# Patient Record
Sex: Female | Born: 1960
Health system: Southern US, Community
[De-identification: ages and names within clinical notes are randomized; demographics above are authoritative.]

## PROBLEM LIST (undated history)

## (undated) DIAGNOSIS — R7303 Prediabetes: Secondary | ICD-10-CM

## (undated) DIAGNOSIS — K602 Anal fissure, unspecified: Secondary | ICD-10-CM

## (undated) DIAGNOSIS — M25512 Pain in left shoulder: Secondary | ICD-10-CM

## (undated) DIAGNOSIS — E049 Nontoxic goiter, unspecified: Secondary | ICD-10-CM

## (undated) DIAGNOSIS — R011 Cardiac murmur, unspecified: Secondary | ICD-10-CM

## (undated) DIAGNOSIS — K8000 Calculus of gallbladder with acute cholecystitis without obstruction: Secondary | ICD-10-CM

## (undated) DIAGNOSIS — D649 Anemia, unspecified: Secondary | ICD-10-CM

## (undated) DIAGNOSIS — J189 Pneumonia, unspecified organism: Secondary | ICD-10-CM

## (undated) DIAGNOSIS — K219 Gastro-esophageal reflux disease without esophagitis: Secondary | ICD-10-CM

## (undated) DIAGNOSIS — K8020 Calculus of gallbladder without cholecystitis without obstruction: Secondary | ICD-10-CM

## (undated) DIAGNOSIS — G709 Myoneural disorder, unspecified: Secondary | ICD-10-CM

## (undated) DIAGNOSIS — M1990 Unspecified osteoarthritis, unspecified site: Secondary | ICD-10-CM

## (undated) DIAGNOSIS — J45909 Unspecified asthma, uncomplicated: Secondary | ICD-10-CM

## (undated) DIAGNOSIS — G4730 Sleep apnea, unspecified: Secondary | ICD-10-CM

## (undated) DIAGNOSIS — E162 Hypoglycemia, unspecified: Secondary | ICD-10-CM

## (undated) DIAGNOSIS — F988 Other specified behavioral and emotional disorders with onset usually occurring in childhood and adolescence: Secondary | ICD-10-CM

## (undated) DIAGNOSIS — I10 Essential (primary) hypertension: Secondary | ICD-10-CM

## (undated) DIAGNOSIS — R0600 Dyspnea, unspecified: Secondary | ICD-10-CM

## (undated) DIAGNOSIS — G40309 Generalized idiopathic epilepsy and epileptic syndromes, not intractable, without status epilepticus: Secondary | ICD-10-CM

## (undated) DIAGNOSIS — T8859XA Other complications of anesthesia, initial encounter: Secondary | ICD-10-CM

## (undated) DIAGNOSIS — K635 Polyp of colon: Secondary | ICD-10-CM

## (undated) DIAGNOSIS — R569 Unspecified convulsions: Secondary | ICD-10-CM

## (undated) DIAGNOSIS — G8929 Other chronic pain: Secondary | ICD-10-CM

## (undated) HISTORY — DX: Anemia, unspecified: D64.9

## (undated) HISTORY — PX: TOTAL SHOULDER REPLACEMENT: SUR1217

## (undated) HISTORY — DX: Hypoglycemia, unspecified: E16.2

## (undated) HISTORY — DX: Unspecified asthma, uncomplicated: J45.909

## (undated) HISTORY — DX: Unspecified osteoarthritis, unspecified site: M19.90

## (undated) HISTORY — DX: Nontoxic goiter, unspecified: E04.9

## (undated) HISTORY — DX: Dyspnea, unspecified: R06.00

## (undated) HISTORY — DX: Sleep apnea, unspecified: G47.30

## (undated) HISTORY — DX: Prediabetes: R73.03

## (undated) HISTORY — DX: Essential (primary) hypertension: I10

## (undated) HISTORY — DX: Unspecified convulsions: R56.9

## (undated) HISTORY — DX: Calculus of gallbladder without cholecystitis without obstruction: K80.20

## (undated) HISTORY — DX: Polyp of colon: K63.5

## (undated) HISTORY — PX: GASTRIC BYPASS: SHX52

## (undated) HISTORY — PX: ABDOMINAL HYSTERECTOMY: SHX81

## (undated) HISTORY — DX: Cardiac murmur, unspecified: R01.1

## (undated) HISTORY — PX: OTHER SURGICAL HISTORY: SHX169

## (undated) HISTORY — DX: Pneumonia, unspecified organism: J18.9

## (undated) HISTORY — DX: Anal fissure, unspecified: K60.2

## (undated) HISTORY — PX: BREAST ENHANCEMENT SURGERY: SHX7

---

## 1998-09-04 ENCOUNTER — Other Ambulatory Visit: Admission: RE | Admit: 1998-09-04 | Discharge: 1998-09-04 | Payer: Self-pay | Admitting: Gynecology

## 1998-11-27 ENCOUNTER — Other Ambulatory Visit: Admission: RE | Admit: 1998-11-27 | Discharge: 1998-11-27 | Payer: Self-pay | Admitting: Gynecology

## 1998-12-14 HISTORY — PX: GASTRIC BYPASS: SHX52

## 1999-05-26 ENCOUNTER — Other Ambulatory Visit: Admission: RE | Admit: 1999-05-26 | Discharge: 1999-05-26 | Payer: Self-pay | Admitting: Gynecology

## 1999-09-03 ENCOUNTER — Ambulatory Visit (HOSPITAL_COMMUNITY): Admission: RE | Admit: 1999-09-03 | Discharge: 1999-09-03 | Payer: Self-pay | Admitting: Internal Medicine

## 1999-09-03 ENCOUNTER — Encounter: Payer: Self-pay | Admitting: Internal Medicine

## 1999-10-13 ENCOUNTER — Encounter: Admission: RE | Admit: 1999-10-13 | Discharge: 1999-10-13 | Payer: Self-pay | Admitting: Gynecology

## 1999-10-13 ENCOUNTER — Encounter: Payer: Self-pay | Admitting: Gynecology

## 2000-01-01 ENCOUNTER — Other Ambulatory Visit: Admission: RE | Admit: 2000-01-01 | Discharge: 2000-01-01 | Payer: Self-pay | Admitting: Gynecology

## 2000-10-13 ENCOUNTER — Encounter: Payer: Self-pay | Admitting: Gynecology

## 2000-10-13 ENCOUNTER — Encounter: Admission: RE | Admit: 2000-10-13 | Discharge: 2000-10-13 | Payer: Self-pay | Admitting: Gynecology

## 2001-01-17 ENCOUNTER — Other Ambulatory Visit: Admission: RE | Admit: 2001-01-17 | Discharge: 2001-01-17 | Payer: Self-pay | Admitting: Gynecology

## 2001-02-16 ENCOUNTER — Other Ambulatory Visit: Admission: RE | Admit: 2001-02-16 | Discharge: 2001-02-16 | Payer: Self-pay | Admitting: Gynecology

## 2001-02-16 ENCOUNTER — Encounter (INDEPENDENT_AMBULATORY_CARE_PROVIDER_SITE_OTHER): Payer: Self-pay

## 2001-05-02 ENCOUNTER — Other Ambulatory Visit: Admission: RE | Admit: 2001-05-02 | Discharge: 2001-05-02 | Payer: Self-pay | Admitting: Gynecology

## 2001-06-20 ENCOUNTER — Ambulatory Visit (HOSPITAL_COMMUNITY): Admission: RE | Admit: 2001-06-20 | Discharge: 2001-06-20 | Payer: Self-pay | Admitting: Gynecology

## 2001-06-20 ENCOUNTER — Encounter (INDEPENDENT_AMBULATORY_CARE_PROVIDER_SITE_OTHER): Payer: Self-pay | Admitting: *Deleted

## 2001-08-17 ENCOUNTER — Other Ambulatory Visit: Admission: RE | Admit: 2001-08-17 | Discharge: 2001-08-17 | Payer: Self-pay | Admitting: Gynecology

## 2001-08-30 ENCOUNTER — Ambulatory Visit (HOSPITAL_COMMUNITY): Admission: RE | Admit: 2001-08-30 | Discharge: 2001-08-30 | Payer: Self-pay | Admitting: Internal Medicine

## 2001-09-08 ENCOUNTER — Encounter: Admission: RE | Admit: 2001-09-08 | Discharge: 2001-09-08 | Payer: Self-pay | Admitting: Internal Medicine

## 2001-09-08 ENCOUNTER — Encounter: Payer: Self-pay | Admitting: Internal Medicine

## 2001-10-17 ENCOUNTER — Encounter: Payer: Self-pay | Admitting: Surgery

## 2001-10-17 ENCOUNTER — Encounter (INDEPENDENT_AMBULATORY_CARE_PROVIDER_SITE_OTHER): Payer: Self-pay | Admitting: Specialist

## 2001-10-17 ENCOUNTER — Ambulatory Visit (HOSPITAL_COMMUNITY): Admission: RE | Admit: 2001-10-17 | Discharge: 2001-10-17 | Payer: Self-pay | Admitting: Surgery

## 2001-10-28 ENCOUNTER — Encounter: Admission: RE | Admit: 2001-10-28 | Discharge: 2001-10-28 | Payer: Self-pay | Admitting: Gynecology

## 2001-10-28 ENCOUNTER — Encounter: Payer: Self-pay | Admitting: Gynecology

## 2001-12-27 ENCOUNTER — Other Ambulatory Visit: Admission: RE | Admit: 2001-12-27 | Discharge: 2001-12-27 | Payer: Self-pay | Admitting: Gynecology

## 2002-02-22 ENCOUNTER — Observation Stay (HOSPITAL_COMMUNITY): Admission: RE | Admit: 2002-02-22 | Discharge: 2002-02-23 | Payer: Self-pay | Admitting: Gynecology

## 2002-02-22 ENCOUNTER — Encounter (INDEPENDENT_AMBULATORY_CARE_PROVIDER_SITE_OTHER): Payer: Self-pay | Admitting: Specialist

## 2005-07-14 ENCOUNTER — Emergency Department (HOSPITAL_COMMUNITY): Admission: EM | Admit: 2005-07-14 | Discharge: 2005-07-15 | Payer: Self-pay | Admitting: Emergency Medicine

## 2005-11-27 ENCOUNTER — Emergency Department (HOSPITAL_COMMUNITY): Admission: EM | Admit: 2005-11-27 | Discharge: 2005-11-27 | Payer: Self-pay | Admitting: Emergency Medicine

## 2006-01-04 ENCOUNTER — Other Ambulatory Visit: Admission: RE | Admit: 2006-01-04 | Discharge: 2006-01-04 | Payer: Self-pay | Admitting: Obstetrics and Gynecology

## 2006-01-04 ENCOUNTER — Encounter: Admission: RE | Admit: 2006-01-04 | Discharge: 2006-01-04 | Payer: Self-pay | Admitting: Obstetrics and Gynecology

## 2006-06-16 ENCOUNTER — Emergency Department (HOSPITAL_COMMUNITY): Admission: EM | Admit: 2006-06-16 | Discharge: 2006-06-16 | Payer: Self-pay | Admitting: Emergency Medicine

## 2006-12-14 HISTORY — PX: OTHER SURGICAL HISTORY: SHX169

## 2006-12-22 ENCOUNTER — Encounter: Admission: RE | Admit: 2006-12-22 | Discharge: 2006-12-22 | Payer: Self-pay | Admitting: Obstetrics and Gynecology

## 2007-05-17 ENCOUNTER — Ambulatory Visit (HOSPITAL_COMMUNITY): Admission: RE | Admit: 2007-05-17 | Discharge: 2007-05-17 | Payer: Self-pay | Admitting: Internal Medicine

## 2008-12-20 ENCOUNTER — Encounter: Admission: RE | Admit: 2008-12-20 | Discharge: 2008-12-20 | Payer: Self-pay | Admitting: Obstetrics and Gynecology

## 2009-03-11 ENCOUNTER — Emergency Department (HOSPITAL_COMMUNITY): Admission: EM | Admit: 2009-03-11 | Discharge: 2009-03-11 | Payer: Self-pay | Admitting: Emergency Medicine

## 2009-03-20 ENCOUNTER — Ambulatory Visit (HOSPITAL_COMMUNITY): Admission: RE | Admit: 2009-03-20 | Discharge: 2009-03-20 | Payer: Self-pay | Admitting: Emergency Medicine

## 2010-01-08 ENCOUNTER — Encounter: Admission: RE | Admit: 2010-01-08 | Discharge: 2010-01-08 | Payer: Self-pay | Admitting: Diagnostic Neuroimaging

## 2010-01-09 ENCOUNTER — Encounter: Admission: RE | Admit: 2010-01-09 | Discharge: 2010-01-09 | Payer: Self-pay | Admitting: Obstetrics and Gynecology

## 2010-06-25 ENCOUNTER — Ambulatory Visit (HOSPITAL_BASED_OUTPATIENT_CLINIC_OR_DEPARTMENT_OTHER): Admission: RE | Admit: 2010-06-25 | Discharge: 2010-06-25 | Payer: Self-pay | Admitting: Orthopedic Surgery

## 2010-09-01 ENCOUNTER — Inpatient Hospital Stay (HOSPITAL_COMMUNITY): Admission: RE | Admit: 2010-09-01 | Discharge: 2010-09-03 | Payer: Self-pay | Admitting: Orthopedic Surgery

## 2010-09-22 ENCOUNTER — Emergency Department (HOSPITAL_COMMUNITY): Admission: EM | Admit: 2010-09-22 | Discharge: 2010-09-22 | Payer: Self-pay | Admitting: Emergency Medicine

## 2010-11-10 ENCOUNTER — Encounter: Payer: Self-pay | Admitting: Gastroenterology

## 2010-11-13 ENCOUNTER — Encounter (INDEPENDENT_AMBULATORY_CARE_PROVIDER_SITE_OTHER): Payer: Self-pay | Admitting: *Deleted

## 2010-11-13 ENCOUNTER — Encounter: Admission: RE | Admit: 2010-11-13 | Discharge: 2010-11-13 | Payer: Self-pay | Admitting: Orthopedic Surgery

## 2010-11-28 ENCOUNTER — Encounter (INDEPENDENT_AMBULATORY_CARE_PROVIDER_SITE_OTHER): Payer: Self-pay | Admitting: *Deleted

## 2010-12-02 ENCOUNTER — Ambulatory Visit: Payer: Self-pay | Admitting: Gastroenterology

## 2010-12-24 ENCOUNTER — Telehealth (INDEPENDENT_AMBULATORY_CARE_PROVIDER_SITE_OTHER): Payer: Self-pay | Admitting: *Deleted

## 2010-12-26 ENCOUNTER — Encounter: Payer: Self-pay | Admitting: Gastroenterology

## 2010-12-26 ENCOUNTER — Ambulatory Visit
Admission: RE | Admit: 2010-12-26 | Discharge: 2010-12-26 | Payer: Self-pay | Source: Home / Self Care | Attending: Gastroenterology | Admitting: Gastroenterology

## 2010-12-29 LAB — GLUCOSE, CAPILLARY
Glucose-Capillary: 94 mg/dL (ref 70–99)
Glucose-Capillary: 124 mg/dL — ABNORMAL HIGH (ref 70–99)
Glucose-Capillary: 90 mg/dL (ref 70–99)

## 2011-01-01 ENCOUNTER — Encounter: Payer: Self-pay | Admitting: Gastroenterology

## 2011-01-04 ENCOUNTER — Encounter: Payer: Self-pay | Admitting: Orthopedic Surgery

## 2011-01-13 NOTE — Letter (Signed)
Summary: Pre Visit Letter Revised  [facility]  [place], [place] [address]   Phone: [phone]  Fax: [phone]        11/13/2010 MRN: [id number] [facility] [place],   [address]             Procedure Date:  12-26-10   Welcome to the Gastroenterology Division at [facility].    You are scheduled to see a nurse for your pre-procedure visit on 12-11-10 at 11:00a.m. on the 3rd floor at [facility], [place] [employer].  We ask that you try to arrive at our office 15 minutes prior to your appointment time to allow for check-in.  Please take a minute to review the attached form.  If you answer "Yes" to one or more of the questions on the first page, we ask that you call the person listed at your earliest opportunity.  If you answer "No" to all of the questions, please complete the rest of the form and bring it to your appointment.    Your nurse visit will consist of discussing your medical and surgical history, your immediate family medical history, and your medications.   If you are unable to list all of your medications on the form, please bring the medication bottles to your appointment and we will list them.  We will need to be aware of both prescribed and over the counter drugs.  We will need to know exact dosage information as well.    Please be prepared to read and sign documents such as consent forms, a financial agreement, and acknowledgement forms.  If necessary, and with your consent, a friend or relative is welcome to sit-in on the nurse visit with you.  Please bring your insurance card so that we may make a copy of it.  If your insurance requires a referral to see a specialist, please bring your referral form from your primary care physician.  No co-pay is required for this nurse visit.     If you cannot keep your appointment, please call [phone] to cancel or reschedule prior to your appointment date.  This  allows [place] the opportunity to schedule an appointment for another patient in need of care.    Thank you for choosing [facility] for your medical needs.  We appreciate the opportunity to care for you.  Please visit [place] at our website  to learn more about our practice.  Sincerely, The Gastroenterology Division

## 2011-01-15 NOTE — Progress Notes (Signed)
Summary: Questions before her procedure  Phone Note Call from Patient Call back at [phone]   Caller: Patient Call For: Dr. Deatra Ina Reason for Call: Talk to Nurse Summary of Call: Has a questions about getting her glucose wafers..COL sch'd on 12-26-10 Initial call taken by: Webb Laws,  December 24, 2010 10:22 AM  Follow-up for Phone Call        Phone Call Completed Follow-up by: Emerson Monte RN,  December 24, 2010 10:34 AM

## 2011-01-15 NOTE — Letter (Signed)
Summary: [facility] Instructions  [facility]  [place], [place] [address]   Phone: [phone]  Fax: [phone]       Emily Graham    1961-01-19    MRN: [id number]        Procedure Day /Date: Friday 12-26-10     Arrival Time: 9:00 am     Procedure Time: 10:00 am     Location of Procedure:                    _x _  Tea (4th Floor)                        [place]   Starting 5 days prior to your procedure  12-21-10 do not eat nuts, seeds, popcorn, corn, beans, peas,  salads, or any raw vegetables.  Do not take any fiber supplements (e.g. Metamucil, Citrucel, and Benefiber).  THE DAY BEFORE YOUR PROCEDURE         DATE:  12-25-10  DAY:  Thursday   1.  Drink clear liquids the entire day-NO SOLID FOOD  2.  Do not drink anything colored red or purple.  Avoid juices with pulp.  No orange juice.  3.  Drink at least 64 oz. (8 glasses) of fluid/clear liquids during the day to prevent dehydration and help the prep work efficiently.  CLEAR LIQUIDS INCLUDE: Water Jello Ice Popsicles Tea (sugar ok, no milk/cream) Powdered fruit flavored drinks Coffee (sugar ok, no milk/cream) Gatorade Juice: apple, white grape, white cranberry  Lemonade Clear bullion, consomm, broth Carbonated beverages (any kind) Strained chicken noodle soup Hard Candy                             4.  In the morning, mix first dose of MoviPrep solution:    Empty 1 Pouch A and 1 Pouch B into the disposable container    Add lukewarm drinking water to the top line of the container. Mix to dissolve    Refrigerate (mixed solution should be used within 24 hrs)  5.  Begin drinking the prep at 5:00 p.m. The MoviPrep container is divided by 4 marks.   Every 15 minutes drink the solution down to the next mark (approximately 8 oz) until the full liter is complete.   6.  Follow completed prep with 16 oz of clear liquid of your choice  (Nothing red or purple).  Continue to drink clear liquids until bedtime.  7.  Before going to bed, mix second dose of MoviPrep solution:    Empty 1 Pouch A and 1 Pouch B into the disposable container    Add lukewarm drinking water to the top line of the container. Mix to dissolve    Refrigerate  THE DAY OF YOUR PROCEDURE      DATE: 12-26-10  DAY:  Friday  Beginning at  5:00 a.m. (5 hours before procedure):         1. Every 15 minutes, drink the solution down to the next mark (approx 8 oz) until the full liter is complete.  2. Follow completed prep with 16 oz. of clear liquid of your choice.    3. You may drink clear liquids until  8:00 a.m. (2 HOURS BEFORE PROCEDURE).   MEDICATION INSTRUCTIONS  Unless otherwise instructed, you should take regular prescription medications with a small sip of water  as early as possible the morning of your procedure.        OTHER INSTRUCTIONS  You will need a responsible adult at least 51 years of age to accompany you and drive you home.   This person must remain in the waiting room during your procedure.  Wear loose fitting clothing that is easily removed.  Leave jewelry and other valuables at home.  However, you may wish to bring a book to read or  an iPod/MP3 player to listen to music as you wait for your procedure to start.  Remove all body piercing jewelry and leave at home.  Total time from sign-in until discharge is approximately 2-3 hours.  You should go home directly after your procedure and rest.  You can resume normal activities the  day after your procedure.  The day of your procedure you should not:   Drive   Make legal decisions   Operate machinery   Drink alcohol   Return to work  You will receive specific instructions about eating, activities and medications before you leave.    The above instructions have been reviewed and explained to me by   Emerson Monte RN  December 02, 2010 11:15 AM     I fully  understand and can verbalize these instructions _____________________________ Date _________

## 2011-01-15 NOTE — Letter (Addendum)
Summary: Patient Notice- Polyp Results  [facility]  [address] [place], [place] [address]   Phone: [phone]  Fax: [phone]        January 01, 2011 MRN: [id number]    [facility] [place], [place]  [address]    Dear Ms. Haverstock,  I am pleased to inform you that the colon polyp(s) removed during your recent colonoscopy was (were) found to be benign (no cancer detected) upon pathologic examination.  I recommend you have a repeat colonoscopy examination in 5_ years to look for recurrent polyps, as having colon polyps increases your risk for having recurrent polyps or even colon cancer in the future.  Should you develop new or worsening symptoms of abdominal pain, bowel habit changes or bleeding from the rectum or bowels, please schedule an evaluation with either your primary care physician or with me.  Additional information/recommendations:  __ No further action with gastroenterology is needed at this time. Please      follow-up with your primary care physician for your other healthcare      needs.  __ Please call [phone] to schedule a return visit to review your      situation.  __ Please keep your follow-up visit as already scheduled.  _x_ Continue treatment plan as outlined the day of your exam.  Please call us if you are having persistent problems or have questions about your condition that have not been fully answered at this time.  Sincerely,  Inda Castle MD  This letter has been electronically signed by your physician.  Appended Document: Patient Notice- Polyp Results Letter mailed

## 2011-01-15 NOTE — Miscellaneous (Signed)
Summary: [facility] Previsit/prep  Clinical Lists Changes  Medications: Added new medication of MOVIPREP 100 GM  SOLR (PEG-KCL-NACL-NASULF-NA ASC-C) As per prep instructions. - Signed Rx of MOVIPREP 100 GM  SOLR (PEG-KCL-NACL-NASULF-NA ASC-C) As per prep instructions.;  #1 x 0;  Signed;  Entered by: Emerson Monte RN;  Authorized by: Inda Castle MD;  Method used: Electronically to [facility]. # [id number]*, [address] [place], [place], [place]  [address], Ph: [phone] or [phone], Fax: [phone] Observations: Added new observation of NKA: T (12/02/2010 10:30)    Prescriptions: MOVIPREP 100 GM  SOLR (PEG-KCL-NACL-NASULF-NA ASC-C) As per prep instructions.  #1 x 0   Entered by:   Emerson Monte RN   Authorized by:   Inda Castle MD   Signed by:   Emerson Monte RN on 12/02/2010   Method used:   Electronically to        [facility]. # [id number]* (retail)       [address]       [place], [place]  [address]       Ph: [phone] or [phone]       Fax: [phone]   RxID:   [id number]

## 2011-01-15 NOTE — Procedures (Signed)
Summary: Colonoscopy  Patient: Emily Graham Note: All result statuses are Final unless otherwise noted.  Tests: (1) Colonoscopy (COL)   COL Colonoscopy           [facility]     [place], [place]  [address]           COLONOSCOPY PROCEDURE REPORT           PATIENT:  Emily Graham, Emily Graham  MR#:  [id number]     BIRTHDATE:  1961/05/24, 49 yrs. old  GENDER:  female           ENDOSCOPIST:  Sandy Salaam. Deatra Ina, MD     Referred by:  Unk Pinto, M.D.           PROCEDURE DATE:  12/26/2010     PROCEDURE:  Colon with cold biopsy polypectomy     ASA CLASS:  Class I     INDICATIONS:  1) Routine Risk Screening           MEDICATIONS:   Fentanyl 75 mcg IV, Versed 7 mg IV           DESCRIPTION OF PROCEDURE:   After the risks benefits and     alternatives of the procedure were thoroughly explained, informed     consent was obtained.  Digital rectal exam was performed and     revealed no abnormalities.   The LB 180AL [id number] endoscope was     introduced through the anus and advanced to the cecum, which was     identified by both the appendix and ileocecal valve, without     limitations.  The quality of the prep was good, using MoviPrep.     The instrument was then slowly withdrawn as the colon was fully     examined.     <<PROCEDUREIMAGES>>           FINDINGS:  Two polyps were found. 2 1-59m polyps in ascending and     descending colon The polyp was removed using cold biopsy forceps     (see image4 and image8).  This was otherwise a normal examination     of the colon (see image2, image3, image5, image6, image9, image10,     image12, and image13).   Retroflexed views in the rectum revealed     no abnormalities.    The time to cecum =  2.50  minutes. The scope     was then withdrawn (time =  10.0  min) from the patient and the     procedure completed.           COMPLICATIONS:  None           ENDOSCOPIC IMPRESSION:     1) Diminutive  polyps     2) Otherwise  normal examination     RECOMMENDATIONS:     1) If the polyp(s) removed today are proven to be adenomatous     (pre-cancerous) polyps, you will need a repeat colonoscopy in 5     years. Otherwise you should continue to follow colorectal cancer     screening guidelines for "routine risk" patients with colonoscopy     in 10 years.           REPEAT EXAM:   You will receive a letter from Dr. KDeatra Inain 1-2     weeks, after reviewing the final pathology, with followup     recommendations.  ______________________________     Sandy Salaam Deatra Ina, MD           CC:           n.     eSIGNED:   Sandy Salaam. Alexxia Stankiewicz at 12/26/2010 10:53 AM           Page 2 of 3   Emily Graham, Emily Graham, [id number]  Note: An exclamation mark (!) indicates a result that was not dispersed into the flowsheet. Document Creation Date: 12/26/2010 10:54 AM _______________________________________________________________________  (1) Order result status: Final Collection or observation date-time: 12/26/2010 10:47 Requested date-time:  Receipt date-time:  Reported date-time:  Referring Physician:   Ordering Physician: Erskine Emery [phone]) Specimen Source:  Source: Tawanna Cooler Order Number: [id number] Lab site:   Appended Document: Colonoscopy     Procedures Next Due Date:    Colonoscopy: 12/2015

## 2011-02-25 LAB — CBC
HCT: 34.4 % — ABNORMAL LOW (ref 36.0–46.0)
MCHC: 33.3 g/dL (ref 30.0–36.0)
Platelets: 291 10*3/uL (ref 150–400)
RDW: 14.1 % (ref 11.5–15.5)
WBC: 8.1 10*3/uL (ref 4.0–10.5)

## 2011-02-25 LAB — BASIC METABOLIC PANEL
BUN: 14 mg/dL (ref 6–23)
CO2: 23 mEq/L (ref 19–32)
Calcium: 9 mg/dL (ref 8.4–10.5)
Chloride: 106 mEq/L (ref 96–112)
Creatinine, Ser: 0.73 mg/dL (ref 0.4–1.2)
GFR calc Af Amer: >60 mL/min (ref >60)
GFR calc non Af Amer: >60 mL/min (ref >60)
Glucose, Bld: 113 mg/dL — ABNORMAL HIGH (ref 70–99)
Potassium: 3.7 mEq/L (ref 3.5–5.1)
Sodium: 140 mEq/L (ref 135–145)

## 2011-02-25 LAB — DIFFERENTIAL
Basophils Absolute: 0 10*3/uL (ref 0.0–0.1)
Basophils Relative: 1 % (ref 0–1)
Eosinophils Relative: 2 % (ref 0–5)
Lymphocytes Relative: 13 % (ref 12–46)
Monocytes Absolute: 0.8 10*3/uL (ref 0.1–1.0)
Neutro Abs: 6 10*3/uL (ref 1.7–7.7)

## 2011-02-25 LAB — URINALYSIS, ROUTINE W REFLEX MICROSCOPIC
Glucose, UA: >1000 mg/dL — AB
Nitrite: NEGATIVE
Protein, ur: NEGATIVE mg/dL
Specific Gravity, Urine: 1.032 — ABNORMAL HIGH (ref 1.005–1.030)
Urobilinogen, UA: 0.2 mg/dL (ref 0.0–1.0)
pH: 5 (ref 5.0–8.0)

## 2011-02-25 LAB — URINE MICROSCOPIC-ADD ON

## 2011-02-25 LAB — GLUCOSE, CAPILLARY: Glucose-Capillary: 98 mg/dL (ref 70–99)

## 2011-02-26 LAB — CBC
HCT: 45.6 % (ref 36.0–46.0)
Hemoglobin: 10.6 g/dL — ABNORMAL LOW (ref 12.0–15.0)
MCH: 29 pg (ref 26.0–34.0)
MCHC: 33.1 g/dL (ref 30.0–36.0)
MCV: 88.5 fL (ref 78.0–100.0)
Platelets: 197 10*3/uL (ref 150–400)
RBC: 3.65 MIL/uL — ABNORMAL LOW (ref 3.87–5.11)
RDW: 13.5 % (ref 11.5–15.5)
WBC: 8.2 10*3/uL (ref 4.0–10.5)

## 2011-02-26 LAB — COMPREHENSIVE METABOLIC PANEL
ALT: 38 U/L — ABNORMAL HIGH (ref 0–35)
AST: 33 U/L (ref 0–37)
Albumin: 4.5 g/dL (ref 3.5–5.2)
Alkaline Phosphatase: 87 U/L (ref 39–117)
Calcium: 9.8 mg/dL (ref 8.4–10.5)
GFR calc Af Amer: >60 mL/min (ref >60)
Potassium: 4.8 mEq/L (ref 3.5–5.1)
Sodium: 140 mEq/L (ref 135–145)
Total Protein: 7.3 g/dL (ref 6.0–8.3)

## 2011-02-26 LAB — GLUCOSE, CAPILLARY
Glucose-Capillary: 121 mg/dL — ABNORMAL HIGH (ref 70–99)
Glucose-Capillary: 122 mg/dL — ABNORMAL HIGH (ref 70–99)
Glucose-Capillary: 134 mg/dL — ABNORMAL HIGH (ref 70–99)
Glucose-Capillary: 213 mg/dL — ABNORMAL HIGH (ref 70–99)
Glucose-Capillary: 85 mg/dL (ref 70–99)
Glucose-Capillary: 90 mg/dL (ref 70–99)
Glucose-Capillary: 95 mg/dL (ref 70–99)

## 2011-02-26 LAB — PROTIME-INR: INR: 0.97 (ref 0.00–1.49)

## 2011-02-26 LAB — URINE MICROSCOPIC-ADD ON

## 2011-02-26 LAB — TYPE AND SCREEN: ABO/RH(D): A POS

## 2011-02-26 LAB — URINALYSIS, ROUTINE W REFLEX MICROSCOPIC
Glucose, UA: 100 mg/dL — AB
Specific Gravity, Urine: 1.024 (ref 1.005–1.030)
Urobilinogen, UA: 0.2 mg/dL (ref 0.0–1.0)
pH: 6 (ref 5.0–8.0)

## 2011-02-26 LAB — DIFFERENTIAL
Basophils Relative: 0 % (ref 0–1)
Eosinophils Absolute: 0.3 10*3/uL (ref 0.0–0.7)
Eosinophils Relative: 3 % (ref 0–5)
Lymphs Abs: 1.5 10*3/uL (ref 0.7–4.0)
Monocytes Absolute: 0.8 10*3/uL (ref 0.1–1.0)
Monocytes Relative: 10 % (ref 3–12)

## 2011-03-26 LAB — DIFFERENTIAL
Basophils Absolute: 0 10*3/uL (ref 0.0–0.1)
Basophils Relative: 0 % (ref 0–1)
Eosinophils Absolute: 0.3 10*3/uL (ref 0.0–0.7)
Eosinophils Relative: 4 % (ref 0–5)

## 2011-03-26 LAB — CBC
MCV: 86.9 fL (ref 78.0–100.0)
RBC: 4.72 MIL/uL (ref 3.87–5.11)
WBC: 7.2 10*3/uL (ref 4.0–10.5)

## 2011-03-26 LAB — COMPREHENSIVE METABOLIC PANEL
ALT: 24 U/L (ref 0–35)
AST: 26 U/L (ref 0–37)
CO2: 24 mEq/L (ref 19–32)
Chloride: 109 mEq/L (ref 96–112)
GFR calc Af Amer: >60 mL/min (ref >60)
GFR calc non Af Amer: >60 mL/min (ref >60)
Sodium: 141 mEq/L (ref 135–145)
Total Bilirubin: 0.6 mg/dL (ref 0.3–1.2)

## 2011-03-26 LAB — GLUCOSE, CAPILLARY

## 2011-03-26 LAB — MAGNESIUM: Magnesium: 2.5 mg/dL (ref 1.5–2.5)

## 2011-04-24 ENCOUNTER — Other Ambulatory Visit: Payer: Self-pay | Admitting: Internal Medicine

## 2011-04-24 DIAGNOSIS — R7989 Other specified abnormal findings of blood chemistry: Secondary | ICD-10-CM

## 2011-04-28 NOTE — Procedures (Signed)
EEG IDENTIFICATION NUMBER:  [id number].   REFERRING PHYSICIAN:  Unk Pinto, M.D. and Rolland Porter, M.D.   CLINICAL HISTORY:  A 50 year old lady being evaluated for episodes of  staring and shaking.  She has history of 2 prior seizure.   MEDICATIONS LISTED:  None.   This is a 17-channel EEG recorded with the patient awake and drowsy  using standard 10/20 electrode placement.   Background awake rhythm consists of 10-11 Hz alpha, which is of moderate  amplitude, synchronous, reactive to eye opening and closure.  No  definite sleep stages are noted.  The recording is suboptimal with  accessory muscle movement artifact throughout the tracing.  Hyperventilation and photic stimulation both unremarkable.  Length of  the recording is 21.6 minutes.  Technical component is average.  EKG  tracing reveals regular sinus rhythm.   IMPRESSION:  This EEG performed during awake and drowsy state, is within  normal limits.  No definite epileptiform features were noted.           ______________________________  Kathie Rhodes. Leonie Man, MD     [id number]  D:  03/20/2009 19:45:16  T:  03/21/2009 10:06:31  Job #:  [id number]   cc:   Unk Pinto, M.D.  Fax: [phone]

## 2011-04-29 ENCOUNTER — Ambulatory Visit
Admission: RE | Admit: 2011-04-29 | Discharge: 2011-04-29 | Disposition: A | Discharge status: Home / Self Care | Payer: PRIVATE HEALTH INSURANCE | Source: Ambulatory Visit | Attending: Internal Medicine | Admitting: Internal Medicine

## 2011-04-29 DIAGNOSIS — R7989 Other specified abnormal findings of blood chemistry: Secondary | ICD-10-CM

## 2011-05-01 NOTE — H&P (Signed)
[facility]  Patient:    Emily Graham, Emily Graham Visit Number: [id number] MRN: [id number]          Service Type: Attending:  Selinda Orion, M.D. Dictated by:   Selinda Orion, M.D. Adm. Date:  02/22/02   CC:         [name], M.D.   History and Physical  CHIEF COMPLAINT:  Recurrent abnormal genital cytology after biopsy x 2.  HISTORY OF PRESENT ILLNESS:  The patient is a 50 year old gravida 2, para 2, with a history of recurring abnormal genital cytology with persistence of disease now for more than a year.  She has had LEEP excision of the transformation zone, cone biopsy x 2 with negative margins but with recurrence of dysplasia on each occasion.  She also has extreme and intense PMS unresponsive to conservative management.  She is now admitted for definitive therapy by hysterectomy by LAVH salpingo-oophorectomy.  She has a history of gastric bypass.  She is down almost 130 pounds since her preoperative weight.  She also has a recent diagnosis of seizure disorder and is reasonably controlled on Dilantin.  She is now admitted for definitive therapy.  PAST MEDICAL HISTORY: 1. Usual childhood diseases without sequelae. 2. Seizure disorder as above. 3. Borderline type 2 diabetes preoperatively, now controlled without therapy.  PAST SURGICAL HISTORY: 1. Mini gastric bypass, Dr. Debroah Loop. 2. History of tubal sterilization, cautery type, unknown position. 3. Shoulder surgery 1983, Dr. Eulas Post. 4. Vaginal delivery x 2 without complications. 5. LEEP cone x 2.  PRESENT MEDICATIONS: 1. Ortho Evra. 2. Dilantin 400 mg daily. 3. Multivitamin daily.  ALLERGIES:  None known.  REVIEW OF SYSTEMS:  HEENT:  Denies symptoms.  CARDIORESPIRATORY:  Denies asthma, cough, bronchitis, shortness of breath.  GASTROINTESTINAL, GENITOURINARY:  Denies frequency, urgency, dysuria, change in bowel habits, food intolerance.  SOCIAL HISTORY:  The patients first  husband died 05/01/2000.  She is now remarried.  She has two children and two homes, one in [place] and one here. She has had considerable stress over her seizure disorder and multiple cosmetic surgeries recently including augmentation mammoplasty, tummy-tuck procedure, mini gastric bypass, etc.  She has recently had one of the breast augmentations removed.  The other is considerably larger than is appropriate for her size.  PHYSICAL EXAMINATION:  GENERAL:  Well-developed, thin white female.  HEENT:  Pupils equal and reactive to light and accommodation.  Fundi not examined.  Oropharynx clear.  NECK:  Supple.  Without mass or thyroid enlargement.  BREASTS:  Left disfigured breast secondary to removal of the prosthesis and recent infection.  The right breast is considerably larger than is appropriate for her chest wall size and is essentially covered by only skin because of her upper body wasting.  There are no masses, no adenopathy.  ABDOMEN:  Abdominoplasty is pulled very tightly, and she has virtually no abdominal wall fat.  The incisions are healed primarily.  HEART:  Regular rhythm, without murmur or cardiac enlargement.  PELVIC:  External genitalia normal female.  Vaginal clean, rugose.  Cervix is parous, clean.  Uterus normal size, shape, contour.  Adnexa clear.  RECTOVAGINAL:  Confirms.  EXTREMITIES:  Lower extremities negative.  NEUROLOGIC:  Physiologic.  IMPRESSION: 1. Persistent abnormal genital cytology with recurrence after loop    electrosurgical excision procedure and cone x2. 2. Seizure disorder, on Dilantin therapy. 3. Extreme postmenstrual syndrome without relief with conservative measures. 4. Multiple cosmetic procedures as above.  PLAN:  Laparoscopic-assisted hysterectomy, salpingo-oophorectomy.  I have discussed her severe PMS.  She wishes relief by any means.  I have also discussed all of the options for treatment of her abnormal cytology  including continued conservative management.  She wishes definitive therapy now. Dictated by:   Selinda Orion, M.D. Attending:  Selinda Orion, M.D. DD:  02/23/02 TD:  02/24/02 Job: [id number]

## 2011-05-01 NOTE — Op Note (Signed)
[facility]  Patient:    Emily Graham, Emily Graham Visit Number: [id number] MRN: [id number]          Service Type: SUR Location: [place] [facility] Attending Physician:  Emily Graham Dictated by:   Emily Graham, M.D. Proc. Date: 02/22/02 Admit Date:  02/22/2002   CC:         Emily Graham, M.D.   Operative Report  PREOPERATIVE DIAGNOSES: 1. Recurrent abnormal genital cytology x3 with history of cone biopsy x2. 2. Severe, extreme premenstrual syndrome.  POSTOPERATIVE DIAGNOSES: 1. Recurrent abnormal genital cytology x3 with history of cone biopsy x2. 2. Severe, extreme premenstrual syndrome.  PROCEDURE:  Laparoscopically assisted vaginal hysterectomy, bilateral salpingo-oophorectomy.  SURGEON:  Emily Graham, M.D.  ASSISTANT:  Emily Graham, M.D.  ANESTHESIA:  General orotracheal.  DESCRIPTION OF PROCEDURE:  Under excellent anesthesia as above with patient prepped and draped in [place] in modified lithotomy, a subumbilical incision was made lateral to the midline.  She had previously had an abdominoplasty with mobilization of her umbilicus, which was placed somewhat high in the abdomen.  At this point the adequate pneumoperitoneum was obtained and the laparoscope trocar introduced lateral to the midline and low.  The placement was clean.  There were no complications.  Examination of the pelvis revealed a tubal cautery procedure previously Graham with extreme thermal injury to the tube.  Most of the fallopian tube was desiccated and absent all the way into the cornual areas of the uterus.  There was no evidence of leakage from the uterus itself and no evidence of significant endometriosis.  Both ovaries appeared polycystic and inactive without evidence of recent ovulation.  The lateral pedicles were then grasped through instruments placed through accessory ports under direct vision.  The tripolar forceps were then used to cauterize the  infundibulopelvic ligaments, round ligaments, and the lateral structures down to uterus to the level of the uterine.  At this point the upper portion of the procedure was abandoned and the procedure continued below.  The patient was repositioned and the cervix grasped with a single-tooth tenaculum and pulled down into view.  The mucosa was then circumcised and pushed off the lower segment.  The cul-de-sac was then entered and the speculum placed on the cuff for retraction and hemostasis.  The uterosacral ligaments were then clamped, cut, sutured, and tied with 0 Vicryl. Cardinal ligaments were then likewise clamped, cut, sutured, and tied with 0 Vicryl.  The uterine vessels were then skeletonized, clamped, cut, sutured, and tied with 0 Vicryl.  The pedicles were then communicated from below to the previous dissection above and a clamp placed across the intervening tissue using a straight Masterson.  At this point the bleeding was well-controlled, the uterus delivered through the cul-de-sac along with the tubes and ovaries, residual.  The peritoneum was then closed with a running suture of 0 Monocryl. The peritoneum was pursestringed 360 degrees around.  A cardinal-uterosacral complex colposuspension was then performed so as to support the apex of the vagina laterally on both sides and to prevent subsequent level 1 support deficiency.  The cuff was then closed with a running suture of 2-0 Vicryl.  At this point the attention was returned to the abdominal portion of the procedure.  The abdomen was reinflated and the telescope placed back in the abdomen.  One small area of bleeding from the right infundibulopelvic vessels was cauterized by bipolar cautery.  The pelvis was irrigated.  The pelvis and abdomen  were irrigated to remove clot and debris.  The abdominoperitoneum then was filled with approximately 500 cc of lactated Ringers to float the bowel out of the pelvis.  At this point the  procedure was terminated without complication.  The incisions closed with a deep suture of 5-0 Dexon, skin closure with Steri-Strips.  No complications.  The patient returned to the recovery room in excellent condition. Dictated by:   Emily Graham, M.D. Attending Physician:  Emily Graham DD:  02/22/02 TD:  02/23/02 Job: [id number]

## 2011-09-10 IMAGING — CR DG SHOULDER 2+V PORT*R*
2 series · 2 of 2 positions shown · non-contrast
Comparison: None.

CLINICAL DATA: Postop right shoulder arthroplasty.

PORTABLE RIGHT SHOULDER - 2+ VIEW

[AP (1 of 2)]
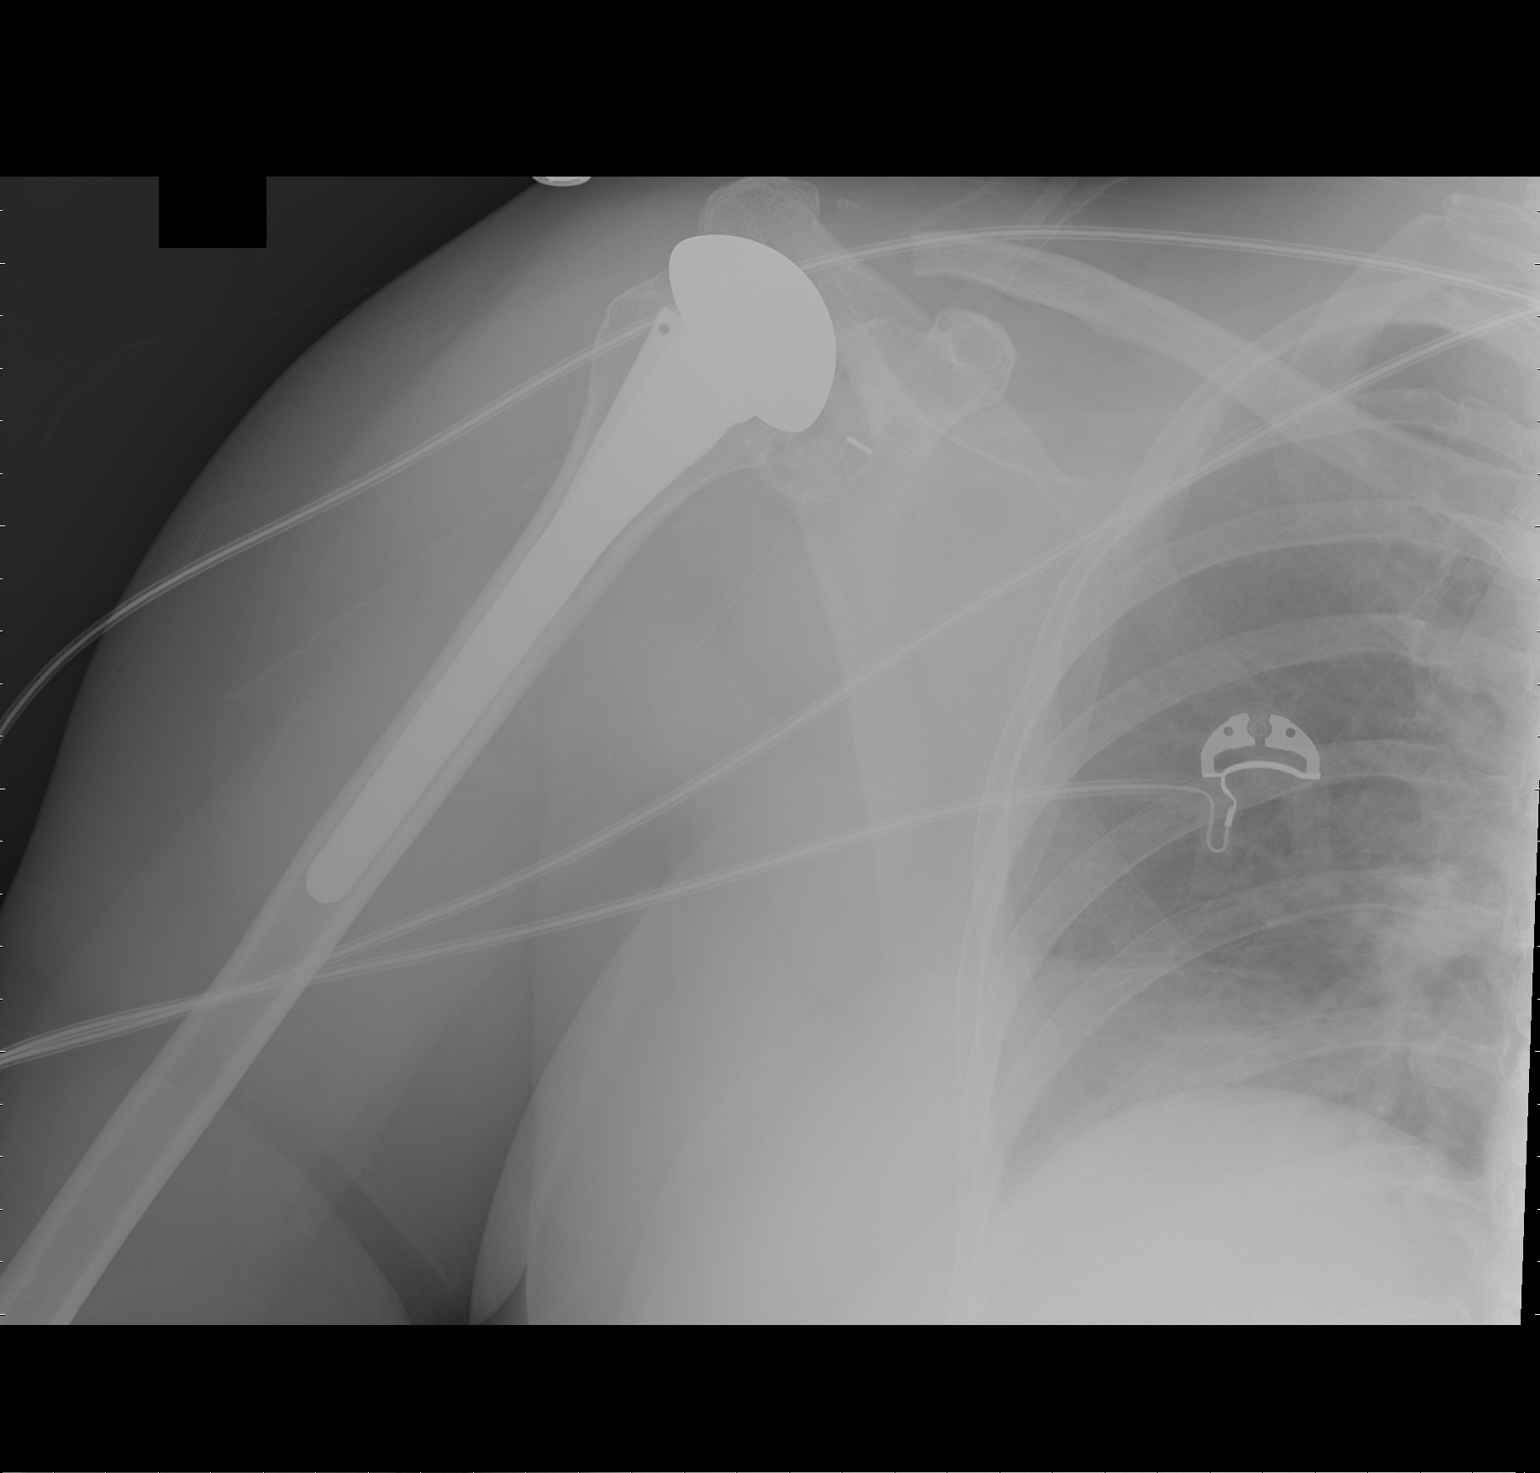

[AP (2 of 2)]
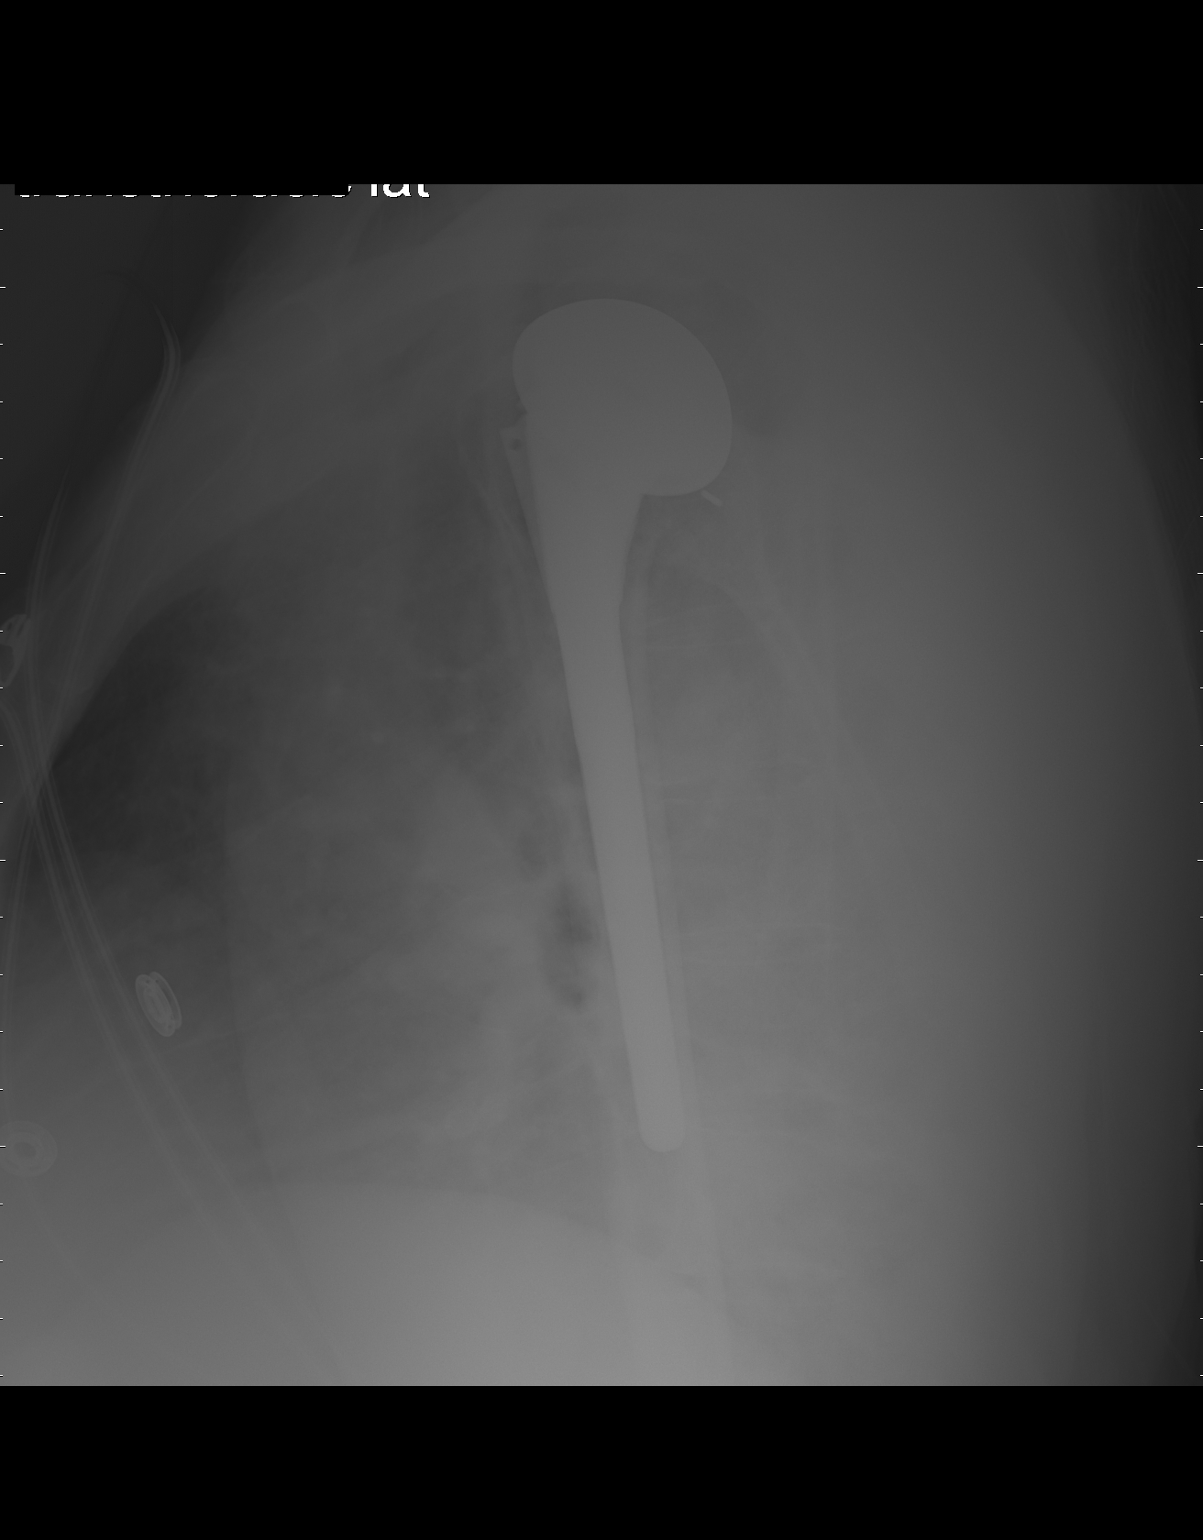

[2 of 2 positions shown; findings below may reference images not displayed]

FINDINGS: Patient is status post right total shoulder arthroplasty
and distal clavicle resection.  There is no evidence of acute
fracture or dislocation.  Some atelectasis is noted at the right
lung base.
IMPRESSION: Postop right total shoulder replacement without demonstrated
complication.

## 2011-10-04 ENCOUNTER — Emergency Department (HOSPITAL_COMMUNITY)
Admission: EM | Admit: 2011-10-04 | Discharge: 2011-10-04 | Disposition: A | Discharge status: Home / Self Care | Payer: PRIVATE HEALTH INSURANCE | Attending: Emergency Medicine | Admitting: Emergency Medicine

## 2011-10-04 DIAGNOSIS — R4182 Altered mental status, unspecified: Secondary | ICD-10-CM | POA: Insufficient documentation

## 2011-10-04 DIAGNOSIS — G40909 Epilepsy, unspecified, not intractable, without status epilepticus: Secondary | ICD-10-CM | POA: Insufficient documentation

## 2011-10-04 LAB — POCT I-STAT, CHEM 8
Creatinine, Ser: 0.7 mg/dL (ref 0.50–1.10)
Glucose, Bld: 94 mg/dL (ref 70–99)
Hemoglobin: 15 g/dL (ref 12.0–15.0)
TCO2: 20 mmol/L (ref 0–100)

## 2011-12-15 DIAGNOSIS — R569 Unspecified convulsions: Secondary | ICD-10-CM

## 2011-12-15 HISTORY — DX: Unspecified convulsions: R56.9

## 2012-01-06 ENCOUNTER — Other Ambulatory Visit: Payer: Self-pay | Admitting: Obstetrics and Gynecology

## 2012-01-06 DIAGNOSIS — Z1231 Encounter for screening mammogram for malignant neoplasm of breast: Secondary | ICD-10-CM

## 2012-01-29 ENCOUNTER — Ambulatory Visit
Admission: RE | Admit: 2012-01-29 | Discharge: 2012-01-29 | Disposition: A | Discharge status: Home / Self Care | Payer: PRIVATE HEALTH INSURANCE | Source: Ambulatory Visit | Attending: Obstetrics and Gynecology | Admitting: Obstetrics and Gynecology

## 2012-01-29 DIAGNOSIS — Z1231 Encounter for screening mammogram for malignant neoplasm of breast: Secondary | ICD-10-CM

## 2012-09-15 ENCOUNTER — Encounter: Payer: Self-pay | Admitting: Cardiology

## 2012-09-15 ENCOUNTER — Encounter: Payer: Self-pay | Admitting: *Deleted

## 2012-09-15 DIAGNOSIS — I10 Essential (primary) hypertension: Secondary | ICD-10-CM | POA: Insufficient documentation

## 2012-09-15 DIAGNOSIS — E785 Hyperlipidemia, unspecified: Secondary | ICD-10-CM | POA: Insufficient documentation

## 2012-09-16 ENCOUNTER — Encounter: Payer: Self-pay | Admitting: Cardiology

## 2012-09-16 ENCOUNTER — Ambulatory Visit (INDEPENDENT_AMBULATORY_CARE_PROVIDER_SITE_OTHER): Payer: PRIVATE HEALTH INSURANCE | Admitting: Cardiology

## 2012-09-16 ENCOUNTER — Ambulatory Visit (INDEPENDENT_AMBULATORY_CARE_PROVIDER_SITE_OTHER)
Admission: RE | Admit: 2012-09-16 | Discharge: 2012-09-16 | Disposition: A | Discharge status: Home / Self Care | Payer: PRIVATE HEALTH INSURANCE | Source: Ambulatory Visit | Attending: Cardiology | Admitting: Cardiology

## 2012-09-16 VITALS — BP 144/82 | HR 96 | Ht 62.0 in | Wt 148.1 lb

## 2012-09-16 DIAGNOSIS — R0989 Other specified symptoms and signs involving the circulatory and respiratory systems: Secondary | ICD-10-CM

## 2012-09-16 DIAGNOSIS — R0609 Other forms of dyspnea: Secondary | ICD-10-CM

## 2012-09-16 DIAGNOSIS — R011 Cardiac murmur, unspecified: Secondary | ICD-10-CM

## 2012-09-16 DIAGNOSIS — R0600 Dyspnea, unspecified: Secondary | ICD-10-CM

## 2012-09-16 DIAGNOSIS — R0602 Shortness of breath: Secondary | ICD-10-CM

## 2012-09-16 DIAGNOSIS — E785 Hyperlipidemia, unspecified: Secondary | ICD-10-CM

## 2012-09-16 NOTE — Assessment & Plan Note (Signed)
Etiology unclear. Not volume overloaded on examination. Check chest x-ray. Given soft murmur will arrange echocardiogram to assess LV function and to exclude valvular disease. We'll also arrange exercise treadmill.

## 2012-09-16 NOTE — Assessment & Plan Note (Signed)
Probable ejection murmur. Echocardiogram to further assess.

## 2012-09-16 NOTE — Assessment & Plan Note (Signed)
Management per primary care. 

## 2012-09-16 NOTE — Patient Instructions (Addendum)
Your physician recommends that you schedule a follow-up appointment in: AS NEEDED PENDING TEST RESULTS  Your physician has requested that you have an exercise tolerance test. For further information please visit [email or web address]. Please also follow instruction sheet, as given.   Your physician has requested that you have an echocardiogram. Echocardiography is a painless test that uses sound waves to create images of your heart. It provides your doctor with information about the size and shape of your heart and how well your heart's chambers and valves are working. This procedure takes approximately one hour. There are no restrictions for this procedure.   A chest x-ray takes a picture of the organs and structures inside the chest, including the heart, lungs, and blood vessels. This test can show several things, including, whether the heart is enlarges; whether fluid is building up in the lungs; and whether pacemaker / defibrillator leads are still in place. AT [facility] OFF[facility]

## 2012-09-16 NOTE — Progress Notes (Signed)
HPI: 51 year-old female with no prior cardiac history for evaluation of dyspnea and murmur. Patient has noticed dyspnea on exertion for approximately one year. This has progressed recently. There is no orthopnea, PND, pedal edema, recent syncope. She does not have exertional chest pain although has had brief pain in the past attributed to gas. She was recently also noted to have a murmur and cardiology was asked to evaluate.  Current Outpatient Prescriptions  Medication Sig Dispense Refill  . Arginine 500 MG CAPS Take 500 mg by mouth daily.      Marland Kitchen aspirin 81 MG tablet Take 81 mg by mouth daily.      Marland Kitchen b complex vitamins tablet Take 1 tablet by mouth daily.      . Biotin (BIOTIN 5000) 5 MG CAPS Take 5,000 mg by mouth daily.      . fish oil-omega-3 fatty acids 1000 MG capsule Take 1,200 mg by mouth daily.      Marland Kitchen gabapentin (NEURONTIN) 600 MG tablet Take 600 mg by mouth daily.       . Ginkgo Biloba 40 MG TABS Take 1 tablet by mouth daily.      Marland Kitchen glucosamine-chondroitin 500-400 MG tablet Take 1 tablet by mouth daily.      Marland Kitchen lamoTRIgine (LAMICTAL) 100 MG tablet Take 100 mg by mouth daily.      Marland Kitchen LECITHIN PO Take 1 tablet by mouth daily.      . methylphenidate (RITALIN) 20 MG tablet Take 20 mg by mouth 3 (three) times daily as needed.       . milk thistle 175 MG tablet Take 175 mg by mouth daily.      . Multiple Vitamin (MULTIVITAMIN) tablet Take 1 tablet by mouth daily.      . naproxen (NAPROSYN) 500 MG tablet Take 500 mg by mouth as needed.      . polycarbophil (FIBERCON) 625 MG tablet Take 625 mg by mouth daily.      Marland Kitchen VITAMIN D, CHOLECALCIFEROL, PO Take 1.25 mg by mouth daily.      Marland Kitchen sulfamethoxazole-trimethoprim (BACTRIM DS) 800-160 MG per tablet         No Known Allergies  Past Medical History  Diagnosis Date  . HTN (hypertension)   . Hypoglycemia   . Goiter     Past Surgical History  Procedure Date  . Gastric bypass   . Abdominal hysterectomy   . Breast enhancement surgery   .  Total shoulder replacement   . Stomach tuck     History   Social History  . Marital Status: Married    Spouse Name: N/A    Number of Children: 2  . Years of Education: N/A   Occupational History  .     Social History Main Topics  . Smoking status: Never Smoker   . Smokeless tobacco: Never Used  . Alcohol Use: 0.5 oz/week    1 drink(s) per week     Occasional  . Drug Use: No  . Sexually Active: Not on file   Other Topics Concern  . Not on file   Social History Narrative  . No narrative on file    Family History  Problem Relation Age of Onset  . Coronary artery disease Father     MI at age 17    ROS: no fevers or chills, productive cough, hemoptysis, dysphasia, odynophagia, melena, hematochezia, dysuria, hematuria, rash, seizure activity, orthopnea, PND, pedal edema, claudication. Remaining systems are negative.  Physical Exam:  Blood pressure 144/82, pulse 96, height 5' 2"  (1.575 m), weight 148 lb 1.9 oz (67.187 kg).  General:  Well developed/well nourished in NAD Skin warm/dry Patient not depressed No peripheral clubbing Back-normal HEENT-normal/normal eyelids Neck supple/normal carotid upstroke bilaterally; no bruits; no JVD; no thyromegaly chest - CTA/ normal expansion CV - RRR/normal S1 and S2; no rubs or gallops;  PMI nondisplaced, 2/6 systolic murmur left sternal border. S2 is not diminished. Abdomen -NT/ND, no HSM, no mass, + bowel sounds, no bruit 2+ femoral pulses, no bruits Ext-no edema, chords, 2+ DP Neuro-grossly nonfocal  ECG sinus rhythm at a rate of 94. Left atrial enlargement. No significant ST changes.

## 2012-09-22 ENCOUNTER — Ambulatory Visit (HOSPITAL_COMMUNITY): Payer: PRIVATE HEALTH INSURANCE | Attending: Cardiology | Admitting: Radiology

## 2012-09-22 DIAGNOSIS — R0609 Other forms of dyspnea: Secondary | ICD-10-CM | POA: Insufficient documentation

## 2012-09-22 DIAGNOSIS — R011 Cardiac murmur, unspecified: Secondary | ICD-10-CM | POA: Insufficient documentation

## 2012-09-22 DIAGNOSIS — Z8249 Family history of ischemic heart disease and other diseases of the circulatory system: Secondary | ICD-10-CM | POA: Insufficient documentation

## 2012-09-22 DIAGNOSIS — R0989 Other specified symptoms and signs involving the circulatory and respiratory systems: Secondary | ICD-10-CM | POA: Insufficient documentation

## 2012-09-22 DIAGNOSIS — R0600 Dyspnea, unspecified: Secondary | ICD-10-CM

## 2012-09-22 DIAGNOSIS — I10 Essential (primary) hypertension: Secondary | ICD-10-CM | POA: Insufficient documentation

## 2012-09-22 NOTE — Progress Notes (Signed)
Echocardiogram performed.  

## 2012-09-30 ENCOUNTER — Encounter: Payer: Self-pay | Admitting: Nurse Practitioner

## 2012-09-30 ENCOUNTER — Ambulatory Visit (INDEPENDENT_AMBULATORY_CARE_PROVIDER_SITE_OTHER): Payer: PRIVATE HEALTH INSURANCE | Admitting: Nurse Practitioner

## 2012-09-30 VITALS — BP 140/84 | HR 101 | Resp 20 | Ht 62.0 in | Wt 148.0 lb

## 2012-09-30 DIAGNOSIS — R0600 Dyspnea, unspecified: Secondary | ICD-10-CM

## 2012-09-30 DIAGNOSIS — R011 Cardiac murmur, unspecified: Secondary | ICD-10-CM

## 2012-09-30 NOTE — Procedures (Signed)
Exercise Treadmill Test  Pre-Exercise Testing Evaluation Rhythm: sinus tachycardia  Rate: 101   PR:  .14 QRS:  .08  QT:  .37 QTc: .48     Test  Exercise Tolerance Test Ordering MD: Kirk Ruths, MD  Interpreting MD: Truitt Merle , NP  Unique Test No: 1  Treadmill:  1  Indication for ETT: Murmur  Contraindication to ETT: No   Stress Modality: exercise - treadmill  Cardiac Imaging Performed: non   Protocol: standard Bruce - maximal  Max BP:  165/109  Max MPHR (bpm):  169 85% MPR (bpm):  144  MPHR obtained (bpm):  150 % MPHR obtained:  87%  Reached 85% MPHR (min:sec):  2:57 Total Exercise Time (min-sec):  5:00  Workload in METS:  6.9 Borg Scale: 15  Reason ETT Terminated:  patient's desire to stop    ST Segment Analysis At Rest: Nonspecific ST changes noted on resting EKG.  With Exercise: non-specific ST changes  Other Information Arrhythmia:  No Angina during ETT:  absent (0) Quality of ETT:  diagnostic  ETT Interpretation:  Overall, felt to be negative for ischemia.   Comments: The patient presents today for routine GXT testing. She is seen for Dr. Stanford Breed. She has had dyspnea. Echo was basically normal but did have diastolic dysfunction. EF was normal. She admits that she is "out of shape and overweight".   Today, she exercised on the standard Bruce protocol. She exercised for 5 minutes. She has poor exercise tolerance. Her target heart rate was achieved at 3 minutes. The test was stopped due to fatigue and achievement of her target heart rate. Adequate blood pressure response. EKG with resting ST changes. She has ST upsloping with exercise and overall felt to be negative for ischemia. No arrhythmia noted.  Recommendations: She is felt to be deconditioned. A walking program is strongly recommended along with weight loss. We have discussed this in depth today. I have asked her to start walking 10 minutes twice a day with a goal of getting to 45 to 60 minutes per day.   We  will tentatively see her back prn or if her symptoms persist.  Patient is agreeable to this plan and will call if any problems develop in the interim.

## 2012-09-30 NOTE — Patient Instructions (Signed)
Exercise daily with a goal of working towards 45 to 60 minutes per day  We will see you back prn  Call the [place] office at [phone] if you have any questions, problems or concerns.

## 2013-04-03 ENCOUNTER — Other Ambulatory Visit: Payer: Self-pay

## 2013-04-03 DIAGNOSIS — Z1231 Encounter for screening mammogram for malignant neoplasm of breast: Secondary | ICD-10-CM

## 2013-05-10 ENCOUNTER — Ambulatory Visit
Admission: RE | Admit: 2013-05-10 | Discharge: 2013-05-10 | Disposition: A | Discharge status: Home / Self Care | Payer: BC Managed Care – PPO | Source: Ambulatory Visit

## 2013-05-10 DIAGNOSIS — Z1231 Encounter for screening mammogram for malignant neoplasm of breast: Secondary | ICD-10-CM

## 2013-07-19 ENCOUNTER — Encounter (HOSPITAL_COMMUNITY): Payer: Self-pay | Admitting: Emergency Medicine

## 2013-07-19 ENCOUNTER — Emergency Department (HOSPITAL_COMMUNITY): Payer: BC Managed Care – PPO

## 2013-07-19 ENCOUNTER — Observation Stay (HOSPITAL_COMMUNITY)
Admission: EM | Admit: 2013-07-19 | Discharge: 2013-07-21 | Disposition: A | Discharge status: Home / Self Care | Payer: BC Managed Care – PPO | Attending: Surgery | Admitting: Surgery

## 2013-07-19 DIAGNOSIS — G8929 Other chronic pain: Secondary | ICD-10-CM | POA: Diagnosis present

## 2013-07-19 DIAGNOSIS — F988 Other specified behavioral and emotional disorders with onset usually occurring in childhood and adolescence: Secondary | ICD-10-CM | POA: Diagnosis present

## 2013-07-19 DIAGNOSIS — Z96619 Presence of unspecified artificial shoulder joint: Secondary | ICD-10-CM | POA: Insufficient documentation

## 2013-07-19 DIAGNOSIS — E162 Hypoglycemia, unspecified: Secondary | ICD-10-CM | POA: Insufficient documentation

## 2013-07-19 DIAGNOSIS — K8010 Calculus of gallbladder with chronic cholecystitis without obstruction: Principal | ICD-10-CM | POA: Insufficient documentation

## 2013-07-19 DIAGNOSIS — M25519 Pain in unspecified shoulder: Secondary | ICD-10-CM | POA: Insufficient documentation

## 2013-07-19 DIAGNOSIS — Z9884 Bariatric surgery status: Secondary | ICD-10-CM | POA: Insufficient documentation

## 2013-07-19 DIAGNOSIS — Z79899 Other long term (current) drug therapy: Secondary | ICD-10-CM | POA: Insufficient documentation

## 2013-07-19 DIAGNOSIS — G40309 Generalized idiopathic epilepsy and epileptic syndromes, not intractable, without status epilepticus: Secondary | ICD-10-CM | POA: Diagnosis present

## 2013-07-19 DIAGNOSIS — Z7982 Long term (current) use of aspirin: Secondary | ICD-10-CM | POA: Insufficient documentation

## 2013-07-19 DIAGNOSIS — K8000 Calculus of gallbladder with acute cholecystitis without obstruction: Secondary | ICD-10-CM | POA: Diagnosis present

## 2013-07-19 DIAGNOSIS — G40909 Epilepsy, unspecified, not intractable, without status epilepticus: Secondary | ICD-10-CM | POA: Insufficient documentation

## 2013-07-19 DIAGNOSIS — K219 Gastro-esophageal reflux disease without esophagitis: Secondary | ICD-10-CM | POA: Diagnosis present

## 2013-07-19 HISTORY — DX: Gastro-esophageal reflux disease without esophagitis: K21.9

## 2013-07-19 HISTORY — DX: Hypoglycemia, unspecified: E16.2

## 2013-07-19 HISTORY — DX: Generalized idiopathic epilepsy and epileptic syndromes, not intractable, without status epilepticus: G40.309

## 2013-07-19 HISTORY — DX: Other chronic pain: G89.29

## 2013-07-19 HISTORY — DX: Pain in left shoulder: M25.512

## 2013-07-19 HISTORY — DX: Other specified behavioral and emotional disorders with onset usually occurring in childhood and adolescence: F98.8

## 2013-07-19 HISTORY — DX: Calculus of gallbladder with acute cholecystitis without obstruction: K80.00

## 2013-07-19 LAB — CBC WITH DIFFERENTIAL/PLATELET
HCT: 39 % (ref 36.0–46.0)
Hemoglobin: 13.2 g/dL (ref 12.0–15.0)
Lymphocytes Relative: 10 % — ABNORMAL LOW (ref 12–46)
Lymphs Abs: 0.9 10*3/uL (ref 0.7–4.0)
MCHC: 33.8 g/dL (ref 30.0–36.0)
Monocytes Absolute: 1.9 10*3/uL — ABNORMAL HIGH (ref 0.1–1.0)
Monocytes Relative: 22 % — ABNORMAL HIGH (ref 3–12)
Neutro Abs: 6.2 10*3/uL (ref 1.7–7.7)
RBC: 4.37 MIL/uL (ref 3.87–5.11)
WBC: 9 10*3/uL (ref 4.0–10.5)

## 2013-07-19 LAB — COMPREHENSIVE METABOLIC PANEL
ALT: 31 U/L (ref 0–35)
Calcium: 8.8 mg/dL (ref 8.4–10.5)
GFR calc Af Amer: >90 mL/min (ref >90)
Glucose, Bld: 91 mg/dL (ref 70–99)
Sodium: 134 mEq/L — ABNORMAL LOW (ref 135–145)
Total Protein: 6.8 g/dL (ref 6.0–8.3)

## 2013-07-19 LAB — SURGICAL PCR SCREEN
MRSA, PCR: NEGATIVE
Staphylococcus aureus: NEGATIVE

## 2013-07-19 LAB — GLUCOSE, CAPILLARY: Glucose-Capillary: 84 mg/dL (ref 70–99)

## 2013-07-19 MED ORDER — ONDANSETRON HCL 4 MG/2ML IJ SOLN: 4.0000 mg | Freq: Four times a day (QID) | INTRAMUSCULAR | Status: DC | PRN

## 2013-07-19 MED ORDER — KETOROLAC TROMETHAMINE 30 MG/ML IJ SOLN
INTRAMUSCULAR | Status: AC
  Filled 2013-07-19: qty 1

## 2013-07-19 MED ORDER — SODIUM CHLORIDE 0.9 % IV SOLN
3.0000 g | Freq: Four times a day (QID) | INTRAVENOUS | Status: DC
  Administered 2013-07-19 – 2013-07-20 (×3): 3 g via INTRAVENOUS
  Filled 2013-07-19 (×4): qty 3

## 2013-07-19 MED ORDER — HYDROMORPHONE HCL PF 1 MG/ML IJ SOLN
INTRAMUSCULAR | Status: AC
  Administered 2013-07-19: 1 mg
  Filled 2013-07-19: qty 1

## 2013-07-19 MED ORDER — OXYCODONE-ACETAMINOPHEN 5-325 MG PO TABS
1.0000 | ORAL_TABLET | ORAL | Status: DC | PRN
  Administered 2013-07-21 (×2): 1 via ORAL
  Filled 2013-07-19 (×2): qty 2
  Filled 2013-07-19: qty 1
  Filled 2013-07-19: qty 2

## 2013-07-19 MED ORDER — ACETAMINOPHEN 325 MG PO TABS
650.0000 mg | ORAL_TABLET | Freq: Four times a day (QID) | ORAL | Status: DC | PRN
  Administered 2013-07-19: 650 mg via ORAL
  Filled 2013-07-19: qty 2

## 2013-07-19 MED ORDER — HYDROMORPHONE HCL PF 1 MG/ML IJ SOLN
0.5000 mg | INTRAMUSCULAR | Status: DC | PRN
  Administered 2013-07-19 – 2013-07-20 (×4): 1 mg via INTRAVENOUS
  Filled 2013-07-19 (×4): qty 1

## 2013-07-19 MED ORDER — AMPICILLIN-SULBACTAM SODIUM 3 (2-1) G IJ SOLR
3.0000 g | Freq: Once | INTRAMUSCULAR | Status: AC
  Administered 2013-07-19: 3 g via INTRAVENOUS
  Filled 2013-07-19: qty 3

## 2013-07-19 MED ORDER — DIPHENHYDRAMINE HCL 50 MG/ML IJ SOLN: 12.5000 mg | Freq: Four times a day (QID) | INTRAMUSCULAR | Status: DC | PRN

## 2013-07-19 MED ORDER — SODIUM CHLORIDE 0.9 % IV BOLUS (SEPSIS)
1000.0000 mL | Freq: Once | INTRAVENOUS | Status: AC
  Administered 2013-07-19: 1000 mL via INTRAVENOUS

## 2013-07-19 MED ORDER — HYDROMORPHONE HCL PF 1 MG/ML IJ SOLN
1.0000 mg | Freq: Once | INTRAMUSCULAR | Status: AC
  Administered 2013-07-19: 1 mg via INTRAVENOUS
  Filled 2013-07-19: qty 1

## 2013-07-19 MED ORDER — DIPHENHYDRAMINE HCL 12.5 MG/5ML PO ELIX: 12.5000 mg | ORAL_SOLUTION | Freq: Four times a day (QID) | ORAL | Status: DC | PRN

## 2013-07-19 MED ORDER — KETOROLAC TROMETHAMINE 30 MG/ML IJ SOLN
30.0000 mg | Freq: Once | INTRAMUSCULAR | Status: AC
  Administered 2013-07-19: 30 mg via INTRAVENOUS

## 2013-07-19 MED ORDER — POTASSIUM CHLORIDE IN NACL 20-0.9 MEQ/L-% IV SOLN
INTRAVENOUS | Status: DC
  Administered 2013-07-19 – 2013-07-20 (×2): via INTRAVENOUS
  Filled 2013-07-19 (×4): qty 1000

## 2013-07-19 NOTE — ED Notes (Signed)
Patient states abdominal pain now 2/10.  Ultrasound in progress at bedside.

## 2013-07-19 NOTE — ED Notes (Signed)
Report called to [facility].  Patient to be transported to room 1534 via tech.

## 2013-07-19 NOTE — ED Provider Notes (Signed)
[name]: [id number]     Arrival date & time 07/19/13  1038 History     First MD Initiated Contact with Patient 07/19/13 1058     Chief Complaint  Patient presents with  . Abdominal Pain   (Consider location/radiation/quality/duration/timing/severity/associated sxs/prior Treatment) Patient is a 52 y.o. female presenting with abdominal pain. The history is provided by the patient.  Abdominal Pain Pain location:  RUQ Pain quality: stabbing   Pain radiates to:  Does not radiate Pain severity:  Severe Onset quality:  Gradual Duration:  3 days Timing:  Constant Progression:  Worsening Relieved by:  Nothing Associated symptoms: fever (tmax 101 x 3 days)   Associated symptoms: no chest pain, no cough, no diarrhea, no dysuria, no nausea, no shortness of breath and no vomiting     Past Medical History  Diagnosis Date  . HTN (hypertension)   . Hypoglycemia   . Goiter   . Murmur   . Dyspnea    Past Surgical History  Procedure Laterality Date  . Gastric bypass    . Abdominal hysterectomy    . Breast enhancement surgery    . Total shoulder replacement    . Stomach tuck     Family History  Problem Relation Age of Onset  . Coronary artery disease Father     MI at age 37   History  Substance Use Topics  . Smoking status: Never Smoker   . Smokeless tobacco: Never Used  . Alcohol Use: 0.5 oz/week    1 drink(s) per week     Comment: Occasional   OB History   Grav Para Term Preterm Abortions TAB SAB Ect Mult Living                 Review of Systems  Constitutional: Positive for fever (tmax 101 x 3 days).  Respiratory: Negative for cough and shortness of breath.   Cardiovascular: Negative for chest pain.  Gastrointestinal: Positive for abdominal pain. Negative for nausea, vomiting and diarrhea.  Genitourinary: Negative for dysuria.  Musculoskeletal: Positive for back pain.  All other systems reviewed and are negative.    Allergies  Review of patient's allergies indicates  no known allergies.  Home Medications   Current Outpatient Rx  Name  Route  Sig  Dispense  Refill  . amphetamine-dextroamphetamine (ADDERALL XR) 20 MG 24 hr capsule   Oral   Take 20 mg by mouth every morning.         . Arginine 500 MG CAPS   Oral   Take 500 mg by mouth daily.         Marland Kitchen aspirin 81 MG tablet   Oral   Take 81 mg by mouth daily.         Marland Kitchen b complex vitamins tablet   Oral   Take 1 tablet by mouth daily.         . Biotin (BIOTIN 5000) 5 MG CAPS   Oral   Take 5,000 mg by mouth daily.         . fish oil-omega-3 fatty acids 1000 MG capsule   Oral   Take 1,200 mg by mouth daily.         Marland Kitchen gabapentin (NEURONTIN) 600 MG tablet   Oral   Take 600 mg by mouth daily.          . Ginkgo Biloba 40 MG TABS   Oral   Take 1 tablet by mouth daily.         Marland Kitchen  glucosamine-chondroitin 500-400 MG tablet   Oral   Take 1 tablet by mouth daily.         Marland Kitchen lamoTRIgine (LAMICTAL) 100 MG tablet   Oral   Take 100 mg by mouth daily.         . Lecithin 400 MG CAPS   Oral   Take 400 mg by mouth daily.         . LevOCARNitine (L-CARNITINE) 500 MG TABS   Oral   Take 500 mg by mouth daily.         . milk thistle 175 MG tablet   Oral   Take 175 mg by mouth daily.         . Multiple Vitamin (MULTIVITAMIN) tablet   Oral   Take 1 tablet by mouth daily.         . naproxen (NAPROSYN) 500 MG tablet   Oral   Take 500 mg by mouth as needed.         . polycarbophil (FIBERCON) 625 MG tablet   Oral   Take 625 mg by mouth daily.         Marland Kitchen VITAMIN D, CHOLECALCIFEROL, PO   Oral   Take 1.25 mg by mouth daily.          BP 157/79  Pulse 104  Temp(Src) 100.1 F (37.8 C) (Oral)  Resp 16  SpO2 96% Physical Exam  Nursing note and vitals reviewed. Constitutional: She is oriented to person, place, and time. She appears well-developed and well-nourished.  HENT:  Head: Normocephalic and atraumatic.  Right Ear: External ear normal.  Left Ear:  External ear normal.  Nose: Nose normal.  Eyes: Right eye exhibits no discharge. Left eye exhibits no discharge.  Cardiovascular: Regular rhythm and normal heart sounds.  Tachycardia present.   Pulmonary/Chest: Effort normal and breath sounds normal.  Abdominal: Soft. There is tenderness in the right upper quadrant and epigastric area.  Neurological: She is alert and oriented to person, place, and time.  Skin: Skin is warm and dry.    ED Course   Procedures (including critical care time)  Labs Reviewed  COMPREHENSIVE METABOLIC PANEL - Abnormal; Notable for the following:    Sodium 134 (*)    Creatinine, Ser 0.45 (*)    Albumin 3.2 (*)    AST 51 (*)    All other components within normal limits  CBC WITH DIFFERENTIAL - Abnormal; Notable for the following:    Lymphocytes Relative 10 (*)    Monocytes Relative 22 (*)    Monocytes Absolute 1.9 (*)    All other components within normal limits  SURGICAL PCR SCREEN  LIPASE, BLOOD  LACTIC ACID, PLASMA  GLUCOSE, CAPILLARY  URINALYSIS, ROUTINE W REFLEX MICROSCOPIC  CBC  COMPREHENSIVE METABOLIC PANEL  LIPASE, BLOOD   US Abdomen Complete  07/19/2013   *RADIOLOGY REPORT*  Clinical Data:  Right upper quadrant abdominal pain  COMPLETE ABDOMINAL ULTRASOUND  Comparison:  Ultrasound of the abdomen of 04/29/2011  Findings:  Gallbladder:  The gallbladder is visualized and multiple small gallstones layer in the gallbladder with acoustical shadowing, none measuring larger than 4 mm in diameter.  No pain is present over the gallbladder with compression.  Common bile duct:  The common bile duct is normal measuring 5.1 mm in diameter.  Liver:  The liver is minimally echogenic and mild fatty infiltration cannot be excluded.  No focal abnormality is seen.  IVC:  Appears normal.  Pancreas:  No focal abnormality seen.  Spleen:  The spleen is normal measuring 5.5 cm sagittally.  Right Kidney:  No hydronephrosis is seen.  The right kidney measures 11.6 cm  sagittally.  Left Kidney:  No hydronephrosis is noted.  The left kidney measures 10.9 years.  Abdominal aorta:  The abdominal aorta is normal in caliber.  IMPRESSION:  1.  Multiple small gallstones.  No pain over the gallbladder currently. 2.  Questionable mild fatty infiltration of the liver.  No ductal dilatation.   Original Report Authenticated By: Ivar Drape, M.D.   1. Cholecystitis, acute with cholelithiasis     MDM  With her having fever, RUQ pain and gallstones, there is still high concern for cholecystitis. D/w surgeon on call, will give unasyn, pain control and admit to surgery.  Ephraim Hamburger, MD 07/19/13 [phone]

## 2013-07-19 NOTE — H&P (Signed)
Emily Graham is an 52 y.o. female.   PCP: Alesia Richards, MD  Chief Complaint:  Abdominal pain and fever. HPI: Pt says she was Ok till 07/16/13 when she started feeling bad and had a fever to 101.6.  Sounds like it got better, but she still felt bad.  Yesterday she started having pain and fever again.  Up to 101.6 again.  She has not had nausea or vomiting with this.  Her last PO intake was about 8:30-9:00 this AM.  She has ongoing pain RUQ going to her back, fever, with tachycardia.  Her labs are all normal, but her ABD US shows layering stones, none greater than 4 mm.  CBD was 5.1 CM. We are ask to see.  Past Medical History  Diagnosis Date  . HTN (hypertension)  Pt says this is not an issue and not treated for it.   . Hypoglycemia   . Goiter   . Murmur  Cardiology work up last year was essentially normal.   . Dyspnea   . Seizure disorder, primary generalized 07/19/2013    May be related to hypoglycemia  . ADD (attention deficit disorder) 07/19/2013  . GERD (gastroesophageal reflux disease) 07/19/2013  . Chronic left shoulder pain 07/19/2013    Past Surgical History  Procedure Laterality Date  . Gastric bypass  NOV 2000  . Abdominal hysterectomy    . Breast enhancement surgery    . Total shoulder replacement    . Stomach tuck      Family History  Problem Relation Age of Onset  . Coronary artery disease Father     MI at age 28  Mother living and in good health Brothers:  Both in good health Multiple sisters, one with hx of gastric bypass and perforated colon.  Social History:  reports that she has never smoked. She has never used smokeless tobacco. She reports that she drinks about 0.5 ounces of alcohol per week. She reports that she does not use illicit drugs. Tobacco: None  Drugs: none ETOH:  Glass or 2 of wine daily, no more than 2 bottles per day.  Allergies: No Known Allergies Prior to Admission medications   Medication Sig Start Date End Date Taking? Authorizing  Provider  amphetamine-dextroamphetamine (ADDERALL XR) 20 MG 24 hr capsule Take 20 mg by mouth every morning.   Yes Historical Provider, MD  Arginine 500 MG CAPS Take 500 mg by mouth daily.   Yes Historical Provider, MD  aspirin 81 MG tablet Take 81 mg by mouth daily.   Yes Historical Provider, MD  b complex vitamins tablet Take 1 tablet by mouth daily.   Yes Historical Provider, MD  Biotin (BIOTIN 5000) 5 MG CAPS Take 5,000 mg by mouth daily.   Yes Historical Provider, MD  fish oil-omega-3 fatty acids 1000 MG capsule Take 1,200 mg by mouth daily.   Yes Historical Provider, MD  gabapentin (NEURONTIN) 600 MG tablet Take 600 mg by mouth daily.  09/15/12  Yes Historical Provider, MD  Ginkgo Biloba 40 MG TABS Take 1 tablet by mouth daily.   Yes Historical Provider, MD  glucosamine-chondroitin 500-400 MG tablet Take 1 tablet by mouth daily.   Yes Historical Provider, MD  lamoTRIgine (LAMICTAL) 100 MG tablet Take 100 mg by mouth daily.   Yes Historical Provider, MD  Lecithin 400 MG CAPS Take 400 mg by mouth daily.   Yes Historical Provider, MD  LevOCARNitine (L-CARNITINE) 500 MG TABS Take 500 mg by mouth daily.   Yes Historical Provider,  MD  milk thistle 175 MG tablet Take 175 mg by mouth daily.   Yes Historical Provider, MD  Multiple Vitamin (MULTIVITAMIN) tablet Take 1 tablet by mouth daily.   Yes Historical Provider, MD  naproxen (NAPROSYN) 500 MG tablet Take 500 mg by mouth as needed.   Yes Historical Provider, MD  polycarbophil (FIBERCON) 625 MG tablet Take 625 mg by mouth daily.   Yes Historical Provider, MD  VITAMIN D, CHOLECALCIFEROL, PO Take 1.25 mg by mouth daily.   Yes Historical Provider, MD     (Not in a hospital admission)  Results for orders placed during the hospital encounter of 07/19/13 (from the past 48 hour(s))  COMPREHENSIVE METABOLIC PANEL     Status: Abnormal   Collection Time    07/19/13 11:45 AM      Result Value Range   Sodium 134 (*) 135 - 145 mEq/L   Potassium 3.5   3.5 - 5.1 mEq/L   Chloride 96  96 - 112 mEq/L   CO2 29  19 - 32 mEq/L   Glucose, Bld 91  70 - 99 mg/dL   BUN 6  6 - 23 mg/dL   Creatinine, Ser 0.45 (*) 0.50 - 1.10 mg/dL   Calcium 8.8  8.4 - 10.5 mg/dL   Total Protein 6.8  6.0 - 8.3 g/dL   Albumin 3.2 (*) 3.5 - 5.2 g/dL   AST 51 (*) 0 - 37 U/L   ALT 31  0 - 35 U/L   Alkaline Phosphatase 85  39 - 117 U/L   Total Bilirubin 0.5  0.3 - 1.2 mg/dL   GFR calc non Af Amer >90  >90 mL/min   GFR calc Af Amer >90  >90 mL/min   Comment:            The eGFR has been calculated     using the CKD EPI equation.     This calculation has not been     validated in all clinical     situations.     eGFR's persistently     <90 mL/min signify     possible Chronic Kidney Disease.  CBC WITH DIFFERENTIAL     Status: Abnormal   Collection Time    07/19/13 11:45 AM      Result Value Range   WBC 9.0  4.0 - 10.5 K/uL   RBC 4.37  3.87 - 5.11 MIL/uL   Hemoglobin 13.2  12.0 - 15.0 g/dL   HCT 39.0  36.0 - 46.0 %   MCV 89.2  78.0 - 100.0 fL   MCH 30.2  26.0 - 34.0 pg   MCHC 33.8  30.0 - 36.0 g/dL   RDW 13.8  11.5 - 15.5 %   Platelets 162  150 - 400 K/uL   Neutrophils Relative % 69  43 - 77 %   Neutro Abs 6.2  1.7 - 7.7 K/uL   Lymphocytes Relative 10 (*) 12 - 46 %   Lymphs Abs 0.9  0.7 - 4.0 K/uL   Monocytes Relative 22 (*) 3 - 12 %   Monocytes Absolute 1.9 (*) 0.1 - 1.0 K/uL   Eosinophils Relative 0  0 - 5 %   Eosinophils Absolute 0.0  0.0 - 0.7 K/uL   Basophils Relative 0  0 - 1 %   Basophils Absolute 0.0  0.0 - 0.1 K/uL  LIPASE, BLOOD     Status: None   Collection Time    07/19/13 11:45 AM  Result Value Range   Lipase 21  11 - 59 U/L  LACTIC ACID, PLASMA     Status: None   Collection Time    07/19/13 11:45 AM      Result Value Range   Lactic Acid, Venous 1.0  0.5 - 2.2 mmol/L   US Abdomen Complete  07/19/2013   *RADIOLOGY REPORT*  Clinical Data:  Right upper quadrant abdominal pain  COMPLETE ABDOMINAL ULTRASOUND  Comparison:  Ultrasound  of the abdomen of 04/29/2011  Findings:  Gallbladder:  The gallbladder is visualized and multiple small gallstones layer in the gallbladder with acoustical shadowing, none measuring larger than 4 mm in diameter.  No pain is present over the gallbladder with compression.  Common bile duct:  The common bile duct is normal measuring 5.1 mm in diameter.  Liver:  The liver is minimally echogenic and mild fatty infiltration cannot be excluded.  No focal abnormality is seen.  IVC:  Appears normal.  Pancreas:  No focal abnormality seen.  Spleen:  The spleen is normal measuring 5.5 cm sagittally.  Right Kidney:  No hydronephrosis is seen.  The right kidney measures 11.6 cm sagittally.  Left Kidney:  No hydronephrosis is noted.  The left kidney measures 10.9 years.  Abdominal aorta:  The abdominal aorta is normal in caliber.  IMPRESSION:  1.  Multiple small gallstones.  No pain over the gallbladder currently. 2.  Questionable mild fatty infiltration of the liver.  No ductal dilatation.   Original Report Authenticated By: Ivar Drape, M.D.    Review of Systems  Constitutional: Positive for fever (up to 101.6 at home Sunday and today.) and chills. Negative for weight loss, malaise/fatigue and diaphoresis.  HENT: Negative.   Eyes: Negative.   Respiratory: Positive for shortness of breath.        SOB sounds like ongoing deconditioning as noted by Cardiology last year.  Cardiovascular: Negative.   Gastrointestinal: Positive for heartburn (Stable on home meds.) and abdominal pain (Pain RUQ going to back). Negative for nausea, vomiting, diarrhea, constipation, blood in stool and melena.       She does have some blood from her hemorrhoids with BM.  Skin: Negative.   Neurological: Positive for seizures (None since last admit for this in 2012). Negative for loss of consciousness and weakness.       Frequent headaches since [employer] bite this year.  Endo/Heme/Allergies: Negative.   Psychiatric/Behavioral:  Negative.     Blood pressure 157/79, pulse 104, temperature 100.1 F (37.8 C), temperature source Oral, resp. rate 16, SpO2 96.00%. Physical Exam  Constitutional: She is oriented to person, place, and time. She appears well-developed and well-nourished. She appears distressed.  She is tachycardic with HR 104, fever reading 100.8 but feels warmer than that on exam.  HENT:  Head: Normocephalic and atraumatic.  Nose: Nose normal.  Eyes: Conjunctivae and EOM are normal. Pupils are equal, round, and reactive to light. Right eye exhibits no discharge. Left eye exhibits no discharge. No scleral icterus.  Neck: Normal range of motion. Neck supple. No JVD present. No tracheal deviation present. No thyromegaly present.  Cardiovascular: Regular rhythm and intact distal pulses.   Murmur: soft mumur ? mitral? Respiratory: Effort normal and breath sounds normal. No respiratory distress. She has no wheezes. She has no rales. She exhibits no tenderness.  GI: Soft. Bowel sounds are normal. She exhibits no distension and no mass. There is tenderness (She is tender and having pain RUQ going to back.). There is  no rebound and no guarding.  Musculoskeletal: She exhibits no edema.  Lymphadenopathy:    She has no cervical adenopathy.  Neurological: She is alert and oriented to person, place, and time. A cranial nerve deficit is present.  Skin: Skin is warm and dry. No rash noted. She is not diaphoretic. No erythema. No pallor.  Psychiatric: She has a normal mood and affect. Her behavior is normal. Judgment and thought content normal.     Assessment/Plan 1.  Cholecystitis with cholelithiasis 2.  Hypoglycemia 3.  Hx of seizures, possibly related to hypoglycemia 4.  Hx of Goiter 5.  GERD 6.  Chronic left shoulder pain 7.  ADD on medication  8.  History of gastric bypass - 2000  Sustained weight loss of about 80 pounds. 9.  History of  abdominoplasty - 2004  Plan:  Start fluids, some Toradol for her fever,  and antibiotics, will discuss surgery with Dr. Lucia Gaskins.  He has seen and will set her up for surgery in the AM.  Clears for now, I will recheck labs in AM.  JENNINGS,WILLARD 07/19/2013, 2:29 PM  Agree with above. Husband at bedside.  Husband works Teaching laboratory technician. I discussed with the patient the indications and risks of gall bladder surgery.  The primary risks of gall bladder surgery include, but are not limited to, bleeding, infection, common bile duct injury, and open surgery.  There is also the risk that the patient may have continued symptoms after surgery.  However, the likelihood of improvement in symptoms and return to the patient's normal status is good. We discussed the typical post-operative recovery course. I tried to answer the patient's questions. I gave the patient literature about gall bladder surgery. I told her that the prior bypass can  Alphonsa Overall, MD, [facility] Pager: [phone] Office phone:  [phone]

## 2013-07-19 NOTE — ED Notes (Signed)
Pt sent from pcp. Reports RUQ pain, fever, chills, and rebound tenderness.

## 2013-07-20 ENCOUNTER — Observation Stay (HOSPITAL_COMMUNITY): Payer: BC Managed Care – PPO | Admitting: Anesthesiology

## 2013-07-20 ENCOUNTER — Encounter (HOSPITAL_COMMUNITY)
Admission: EM | Disposition: A | Discharge status: Home / Self Care | Payer: Self-pay | Source: Home / Self Care | Attending: Emergency Medicine

## 2013-07-20 ENCOUNTER — Observation Stay (HOSPITAL_COMMUNITY): Payer: BC Managed Care – PPO

## 2013-07-20 ENCOUNTER — Encounter (HOSPITAL_COMMUNITY): Payer: Self-pay | Admitting: Anesthesiology

## 2013-07-20 HISTORY — PX: CHOLECYSTECTOMY: SHX55

## 2013-07-20 LAB — URINALYSIS, ROUTINE W REFLEX MICROSCOPIC
Glucose, UA: >1000 mg/dL — AB
Nitrite: NEGATIVE
pH: 6.5 (ref 5.0–8.0)

## 2013-07-20 LAB — COMPREHENSIVE METABOLIC PANEL
ALT: 26 U/L (ref 0–35)
Albumin: 2.5 g/dL — ABNORMAL LOW (ref 3.5–5.2)
Alkaline Phosphatase: 73 U/L (ref 39–117)
BUN: 4 mg/dL — ABNORMAL LOW (ref 6–23)
Chloride: 99 mEq/L (ref 96–112)
GFR calc Af Amer: >90 mL/min (ref >90)
Glucose, Bld: 108 mg/dL — ABNORMAL HIGH (ref 70–99)
Potassium: 3.9 mEq/L (ref 3.5–5.1)
Sodium: 136 mEq/L (ref 135–145)
Total Bilirubin: 0.4 mg/dL (ref 0.3–1.2)
Total Protein: 5.6 g/dL — ABNORMAL LOW (ref 6.0–8.3)

## 2013-07-20 LAB — URINE MICROSCOPIC-ADD ON

## 2013-07-20 LAB — GLUCOSE, CAPILLARY
Glucose-Capillary: 84 mg/dL (ref 70–99)
Glucose-Capillary: 107 mg/dL — ABNORMAL HIGH (ref 70–99)
Glucose-Capillary: 222 mg/dL — ABNORMAL HIGH (ref 70–99)

## 2013-07-20 LAB — CBC
HCT: 34.8 % — ABNORMAL LOW (ref 36.0–46.0)
Hemoglobin: 11.6 g/dL — ABNORMAL LOW (ref 12.0–15.0)
MCHC: 33.3 g/dL (ref 30.0–36.0)
WBC: 8.2 10*3/uL (ref 4.0–10.5)

## 2013-07-20 LAB — LIPASE, BLOOD: Lipase: 14 U/L (ref 11–59)

## 2013-07-20 SURGERY — LAPAROSCOPIC CHOLECYSTECTOMY WITH INTRAOPERATIVE CHOLANGIOGRAM
Anesthesia: General | Site: Abdomen | Wound class: Clean Contaminated

## 2013-07-20 MED ORDER — PROPOFOL 10 MG/ML IV BOLUS
INTRAVENOUS | Status: DC | PRN
  Administered 2013-07-20: 150 mg via INTRAVENOUS

## 2013-07-20 MED ORDER — BUPIVACAINE HCL (PF) 0.25 % IJ SOLN
INTRAMUSCULAR | Status: DC | PRN
  Administered 2013-07-20: 25 mL

## 2013-07-20 MED ORDER — LACTATED RINGERS IV SOLN
INTRAVENOUS | Status: DC | PRN
  Administered 2013-07-20: 1000 mL

## 2013-07-20 MED ORDER — DEXAMETHASONE SODIUM PHOSPHATE 10 MG/ML IJ SOLN
INTRAMUSCULAR | Status: DC | PRN
  Administered 2013-07-20: 10 mg via INTRAVENOUS

## 2013-07-20 MED ORDER — CISATRACURIUM BESYLATE (PF) 10 MG/5ML IV SOLN
INTRAVENOUS | Status: DC | PRN
  Administered 2013-07-20: 6 mg via INTRAVENOUS

## 2013-07-20 MED ORDER — ONDANSETRON HCL 4 MG/2ML IJ SOLN
INTRAMUSCULAR | Status: DC | PRN
  Administered 2013-07-20: 4 mg via INTRAVENOUS

## 2013-07-20 MED ORDER — POTASSIUM CHLORIDE IN NACL 20-0.9 MEQ/L-% IV SOLN
INTRAVENOUS | Status: DC
  Administered 2013-07-20 – 2013-07-21 (×2): via INTRAVENOUS
  Filled 2013-07-20 (×3): qty 1000

## 2013-07-20 MED ORDER — LACTATED RINGERS IV SOLN: INTRAVENOUS | Status: DC

## 2013-07-20 MED ORDER — NEOSTIGMINE METHYLSULFATE 1 MG/ML IJ SOLN
INTRAMUSCULAR | Status: DC | PRN
  Administered 2013-07-20: 4 mg via INTRAVENOUS

## 2013-07-20 MED ORDER — MEPERIDINE HCL 50 MG/ML IJ SOLN: 6.2500 mg | INTRAMUSCULAR | Status: DC | PRN

## 2013-07-20 MED ORDER — GLYCOPYRROLATE 0.2 MG/ML IJ SOLN
INTRAMUSCULAR | Status: DC | PRN
  Administered 2013-07-20: 0.6 mg via INTRAVENOUS

## 2013-07-20 MED ORDER — SODIUM CHLORIDE 0.9 % IV SOLN
3.0000 g | Freq: Four times a day (QID) | INTRAVENOUS | Status: AC
  Administered 2013-07-20 – 2013-07-21 (×3): 3 g via INTRAVENOUS
  Filled 2013-07-20 (×3): qty 3

## 2013-07-20 MED ORDER — AMPHETAMINE-DEXTROAMPHET ER 20 MG PO CP24: 20.0000 mg | ORAL_CAPSULE | ORAL | Status: DC

## 2013-07-20 MED ORDER — FENTANYL CITRATE 0.05 MG/ML IJ SOLN: 25.0000 ug | INTRAMUSCULAR | Status: DC | PRN

## 2013-07-20 MED ORDER — SUCCINYLCHOLINE CHLORIDE 20 MG/ML IJ SOLN
INTRAMUSCULAR | Status: DC | PRN
  Administered 2013-07-20: 100 mg via INTRAVENOUS

## 2013-07-20 MED ORDER — IOHEXOL 300 MG/ML  SOLN
INTRAMUSCULAR | Status: DC | PRN
  Administered 2013-07-20: 8 mL via INTRAVENOUS

## 2013-07-20 MED ORDER — PROMETHAZINE HCL 25 MG/ML IJ SOLN: 6.2500 mg | INTRAMUSCULAR | Status: DC | PRN

## 2013-07-20 MED ORDER — LAMOTRIGINE 100 MG PO TABS
100.0000 mg | ORAL_TABLET | Freq: Every day | ORAL | Status: DC
  Filled 2013-07-20 (×3): qty 1

## 2013-07-20 MED ORDER — GABAPENTIN 300 MG PO CAPS
600.0000 mg | ORAL_CAPSULE | Freq: Every day | ORAL | Status: DC
  Filled 2013-07-20 (×3): qty 2

## 2013-07-20 MED ORDER — GABAPENTIN 600 MG PO TABS: 600.0000 mg | ORAL_TABLET | Freq: Every day | ORAL | Status: DC

## 2013-07-20 MED ORDER — FENTANYL CITRATE 0.05 MG/ML IJ SOLN
INTRAMUSCULAR | Status: DC | PRN
  Administered 2013-07-20: 100 ug via INTRAVENOUS
  Administered 2013-07-20 (×2): 50 ug via INTRAVENOUS

## 2013-07-20 MED ORDER — 0.9 % SODIUM CHLORIDE (POUR BTL) OPTIME
TOPICAL | Status: DC | PRN
  Administered 2013-07-20: 1000 mL

## 2013-07-20 MED ORDER — MIDAZOLAM HCL 5 MG/5ML IJ SOLN
INTRAMUSCULAR | Status: DC | PRN
  Administered 2013-07-20: 2 mg via INTRAVENOUS

## 2013-07-20 MED ORDER — LACTATED RINGERS IV SOLN
INTRAVENOUS | Status: DC | PRN
  Administered 2013-07-20 (×2): via INTRAVENOUS

## 2013-07-20 SURGICAL SUPPLY — 49 items
ADH SKN CLS APL DERMABOND .7 (GAUZE/BANDAGES/DRESSINGS) ×1
APL SKNCLS STERI-STRIP NONHPOA (GAUZE/BANDAGES/DRESSINGS)
APPLIER CLIP ROT 10 11.4 M/L (STAPLE) ×2
APR CLP MED LRG 11.4X10 (STAPLE) ×1
BAG SPEC RTRVL LRG 6X4 10 (ENDOMECHANICALS) ×1
BENZOIN TINCTURE PRP APPL 2/3 (GAUZE/BANDAGES/DRESSINGS) ×1 IMPLANT
CANISTER SUCTION 2500CC (MISCELLANEOUS) ×2 IMPLANT
CHLORAPREP W/TINT 26ML (MISCELLANEOUS) ×2 IMPLANT
CHOLANGIOGRAM CATH TAUT (CATHETERS) ×2 IMPLANT
CLIP APPLIE ROT 10 11.4 M/L (STAPLE) ×1 IMPLANT
CLOTH BEACON ORANGE TIMEOUT ST (SAFETY) ×2 IMPLANT
COVER MAYO STAND STRL (DRAPES) ×1 IMPLANT
DECANTER SPIKE VIAL GLASS SM (MISCELLANEOUS) ×1 IMPLANT
DERMABOND ADVANCED (GAUZE/BANDAGES/DRESSINGS) ×1
DERMABOND ADVANCED .7 DNX12 (GAUZE/BANDAGES/DRESSINGS) IMPLANT
DRAPE C-ARM 42X120 X-RAY (DRAPES) ×1 IMPLANT
DRAPE LAPAROSCOPIC ABDOMINAL (DRAPES) ×2 IMPLANT
ELECT REM PT RETURN 9FT ADLT (ELECTROSURGICAL) ×2
ELECTRODE REM PT RTRN 9FT ADLT (ELECTROSURGICAL) ×1 IMPLANT
GLOVE BIOGEL PI IND STRL 7.0 (GLOVE) ×1 IMPLANT
GLOVE BIOGEL PI INDICATOR 7.0 (GLOVE) ×2
GLOVE SS BIOGEL STRL SZ 7 (GLOVE) IMPLANT
GLOVE SUPERSENSE BIOGEL SZ 7 (GLOVE) ×1
GLOVE SURG SIGNA 7.5 PF LTX (GLOVE) ×2 IMPLANT
GLOVE SURG SS PI 7.0 STRL IVOR (GLOVE) ×2 IMPLANT
GOWN STRL NON-REIN LRG LVL3 (GOWN DISPOSABLE) ×2 IMPLANT
GOWN STRL REIN 3XL XLG LVL4 (GOWN DISPOSABLE) ×1 IMPLANT
GOWN STRL REIN XL XLG (GOWN DISPOSABLE) ×3 IMPLANT
HEMOSTAT SURGICEL 4X8 (HEMOSTASIS) IMPLANT
IV CATH 14GX2 1/4 (CATHETERS) ×2 IMPLANT
IV SET EXT 30 76VOL 4 MALE LL (IV SETS) ×2 IMPLANT
KIT BASIN OR (CUSTOM PROCEDURE TRAY) ×2 IMPLANT
NS IRRIG 1000ML POUR BTL (IV SOLUTION) ×1 IMPLANT
POUCH SPECIMEN RETRIEVAL 10MM (ENDOMECHANICALS) ×1 IMPLANT
SET IRRIG TUBING LAPAROSCOPIC (IRRIGATION / IRRIGATOR) ×2 IMPLANT
SOLUTION ANTI FOG 6CC (MISCELLANEOUS) ×2 IMPLANT
STOPCOCK K 69 2C6206 (IV SETS) ×2 IMPLANT
STRIP CLOSURE SKIN 1/4X4 (GAUZE/BANDAGES/DRESSINGS) ×1 IMPLANT
SUT VIC AB 5-0 PS2 18 (SUTURE) ×2 IMPLANT
TOWEL OR 17X26 10 PK STRL BLUE (TOWEL DISPOSABLE) ×4 IMPLANT
TOWEL OR NON WOVEN STRL DISP B (DISPOSABLE) ×1 IMPLANT
TRAY LAP CHOLE (CUSTOM PROCEDURE TRAY) ×2 IMPLANT
TROCAR BLADELESS OPT 5 100 (ENDOMECHANICALS) ×4 IMPLANT
TROCAR SLEEVE XCEL 5X75 (ENDOMECHANICALS) ×3 IMPLANT
TROCAR XCEL BLUNT TIP 100MML (ENDOMECHANICALS) ×1 IMPLANT
TROCAR XCEL NON-BLD 11X100MML (ENDOMECHANICALS) ×2 IMPLANT
TROCAR XCEL UNIV SLVE 11M 100M (ENDOMECHANICALS) IMPLANT
TUBING INSUFFLATION 10FT LAP (TUBING) ×2 IMPLANT
WATER STERILE IRR 1500ML POUR (IV SOLUTION) ×1 IMPLANT

## 2013-07-20 NOTE — Op Note (Addendum)
07/19/2013 - 07/20/2013  9:26 AM  PATIENT:  Emily Graham, 52 y.o., female, MRN: [id number]  PREOP DIAGNOSIS:  cholelithiasis  POSTOP DIAGNOSIS:   Chronic cholecystitis, cholelithiasis  PROCEDURE:   Procedure(s): LAPAROSCOPIC CHOLECYSTECTOMY WITH INTRAOPERATIVE CHOLANGIOGRAM  SURGEON:   Alphonsa Overall, M.D.  ASSISTANT:   None  ANESTHESIA:   general  Anesthesiologist: Montez Hageman, MD CRNA: Bailey Mech, CRNA  General  ASA: @asa @  EBL:  minimal  ml  BLOOD ADMINISTERED: none  DRAINS: none   LOCAL MEDICATIONS USED:   20 cc 1/4% marcaine  SPECIMEN:   Gall bladder  COUNTS CORRECT:  YES  INDICATIONS FOR PROCEDURE:  Emily Graham is a 52 y.o. (DOB: 1960-12-18) white  female whose primary care physician is Alesia Richards, MD and comes for cholecystectomy.  The patient presented through the [facility] yesterday with the signs and symptoms of acute cholecystitis.  So she was admitted overnight, hydrated, placed on antibiotics, and comes for surgery today.   The indications and risks of the gall bladder surgery were explained to the patient.  The risks include, but are not limited to, infection, bleeding, common bile duct injury and open surgery.  SURGERY:  The patient was taken to room #6 at [facility].  The abdomen was prepped with chloroprep.  The patient was on Unasyn as an antibiotic.   A time out was held and the surgical checklist run.   The abdomen was accessed with a 5 mm Ethicon Optiview in the right upper quadrant.  Three additional trocars were inserted: a 10 mm trocar in the sub-xiphoid location, a 5 mm trocar in the right mid subcostal area, and a 5 mm trocar below the umbilicus.   The abdomen was explored and the liver, stomach, and bowel that could be seen were unremarkable.   The gall bladder had scarring around it with adhesions covering 80% of the gall bladder.  Disssection was carried down to the gall bladder/cystic duct junction and the cystic duct isolated.   A clip was placed on the gall bladder side of the cystic duct.   An intra-operative cholangiogram was shot.   The intra-operative cholangiogram was shot using a cut off Taut catheter placed through a 14 gauge angiocath in the RUQ.  The Taut catheter was inserted in the cut cystic duct and secured with an endoclip.  A cholangiogram was shot with 10 cc of 1/2 strength Omnipaque.  Using fluoroscopy, the cholangiogram showed the flow of contrast into the common bile duct, up the hepatic radicals, and into the duodenum.  There was no mass or obstruction.  This was a normal intra-operative cholangiogram.   The Taut catheter was removed.  The cystic duct was tripley endoclipped and the cystic artery was identified and clipped.  The gall bladder was bluntly and sharpley dissected from the gall bladder bed.   After the gall bladder was removed from the liver, the gall bladder bed and Tri[place] of [place] were inspected.  There was no bleeding or bile leak.  The gall bladder was placed in a endocatch bag and delivered through the sub xiphoid incision.  The abdomen was irrigated with 500 cc saline.   The trocars were then removed.  I infiltrated 20cc of 1/4% Marcaine into the incisions.  All the skin incision sites were closed with 5-0 vicryl.  The skin was painted with Dermabond.  The patient's sponge and needle count were correct.  The patient was transported to the RR in good condition.  Alphonsa Overall, MD, FACS  [facility] Pager: [phone] Office phone:  [phone]

## 2013-07-20 NOTE — Anesthesia Postprocedure Evaluation (Signed)
  Anesthesia Post-op Note  Patient: Emily Graham  Procedure(s) Performed: Procedure(s) (LRB): LAPAROSCOPIC CHOLECYSTECTOMY WITH INTRAOPERATIVE CHOLANGIOGRAM (N/A)  Patient Location: PACU  Anesthesia Type: General  Level of Consciousness: awake and alert   Airway and Oxygen Therapy: Patient Spontanous Breathing  Post-op Pain: mild  Post-op Assessment: Post-op Vital signs reviewed, Patient's Cardiovascular Status Stable, Respiratory Function Stable, Patent Airway and No signs of Nausea or vomiting  Last Vitals:  Filed Vitals:   07/20/13 1045  BP: 106/81  Pulse: 73  Temp: 36.9 C  Resp: 18    Post-op Vital Signs: stable   Complications: No apparent anesthesia complications

## 2013-07-20 NOTE — Progress Notes (Signed)
Dr. Marcello Moores aware via phone pt's recent CBG 222. Pt asymptomatic. MD aware pt's diet advanced to regular at lunch and tolerating trays well. No new orders received.

## 2013-07-20 NOTE — Transfer of Care (Signed)
Immediate Anesthesia Transfer of Care Note  Patient: Emily Graham  Procedure(s) Performed: Procedure(s): LAPAROSCOPIC CHOLECYSTECTOMY WITH INTRAOPERATIVE CHOLANGIOGRAM (N/A)  Patient Location: PACU  Anesthesia Type:General  Level of Consciousness: awake, sedated and patient cooperative  Airway & Oxygen Therapy: Patient Spontanous Breathing and Patient connected to face mask oxygen  Post-op Assessment: Report given to PACU RN and Post -op Vital signs reviewed and stable  Post vital signs: Reviewed and stable  Complications: No apparent anesthesia complications

## 2013-07-20 NOTE — Anesthesia Preprocedure Evaluation (Addendum)
Anesthesia Evaluation  Patient identified by MRN, date of birth, ID band Patient awake    Reviewed: Allergy & Precautions, H&P , NPO status , Patient's Chart, lab work & pertinent test results  Airway Mallampati: II TM Distance: >3 FB Neck ROM: Full    Dental no notable dental hx.    Pulmonary neg pulmonary ROS,  breath sounds clear to auscultation  Pulmonary exam normal       Cardiovascular hypertension, Pt. on medications Rhythm:Regular Rate:Normal     Neuro/Psych Seizures -, Well Controlled,  negative psych ROS   GI/Hepatic negative GI ROS, Neg liver ROS,   Endo/Other  negative endocrine ROSneg diabetes  Renal/GU negative Renal ROS  negative genitourinary   Musculoskeletal negative musculoskeletal ROS (+)   Abdominal   Peds negative pediatric ROS (+)  Hematology negative hematology ROS (+)   Anesthesia Other Findings   Reproductive/Obstetrics negative OB ROS                          Anesthesia Physical Anesthesia Plan  ASA: II  Anesthesia Plan: General   Post-op Pain Management:    Induction: Intravenous  Airway Management Planned: Oral ETT  Additional Equipment:   Intra-op Plan:   Post-operative Plan: Extubation in OR  Informed Consent: I have reviewed the patients History and Physical, chart, labs and discussed the procedure including the risks, benefits and alternatives for the proposed anesthesia with the patient or authorized representative who has indicated his/her understanding and acceptance.   Dental advisory given  Plan Discussed with: CRNA  Anesthesia Plan Comments:         Anesthesia Quick Evaluation

## 2013-07-21 ENCOUNTER — Encounter (HOSPITAL_COMMUNITY): Payer: Self-pay | Admitting: Surgery

## 2013-07-21 LAB — GLUCOSE, CAPILLARY: Glucose-Capillary: 147 mg/dL — ABNORMAL HIGH (ref 70–99)

## 2013-07-21 MED ORDER — OXYCODONE-ACETAMINOPHEN 5-325 MG PO TABS: 1.0000 | ORAL_TABLET | ORAL | Status: DC | PRN

## 2013-07-21 NOTE — Discharge Summary (Signed)
Patient ID: Emily Graham MRN: [id number] DOB/AGE: [birth year] 52 y.o.  Admit date: 07/19/2013 Discharge date: 07/21/2013  Procedures: lap chole with IOC - 07/20/2013 - D. Zakariah Urwin  Consults: None  Reason for Admission: Pt says she was Ok till 07/16/13 when she started feeling bad and had a fever to 101.6. Sounds like it got better, but she still felt bad. Yesterday she started having pain and fever again. Up to 101.6 again. She has not had nausea or vomiting with this. Her last PO intake was about 8:30-9:00 this AM. She has ongoing pain RUQ going to her back, fever, with tachycardia. Her labs are all normal, but her ABD US shows layering stones, none greater than 4 mm. CBD was 5.1 CM.  We are ask to see.  Admission Diagnoses:  1. Cholecystitis 2. H/o seizures 3. H/o gastric bypass  Hospital Course: The patient was admitted and put on IV abx therapy.  She was taken to the OR the next day where she underwent a lap chole with IOC.  She tolerated the procedure well.  On POD#1, she was tolerating a solid diet and her pain was well controlled.  She was stable for dc home.  PE: Abd: soft, appropriately tender, +BS, ND, incisions c/d/i  Discharge Diagnoses:  Principal Problem:   Cholecystitis, acute with cholelithiasis Active Problems:   Seizure disorder, primary generalized   Hypoglycemia   ADD (attention deficit disorder)   GERD (gastroesophageal reflux disease)   Chronic left shoulder pain s/p lap chole  Discharge Medications:   Medication List         amphetamine-dextroamphetamine 20 MG 24 hr capsule  Commonly known as:  ADDERALL XR  Take 20 mg by mouth every morning.     Arginine 500 MG Caps  Take 500 mg by mouth daily.     aspirin 81 MG tablet  Take 81 mg by mouth daily.     b complex vitamins tablet  Take 1 tablet by mouth daily.     BIOTIN 5000 5 MG Caps  Generic drug:  Biotin  Take 5,000 mg by mouth daily.     fish oil-omega-3 fatty acids 1000 MG capsule  Take 1,200  mg by mouth daily.     gabapentin 600 MG tablet  Commonly known as:  NEURONTIN  Take 600 mg by mouth daily.     Ginkgo Biloba 40 MG Tabs  Take 1 tablet by mouth daily.     glucosamine-chondroitin 500-400 MG tablet  Take 1 tablet by mouth daily.     L-Carnitine 500 MG Tabs  Take 500 mg by mouth daily.     lamoTRIgine 100 MG tablet  Commonly known as:  LAMICTAL  Take 100 mg by mouth daily.     Lecithin 400 MG Caps  Take 400 mg by mouth daily.     milk thistle 175 MG tablet  Take 175 mg by mouth daily.     multivitamin tablet  Take 1 tablet by mouth daily.     naproxen 500 MG tablet  Commonly known as:  NAPROSYN  Take 500 mg by mouth as needed.     oxyCODONE-acetaminophen 5-325 MG per tablet  Commonly known as:  PERCOCET/ROXICET  Take 1-2 tablets by mouth every 4 (four) hours as needed.     polycarbophil 625 MG tablet  Commonly known as:  FIBERCON  Take 625 mg by mouth daily.     VITAMIN D (CHOLECALCIFEROL) PO  Take 1.25 mg by mouth daily.  Discharge Instructions:     Follow-up Information   Follow up with [facility] On 08/08/2013. (12:00pm, arrive no later than 11:30am for paperwork)    Contact information:   [address]   [place] [address] [phone]       Signed: Henreitta Cea 07/21/2013, 10:10 AM  Agree with above.  Alphonsa Overall, MD, [facility] Pager: [phone] Office phone:  [phone]

## 2013-07-21 NOTE — Progress Notes (Signed)
Discharge summary sent to payer through [facility]  

## 2013-07-28 ENCOUNTER — Other Ambulatory Visit: Payer: Self-pay | Admitting: *Deleted

## 2013-07-28 MED ORDER — LAMOTRIGINE 100 MG PO TABS: 100.0000 mg | ORAL_TABLET | Freq: Every day | ORAL | Status: DC

## 2013-08-08 ENCOUNTER — Encounter (INDEPENDENT_AMBULATORY_CARE_PROVIDER_SITE_OTHER): Payer: BC Managed Care – PPO

## 2013-08-22 ENCOUNTER — Ambulatory Visit (INDEPENDENT_AMBULATORY_CARE_PROVIDER_SITE_OTHER): Payer: BC Managed Care – PPO | Admitting: Internal Medicine

## 2013-08-22 ENCOUNTER — Encounter (INDEPENDENT_AMBULATORY_CARE_PROVIDER_SITE_OTHER): Payer: Self-pay | Admitting: Internal Medicine

## 2013-08-22 VITALS — BP 140/84 | HR 80 | Temp 98.4°F | Resp 14 | Ht 62.0 in | Wt 153.8 lb

## 2013-08-22 DIAGNOSIS — K8010 Calculus of gallbladder with chronic cholecystitis without obstruction: Secondary | ICD-10-CM

## 2013-08-22 NOTE — Patient Instructions (Signed)
May resume regular activity without restrictions. Follow up as needed. Call with questions or concerns.

## 2013-08-22 NOTE — Progress Notes (Signed)
  Subjective: Pt returns to the clinic today after undergoing laparoscopic cholecystectomy on 07/20/13 by Dr. Lucia Gaskins.  The patient is tolerating their diet well and is having no severe pain.  Bowel function is good.  No problems with the wounds.  Objective: Vital signs in last 24 hours: Reviewed  PE: Abd: soft, non-tender, +bs, incisions well healed  Lab Results:  No results found for this basename: WBC, HGB, HCT, PLT,  in the last 72 hours BMET No results found for this basename: NA, K, CL, CO2, GLUCOSE, BUN, CREATININE, CALCIUM,  in the last 72 hours PT/INR No results found for this basename: LABPROT, INR,  in the last 72 hours CMP     Component Value Date/Time   NA 136 07/20/2013 0408   K 3.9 07/20/2013 0408   CL 99 07/20/2013 0408   CO2 30 07/20/2013 0408   GLUCOSE 108* 07/20/2013 0408   BUN 4* 07/20/2013 0408   CREATININE 0.54 07/20/2013 0408   CALCIUM 8.2* 07/20/2013 0408   PROT 5.6* 07/20/2013 0408   ALBUMIN 2.5* 07/20/2013 0408   AST 29 07/20/2013 0408   ALT 26 07/20/2013 0408   ALKPHOS 73 07/20/2013 0408   BILITOT 0.4 07/20/2013 0408   GFRNONAA >90 07/20/2013 0408   GFRAA >90 07/20/2013 0408   Lipase     Component Value Date/Time   LIPASE 14 07/20/2013 0408       Studies/Results: No results found.  Anti-infectives: Anti-infectives   None       Assessment/Plan  1.  S/P Laparoscopic Cholecystectomy: doing well, may resume regular activity without restrictions, Pt will follow up with [place] PRN and knows to call with questions or concerns.     Titianna Loomis 08/22/2013

## 2013-10-19 ENCOUNTER — Other Ambulatory Visit: Payer: Self-pay

## 2013-11-21 ENCOUNTER — Encounter: Payer: Self-pay | Admitting: Internal Medicine

## 2013-11-23 ENCOUNTER — Other Ambulatory Visit: Payer: Self-pay | Admitting: Internal Medicine

## 2013-11-23 MED ORDER — PANTOPRAZOLE SODIUM 40 MG PO TBEC: 40.0000 mg | DELAYED_RELEASE_TABLET | Freq: Every day | ORAL | Status: DC

## 2013-11-27 ENCOUNTER — Ambulatory Visit (INDEPENDENT_AMBULATORY_CARE_PROVIDER_SITE_OTHER): Payer: BC Managed Care – PPO | Admitting: Internal Medicine

## 2013-11-27 ENCOUNTER — Other Ambulatory Visit: Payer: Self-pay | Admitting: Internal Medicine

## 2013-11-27 ENCOUNTER — Encounter: Payer: Self-pay | Admitting: Internal Medicine

## 2013-11-27 VITALS — BP 146/88 | HR 88 | Temp 98.6°F | Resp 16 | Ht 62.0 in | Wt 153.8 lb

## 2013-11-27 DIAGNOSIS — R7401 Elevation of levels of liver transaminase levels: Secondary | ICD-10-CM

## 2013-11-27 DIAGNOSIS — Z0000 Encounter for general adult medical examination without abnormal findings: Secondary | ICD-10-CM

## 2013-11-27 DIAGNOSIS — R7402 Elevation of levels of lactic acid dehydrogenase (LDH): Secondary | ICD-10-CM

## 2013-11-27 DIAGNOSIS — Z1212 Encounter for screening for malignant neoplasm of rectum: Secondary | ICD-10-CM

## 2013-11-27 DIAGNOSIS — E559 Vitamin D deficiency, unspecified: Secondary | ICD-10-CM

## 2013-11-27 DIAGNOSIS — R7309 Other abnormal glucose: Secondary | ICD-10-CM

## 2013-11-27 DIAGNOSIS — Z113 Encounter for screening for infections with a predominantly sexual mode of transmission: Secondary | ICD-10-CM

## 2013-11-27 DIAGNOSIS — I10 Essential (primary) hypertension: Secondary | ICD-10-CM

## 2013-11-27 DIAGNOSIS — Z79899 Other long term (current) drug therapy: Secondary | ICD-10-CM

## 2013-11-27 LAB — BASIC METABOLIC PANEL WITH GFR
CO2: 28 mEq/L (ref 19–32)
Chloride: 105 mEq/L (ref 96–112)
Sodium: 143 mEq/L (ref 135–145)

## 2013-11-27 LAB — CBC WITH DIFFERENTIAL/PLATELET
Lymphocytes Relative: 29 % (ref 12–46)
Lymphs Abs: 2.1 10*3/uL (ref 0.7–4.0)
Neutro Abs: 4.2 10*3/uL (ref 1.7–7.7)
Neutrophils Relative %: 59 % (ref 43–77)
Platelets: 278 10*3/uL (ref 150–400)
RBC: 4.66 MIL/uL (ref 3.87–5.11)
WBC: 7.1 10*3/uL (ref 4.0–10.5)

## 2013-11-27 LAB — LIPID PANEL
HDL: 67 mg/dL (ref >39)
Total CHOL/HDL Ratio: 2.5 Ratio
Triglycerides: 71 mg/dL (ref <150)

## 2013-11-27 LAB — HEPATIC FUNCTION PANEL
AST: 27 U/L (ref 0–37)
Alkaline Phosphatase: 83 U/L (ref 39–117)
Bilirubin, Direct: 0.1 mg/dL (ref 0.0–0.3)
Total Bilirubin: 0.4 mg/dL (ref 0.3–1.2)

## 2013-11-27 LAB — TSH: TSH: 2.226 u[IU]/mL (ref 0.350–4.500)

## 2013-11-27 LAB — MAGNESIUM: Magnesium: 2.3 mg/dL (ref 1.5–2.5)

## 2013-11-27 MED ORDER — GABAPENTIN 600 MG PO TABS: 600.0000 mg | ORAL_TABLET | Freq: Three times a day (TID) | ORAL | Status: DC

## 2013-11-27 MED ORDER — PANTOPRAZOLE SODIUM 40 MG PO TBEC: DELAYED_RELEASE_TABLET | ORAL | Status: DC

## 2013-11-27 NOTE — Progress Notes (Signed)
Patient ID: Emily Graham, female   DOB: Aug 03, 1961, 52 y.o.   MRN: [id number]  Annual Screening Comprehensive Examination  This very nice 52yo MWF presents for complete physical.  Patient has been followed for labile HTN since 2000 - monitored expectantly, Hx/o Prediabetes, Hyperlipidemia,GERD  and Vitamin D Deficiency.  Patient has prediabetes/insulin resistance predating to 2000 when she had a + GTT at a weight of 247 # (current weight is 153.8 #). She underwent Gastric Bipass in November 2000. She does have Hx/o seizures attributed to severe reactive hypoglycemia in the past. In September her A21c was %.0% with a normal insulin of 6.Patient denies any recent reactive hypoglycemic symptoms, visual blurring, diabetic polys, or paresthesias    Patient denies any cardiac symptoms as chest pain, palpitations, shortness of breath, dizziness or ankle swelling.In October 2013 she had a negative ETT by Dr Stanford Breed.   Patient's hyperlipidemia is controlled with diet and medications. Patient denies myalgias or other medication SE's. Last cholesterol last visit was 197, triglycerides 63, HDL 72 and LDL 112 in September.     Finally, patient has history of Vitamin D Deficiency with last vitamin D 65 in June.     Current Outpatient Prescriptions on File Prior to Visit  Medication Sig Dispense Refill  . amphetamine-dextroamphetamine (ADDERALL XR) 20 MG 24 hr capsule Take 20 mg by mouth every morning.      . Arginine 500 MG CAPS Take 500 mg by mouth daily.      Marland Kitchen aspirin 81 MG tablet Take 81 mg by mouth daily.      Marland Kitchen b complex vitamins tablet Take 1 tablet by mouth daily.      . Biotin (BIOTIN 5000) 5 MG CAPS Take 5,000 mg by mouth daily.      . fish oil-omega-3 fatty acids 1000 MG capsule Take 1,200 mg by mouth daily.      . Ginkgo Biloba 40 MG TABS Take 1 tablet by mouth daily.      Marland Kitchen glucosamine-chondroitin 500-400 MG tablet Take 1 tablet by mouth daily.      . Lecithin 400 MG CAPS Take 400 mg by  mouth daily.      . LevOCARNitine (L-CARNITINE) 500 MG TABS Take 500 mg by mouth daily.      . milk thistle 175 MG tablet Take 175 mg by mouth daily.      . Multiple Vitamin (MULTIVITAMIN) tablet Take 1 tablet by mouth daily.      . naproxen (NAPROSYN) 500 MG tablet Take 500 mg by mouth as needed.      . polycarbophil (FIBERCON) 625 MG tablet Take 625 mg by mouth daily.      Marland Kitchen VITAMIN D, CHOLECALCIFEROL, PO Take 1.25 mg by mouth daily.       No current facility-administered medications on file prior to visit.    No Known Allergies  Past Medical History  Diagnosis Date  . HTN (hypertension)   . Hypoglycemia   . Goiter   . Murmur   . Dyspnea   . Cholecystitis, acute with cholelithiasis 07/19/2013  . Seizure disorder, primary generalized 07/19/2013    May be related to hypoglycemia  . Hypoglycemia 07/19/2013  . ADD (attention deficit disorder) 07/19/2013  . GERD (gastroesophageal reflux disease) 07/19/2013  . Chronic left shoulder pain 07/19/2013  . Arthritis   . Seizures   . Pre-diabetes     Past Surgical History  Procedure Laterality Date  . Gastric bypass    . Abdominal hysterectomy    .  Breast enhancement surgery    . Total shoulder replacement    . Stomach tuck    . Cholecystectomy N/A 07/20/2013    Procedure: LAPAROSCOPIC CHOLECYSTECTOMY WITH INTRAOPERATIVE CHOLANGIOGRAM;  Surgeon: Shann Medal, MD;  Location: [facility];  Service: General;  Laterality: N/A;  . Gastric bypass      Family History  Problem Relation Age of Onset  . Coronary artery disease Father     MI at age 59  . Heart disease Father   . Hypertension Father   . Diabetes Father   . Cancer Mother     skin  . Hypertension Mother   . Hyperlipidemia Mother   . Cancer Maternal Aunt     breast  . Cancer Paternal Aunt     breast  . Cancer Paternal Uncle     breast  . Cancer Maternal Aunt   . Cancer Cousin     History  Substance Use Topics  . Smoking status: Never Smoker   . Smokeless tobacco: Never Used   . Alcohol Use: 0.5 oz/week    1 drink(s) per week     Comment: Occasional    ROS Constitutional: Denies fever, chills, weight loss/gain, headaches, insomnia, fatigue, night sweats, and change in appetite. Eyes: Denies redness, blurred vision, diplopia, discharge, itchy, watery eyes.  ENT: Denies discharge, congestion, post nasal drip, epistaxis, sore throat, earache, hearing loss, dental pain, Tinnitus, Vertigo, Sinus pain, snoring.  Cardio: Denies chest pain, palpitations, irregular heartbeat, syncope, dyspnea, diaphoresis, orthopnea, PND, claudication, edema Respiratory: denies cough, dyspnea, DOE, pleurisy, hoarseness, laryngitis, wheezing.  Gastrointestinal: Denies dysphagia, heartburn, reflux, water brash, pain, cramps, nausea, vomiting, bloating, diarrhea, constipation, hematemesis, melena, hematochezia, jaundice, hemorrhoids Genitourinary: Denies dysuria, frequency, urgency, nocturia, hesitancy, discharge, hematuria, flank pain Breast:Breast lumps, nipple discharge, bleeding.  Musculoskeletal: Denies arthralgia, myalgia, stiffness, Jt. Swelling, pain, limp, and strain/sprain. Skin: Denies puritis, rash, hives, warts, acne, eczema, changing in skin lesion Neuro: No weakness, tremor, incoordination, spasms, paresthesia, pain Psychiatric: Denies confusion, memory loss, sensory loss Endocrine: Denies change in weight, skin, hair change, nocturia, and paresthesia, diabetic polys, visual blurring, hyper / hypo glycemic episodes.  Heme/Lymph: No excessive bleeding, bruising, enlarged lymph nodes.  Filed Vitals:   11/27/13 1356  BP: 146/88  Pulse: 88  Temp: 98.6 F (37 C)  Resp: 16    Estimated body mass index is 28.12 kg/(m^2) as calculated from the following:   Height as of this encounter: 5' 2"  (1.575 m).   Weight as of this encounter: 153 lb 12.8 oz (69.763 kg).  Physical Exam General Appearance: Well nourished, in no apparent distress. Eyes: PERRLA, EOMs, conjunctiva no  swelling or erythema, normal fundi and vessels. Sinuses: No frontal/maxillary tenderness ENT/Mouth: EACs patent / TMs  nl. Nares clear without erythema, swelling, mucoid exudates. Oral hygiene is good. No erythema, swelling, or exudate. Tongue normal, non-obstructing. Tonsils not swollen or erythematous. Hearing normal.  Neck: Supple, thyroid normal. No bruits, nodes or JVD. Respiratory: Respiratory effort normal.  BS equal and clear bilateral without rales, rhonci, wheezing or stridor. Cardio: Heart sounds are normal with regular rate and rhythm and no murmurs, rubs or gallops. Peripheral pulses are normal and equal bilaterally without edema. No aortic or femoral bruits. Chest: symmetric with normal excursions and percussion. Breasts: Symmetric with palpable implants without lumps, nipple discharge, retractions, or fibrocystic changes.  Abdomen: Flat, soft, with bowl sounds. Nontender, no guarding, rebound, hernias, masses, or organomegaly.  Lymphatics: Non tender without lymphadenopathy.  Musculoskeletal: Full ROM all peripheral extremities,  joint stability, 5/5 strength, and normal gait. Skin: Warm and dry without rashes, lesions, cyanosis, clubbing or  ecchymosis.  Neuro: Cranial nerves intact, reflexes equal bilaterally. Normal muscle tone, no cerebellar symptoms. Sensation intact.  Pysch: Awake and oriented X 3, normal affect, Insight and Judgment appropriate.   Assessment and Plan  1. Annual Screening Examination 2. Hx/o Elevated BP ,labile 3. Hyperlipidemia 4. Pre Diabetes, Hx/o 5. Vitamin D Deficiency  Continue prudent diet as discussed, weight control, BP monitoring, regular exercise, and medications. Discussed med's effects and SE's. Screening labs and tests as requested with regular follow-up as recommended.

## 2013-11-27 NOTE — Patient Instructions (Signed)

## 2013-11-28 LAB — HEPATITIS A ANTIBODY, TOTAL: Hep A Total Ab: NONREACTIVE

## 2013-11-28 LAB — URINALYSIS, MICROSCOPIC ONLY

## 2013-11-28 LAB — HEPATITIS C ANTIBODY: HCV Ab: NEGATIVE

## 2013-11-28 LAB — HIV ANTIBODY (ROUTINE TESTING W REFLEX): HIV: NONREACTIVE

## 2013-11-28 LAB — MICROALBUMIN / CREATININE URINE RATIO
Creatinine, Urine: 159.3 mg/dL
Microalb, Ur: 2.67 mg/dL — ABNORMAL HIGH (ref 0.00–1.89)

## 2013-11-28 LAB — VITAMIN D 25 HYDROXY (VIT D DEFICIENCY, FRACTURES): Vit D, 25-Hydroxy: 77 ng/mL (ref 30–89)

## 2013-12-01 LAB — URINE CULTURE: Colony Count: >=100000

## 2013-12-02 ENCOUNTER — Other Ambulatory Visit: Payer: Self-pay | Admitting: Internal Medicine

## 2013-12-02 DIAGNOSIS — N3000 Acute cystitis without hematuria: Secondary | ICD-10-CM

## 2013-12-02 MED ORDER — CIPROFLOXACIN HCL 250 MG PO TABS: 250.0000 mg | ORAL_TABLET | Freq: Two times a day (BID) | ORAL | Status: AC

## 2013-12-13 ENCOUNTER — Other Ambulatory Visit: Payer: Self-pay | Admitting: Emergency Medicine

## 2013-12-13 MED ORDER — AMPHETAMINE-DEXTROAMPHET ER 20 MG PO CP24: 20.0000 mg | ORAL_CAPSULE | ORAL | Status: DC

## 2014-01-03 ENCOUNTER — Other Ambulatory Visit: Payer: Self-pay | Admitting: Physician Assistant

## 2014-01-03 MED ORDER — VITAMIN D (ERGOCALCIFEROL) 1.25 MG (50000 UNIT) PO CAPS: 50000.0000 [IU] | ORAL_CAPSULE | ORAL | Status: DC

## 2014-02-07 ENCOUNTER — Other Ambulatory Visit: Payer: Self-pay | Admitting: Emergency Medicine

## 2014-02-07 MED ORDER — AMPHETAMINE-DEXTROAMPHET ER 20 MG PO CP24: 20.0000 mg | ORAL_CAPSULE | ORAL | Status: DC

## 2014-02-28 ENCOUNTER — Ambulatory Visit: Payer: Self-pay | Admitting: Physician Assistant

## 2014-03-20 ENCOUNTER — Ambulatory Visit: Payer: Self-pay | Admitting: Physician Assistant

## 2014-03-21 ENCOUNTER — Encounter: Payer: Self-pay | Admitting: Physician Assistant

## 2014-03-21 ENCOUNTER — Ambulatory Visit (INDEPENDENT_AMBULATORY_CARE_PROVIDER_SITE_OTHER): Payer: BC Managed Care – PPO | Admitting: Physician Assistant

## 2014-03-21 VITALS — BP 120/78 | HR 80 | Temp 98.4°F | Resp 16 | Ht 63.0 in | Wt 157.0 lb

## 2014-03-21 DIAGNOSIS — I10 Essential (primary) hypertension: Secondary | ICD-10-CM

## 2014-03-21 DIAGNOSIS — R7309 Other abnormal glucose: Secondary | ICD-10-CM

## 2014-03-21 DIAGNOSIS — Z79899 Other long term (current) drug therapy: Secondary | ICD-10-CM

## 2014-03-21 DIAGNOSIS — E785 Hyperlipidemia, unspecified: Secondary | ICD-10-CM

## 2014-03-21 DIAGNOSIS — E782 Mixed hyperlipidemia: Secondary | ICD-10-CM

## 2014-03-21 DIAGNOSIS — E559 Vitamin D deficiency, unspecified: Secondary | ICD-10-CM

## 2014-03-21 DIAGNOSIS — K219 Gastro-esophageal reflux disease without esophagitis: Secondary | ICD-10-CM

## 2014-03-21 LAB — CBC WITH DIFFERENTIAL/PLATELET
Basophils Absolute: 0 10*3/uL (ref 0.0–0.1)
Basophils Relative: 0 % (ref 0–1)
EOS ABS: 0.3 10*3/uL (ref 0.0–0.7)
EOS PCT: 4 % (ref 0–5)
HCT: 42.8 % (ref 36.0–46.0)
HEMOGLOBIN: 14.3 g/dL (ref 12.0–15.0)
LYMPHS ABS: 1.9 10*3/uL (ref 0.7–4.0)
Lymphocytes Relative: 28 % (ref 12–46)
MCH: 30 pg (ref 26.0–34.0)
MCHC: 33.4 g/dL (ref 30.0–36.0)
MCV: 89.9 fL (ref 78.0–100.0)
MONOS PCT: 10 % (ref 3–12)
Monocytes Absolute: 0.7 10*3/uL (ref 0.1–1.0)
Neutro Abs: 3.9 10*3/uL (ref 1.7–7.7)
Neutrophils Relative %: 58 % (ref 43–77)
Platelets: 257 10*3/uL (ref 150–400)
RBC: 4.76 MIL/uL (ref 3.87–5.11)
RDW: 13.6 % (ref 11.5–15.5)
WBC: 6.7 10*3/uL (ref 4.0–10.5)

## 2014-03-21 NOTE — Patient Instructions (Signed)
Hemorrhoids Hemorrhoids are swollen veins around the rectum or anus. There are two types of hemorrhoids:   Internal hemorrhoids. These occur in the veins just inside the rectum. They may poke through to the outside and become irritated and painful.  External hemorrhoids. These occur in the veins outside the anus and can be felt as a painful swelling or hard lump near the anus. CAUSES  Pregnancy.   Obesity.   Constipation or diarrhea.   Straining to have a bowel movement.   Sitting for long periods on the toilet.  Heavy lifting or other activity that caused you to strain.  Anal intercourse. SYMPTOMS   Pain.   Anal itching or irritation.   Rectal bleeding.   Fecal leakage.   Anal swelling.   One or more lumps around the anus.  DIAGNOSIS  Your caregiver may be able to diagnose hemorrhoids by visual examination. Other examinations or tests that may be performed include:   Examination of the rectal area with a gloved hand (digital rectal exam).   Examination of anal canal using a small tube (scope).   A blood test if you have lost a significant amount of blood.  A test to look inside the colon (sigmoidoscopy or colonoscopy). TREATMENT Most hemorrhoids can be treated at home. However, if symptoms do not seem to be getting better or if you have a lot of rectal bleeding, your caregiver may perform a procedure to help make the hemorrhoids get smaller or remove them completely. Possible treatments include:   Placing a rubber band at the base of the hemorrhoid to cut off the circulation (rubber band ligation).   Injecting a chemical to shrink the hemorrhoid (sclerotherapy).   Using a tool to burn the hemorrhoid (infrared light therapy).   Surgically removing the hemorrhoid (hemorrhoidectomy).   Stapling the hemorrhoid to block blood flow to the tissue (hemorrhoid stapling).  HOME CARE INSTRUCTIONS   Eat foods with fiber, such as whole grains, beans,  nuts, fruits, and vegetables. Ask your doctor about taking products with added fiber in them (fibersupplements).  Increase fluid intake. Drink enough water and fluids to keep your urine clear or pale yellow.   Exercise regularly.   Go to the bathroom when you have the urge to have a bowel movement. Do not wait.   Avoid straining to have bowel movements.   Keep the anal area dry and clean. Use wet toilet paper or moist towelettes after a bowel movement.   Medicated creams and suppositories may be used or applied as directed.   Only take over-the-counter or prescription medicines as directed by your caregiver.   Take warm sitz baths for 15 20 minutes, 3 4 times a day to ease pain and discomfort.   Place ice packs on the hemorrhoids if they are tender and swollen. Using ice packs between sitz baths may be helpful.   Put ice in a plastic bag.   Place a towel between your skin and the bag.   Leave the ice on for 15 20 minutes, 3 4 times a day.   Do not use a donut-shaped Santelli or sit on the toilet for long periods. This increases blood pooling and pain.  SEEK MEDICAL CARE IF:  You have increasing pain and swelling that is not controlled by treatment or medicine.  You have uncontrolled bleeding.  You have difficulty or you are unable to have a bowel movement.  You have pain or inflammation outside the area of the hemorrhoids. MAKE SURE YOU:    Understand these instructions.  Will watch your condition.  Will get help right away if you are not doing well or get worse. Document Released: 11/27/2000 Document Revised: 11/16/2012 Document Reviewed: 10/04/2012 [employer] Patient Information 2014 [employer].  

## 2014-03-21 NOTE — Progress Notes (Signed)
HPI 53 y.o. female  presents for 3 month follow up with hypertension, hyperlipidemia, prediabetes and vitamin D. Her blood pressure has been controlled at home, today their BP is BP: 120/78 mmHg She does not workout but she does garden and watch her two year grand baby that she chases after, but with the nice weather her and her husband want to start walking. She denies chest pain, shortness of breath, dizziness.  She is not on cholesterol medication and denies myalgias. Her cholesterol is at goal. The cholesterol last visit was:   Lab Results  Component Value Date   CHOL 168 11/27/2013   HDL 67 11/27/2013   LDLCALC 87 11/27/2013   TRIG 71 11/27/2013   CHOLHDL 2.5 11/27/2013   Last A1C in the office was:  Lab Results  Component Value Date   HGBA1C 5.3 11/27/2013   Patient is on Vitamin D supplement.   She is down to one Gabapentin daily.   Current Medications:  Current Outpatient Prescriptions on File Prior to Visit  Medication Sig Dispense Refill  . amphetamine-dextroamphetamine (ADDERALL XR) 20 MG 24 hr capsule Take 1 capsule (20 mg total) by mouth every morning.  30 capsule  0  . Arginine 500 MG CAPS Take 500 mg by mouth daily.      Marland Kitchen aspirin 81 MG tablet Take 81 mg by mouth daily.      Marland Kitchen b complex vitamins tablet Take 1 tablet by mouth daily.      . Biotin (BIOTIN 5000) 5 MG CAPS Take 5,000 mg by mouth daily.      . fish oil-omega-3 fatty acids 1000 MG capsule Take 1,200 mg by mouth daily.      Marland Kitchen gabapentin (NEURONTIN) 600 MG tablet Take 1 tablet (600 mg total) by mouth 3 (three) times daily. for shoulder pain  90 tablet  99  . Ginkgo Biloba 40 MG TABS Take 1 tablet by mouth daily.      Marland Kitchen glucosamine-chondroitin 500-400 MG tablet Take 1 tablet by mouth daily.      . Lecithin 400 MG CAPS Take 400 mg by mouth daily.      . LevOCARNitine (L-CARNITINE) 500 MG TABS Take 500 mg by mouth daily.      . milk thistle 175 MG tablet Take 175 mg by mouth daily.      . Multiple Vitamin  (MULTIVITAMIN) tablet Take 1 tablet by mouth daily.      . naproxen (NAPROSYN) 500 MG tablet Take 500 mg by mouth as needed.      . polycarbophil (FIBERCON) 625 MG tablet Take 625 mg by mouth daily.      . Vitamin D, Ergocalciferol, (DRISDOL) 50000 UNITS CAPS capsule Take 1 capsule (50,000 Units total) by mouth every 7 (seven) days.  30 capsule  5   No current facility-administered medications on file prior to visit.   Medical History:  Past Medical History  Diagnosis Date  . HTN (hypertension)   . Hypoglycemia   . Goiter   . Murmur   . Dyspnea   . Cholecystitis, acute with cholelithiasis 07/19/2013  . Seizure disorder, primary generalized 07/19/2013    May be related to hypoglycemia  . Hypoglycemia 07/19/2013  . ADD (attention deficit disorder) 07/19/2013  . GERD (gastroesophageal reflux disease) 07/19/2013  . Chronic left shoulder pain 07/19/2013  . Arthritis   . Seizures   . Pre-diabetes    Allergies: No Known Allergies   Review of Systems: [X]  = complains of  [ ]  =  denies  General: Fatigue [ ]  Fever [ ]  Chills [ ]  Weakness [ ]   Insomnia [ ]  Eyes: Redness [ ]  Blurred vision [ ]  Diplopia [ ]   ENT: Congestion [ ]  Sinus Pain [ ]  Post Nasal Drip [ ]  Sore Throat [ ]  Earache [ ]   Cardiac: Chest pain/pressure [ ]  SOB [ ]  Orthopnea [ ]   Palpitations [ ]   Paroxysmal nocturnal dyspnea[ ]  Claudication [ ]  Edema [ ]   Pulmonary: Cough [ ]  Wheezing[ ]   SOB [ ]   Snoring [ ]   GI: Nausea [ ]  Vomiting[ ]  Dysphagia[ ]  Heartburn[ ]  Abdominal pain [ ]  Constipation [ ] ; Diarrhea [ ] ; BRBPR Valu.Nieves ] Melena[ ]  GU: Hematuria[ ]  Dysuria [ ]  Nocturia[ ]  Urgency [ ]   Hesitancy [ ]  Discharge [ ]  Neuro: Headaches[ ]  Vertigo[ ]  Paresthesias[ ]  Spasm [ ]  Speech changes [ ]  Incoordination [ ]   Ortho: Arthritis [ ]  Joint pain [ ]  Muscle pain [ ]  Joint swelling [ ]  Back Pain [ ]  Skin:  Rash [ ]   Pruritis [ ]  Change in skin lesion [ ]   Psych: Depression[ ]  Anxiety[ ]  Confusion [ ]  Memory loss [ ]   Heme/Lypmh: Bleeding [ ]   Bruising [ ]  Enlarged lymph nodes [ ]   Endocrine: Visual blurring [ ]  Paresthesia [ ]  Polyuria [ ]  Polydypsea [ ]    Heat/cold intolerance [ ]  Hypoglycemia [ ]   Family history- Review and unchanged Social history- Review and unchanged Physical Exam: BP 120/78  Pulse 80  Temp(Src) 98.4 F (36.9 C)  Resp 16  Ht 5' 3"  (1.6 m)  Wt 157 lb (71.215 kg)  BMI 27.82 kg/m2 Wt Readings from Last 3 Encounters:  03/21/14 157 lb (71.215 kg)  11/27/13 153 lb 12.8 oz (69.763 kg)  08/22/13 153 lb 12.8 oz (69.763 kg)   General Appearance: Well nourished, in no apparent distress. Eyes: PERRLA, EOMs, conjunctiva no swelling or erythema Sinuses: No Frontal/maxillary tenderness ENT/Mouth: Ext aud canals clear, TMs without erythema, bulging. No erythema, swelling, or exudate on post pharynx.  Tonsils not swollen or erythematous. Hearing normal.  Neck: Supple, thyroid normal.  Respiratory: Respiratory effort normal, BS equal bilaterally without rales, rhonchi, wheezing or stridor.  Cardio: RRR with no MRGs. Brisk peripheral pulses without edema.  Abdomen: Soft, + BS.  Non tender, no guarding, rebound, hernias, masses. Lymphatics: Non tender without lymphadenopathy.  Musculoskeletal: Full ROM, 5/5 strength, normal gait.  Skin: Warm, dry without rashes, lesions, ecchymosis.  Neuro: Cranial nerves intact. Normal muscle tone, no cerebellar symptoms. Sensation intact.  Psych: Awake and oriented X 3, normal affect, Insight and Judgment appropriate.   Assessment and Plan:  Hypertension: Continue medication, monitor blood pressure at home. Continue DASH diet. Cholesterol: Continue diet and exercise. Check cholesterol.  Vitamin D Def- check level and continue medications.  Hemorrhoids- 1 episode of bright red blood with BM- normal colon 2012- given information- if worse follow up here or GI  Continue diet and meds as discussed. Further disposition pending results of labs.  Vicie Mutters 2:54 PM

## 2014-03-22 LAB — LIPID PANEL
CHOLESTEROL: 152 mg/dL (ref 0–200)
HDL: 61 mg/dL (ref >39)
LDL Cholesterol: 78 mg/dL (ref 0–99)
Total CHOL/HDL Ratio: 2.5 Ratio
Triglycerides: 67 mg/dL (ref <150)
VLDL: 13 mg/dL (ref 0–40)

## 2014-03-22 LAB — HEPATIC FUNCTION PANEL
ALBUMIN: 4.1 g/dL (ref 3.5–5.2)
ALT: 28 U/L (ref 0–35)
AST: 26 U/L (ref 0–37)
Alkaline Phosphatase: 81 U/L (ref 39–117)
Bilirubin, Direct: 0.1 mg/dL (ref 0.0–0.3)
Indirect Bilirubin: 0.3 mg/dL (ref 0.2–1.2)
TOTAL PROTEIN: 6.5 g/dL (ref 6.0–8.3)
Total Bilirubin: 0.4 mg/dL (ref 0.2–1.2)

## 2014-03-22 LAB — BASIC METABOLIC PANEL WITH GFR
BUN: 13 mg/dL (ref 6–23)
CALCIUM: 9.3 mg/dL (ref 8.4–10.5)
CO2: 22 meq/L (ref 19–32)
CREATININE: 0.54 mg/dL (ref 0.50–1.10)
Chloride: 103 mEq/L (ref 96–112)
GFR, Est African American: >89 mL/min
GFR, Est Non African American: >89 mL/min
GLUCOSE: 65 mg/dL — AB (ref 70–99)
Potassium: 4.5 mEq/L (ref 3.5–5.3)
Sodium: 138 mEq/L (ref 135–145)

## 2014-03-22 LAB — MAGNESIUM: MAGNESIUM: 2.2 mg/dL (ref 1.5–2.5)

## 2014-03-22 LAB — TSH: TSH: 1.534 u[IU]/mL (ref 0.350–4.500)

## 2014-03-22 LAB — VITAMIN D 25 HYDROXY (VIT D DEFICIENCY, FRACTURES): VIT D 25 HYDROXY: 69 ng/mL (ref 30–89)

## 2014-04-09 ENCOUNTER — Other Ambulatory Visit: Payer: Self-pay | Admitting: Emergency Medicine

## 2014-04-09 MED ORDER — AMPHETAMINE-DEXTROAMPHET ER 20 MG PO CP24: 20.0000 mg | ORAL_CAPSULE | ORAL | Status: DC

## 2014-05-15 ENCOUNTER — Other Ambulatory Visit: Payer: Self-pay | Admitting: Emergency Medicine

## 2014-05-15 MED ORDER — AMPHETAMINE-DEXTROAMPHET ER 20 MG PO CP24: 20.0000 mg | ORAL_CAPSULE | ORAL | Status: DC

## 2014-05-29 ENCOUNTER — Ambulatory Visit: Payer: Self-pay | Admitting: Internal Medicine

## 2014-06-05 ENCOUNTER — Telehealth: Payer: Self-pay

## 2014-06-05 ENCOUNTER — Other Ambulatory Visit: Payer: Self-pay | Admitting: Emergency Medicine

## 2014-06-05 NOTE — Telephone Encounter (Signed)
Pt requesting refill. Adderall 22m XR. 1 cap daily. Please call when ready.

## 2014-06-18 ENCOUNTER — Other Ambulatory Visit: Payer: Self-pay | Admitting: Internal Medicine

## 2014-06-18 DIAGNOSIS — F988 Other specified behavioral and emotional disorders with onset usually occurring in childhood and adolescence: Secondary | ICD-10-CM

## 2014-06-18 MED ORDER — AMPHETAMINE-DEXTROAMPHET ER 20 MG PO CP24: 20.0000 mg | ORAL_CAPSULE | ORAL | Status: DC

## 2014-07-17 ENCOUNTER — Other Ambulatory Visit: Payer: Self-pay | Admitting: Internal Medicine

## 2014-07-17 DIAGNOSIS — F988 Other specified behavioral and emotional disorders with onset usually occurring in childhood and adolescence: Secondary | ICD-10-CM

## 2014-07-17 MED ORDER — AMPHETAMINE-DEXTROAMPHET ER 20 MG PO CP24: 20.0000 mg | ORAL_CAPSULE | ORAL | Status: DC

## 2014-07-28 IMAGING — US US ABDOMEN COMPLETE
1 series · 14 of 25 positions shown · non-contrast
Comparison: Ultrasound of the abdomen of 04/29/2011

CLINICAL DATA: Right upper quadrant abdominal pain

COMPLETE ABDOMINAL ULTRASOUND

[Series 1: us abdomen complete · 0.28mm/px · 14 of 73 slices shown]
[im 1/73]
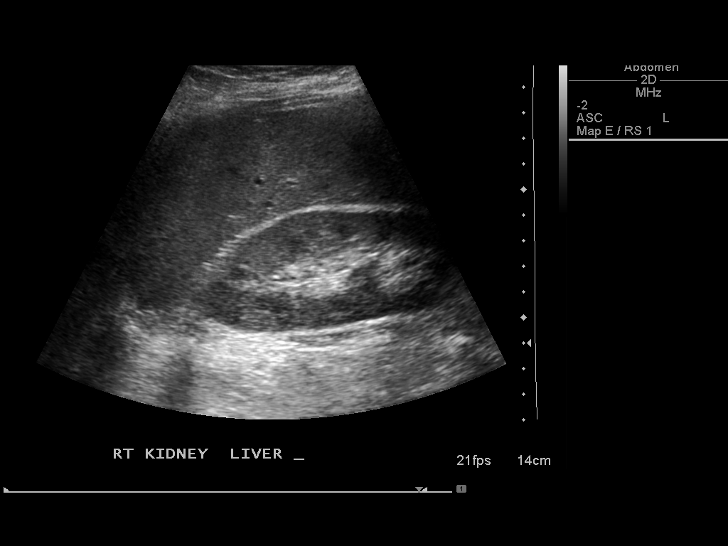
[im 7/73]
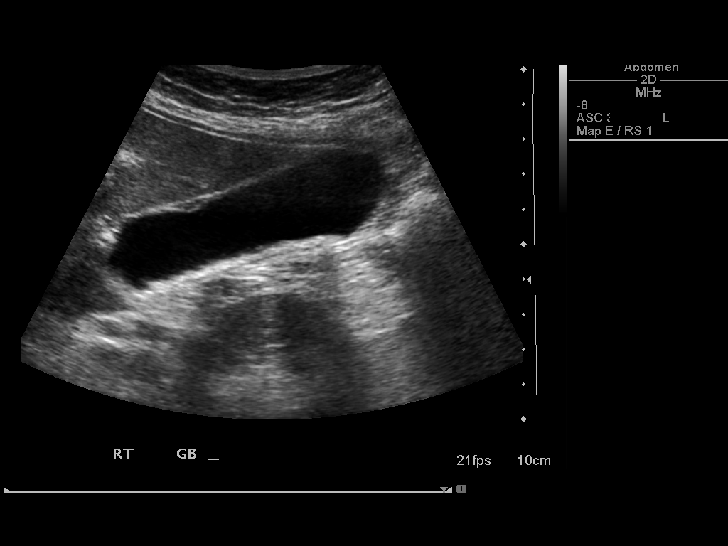
[im 13/73]
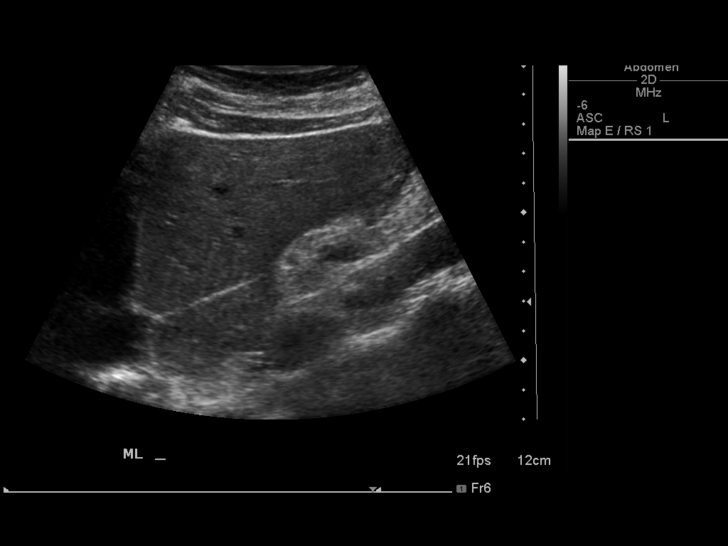
[im 19/73]
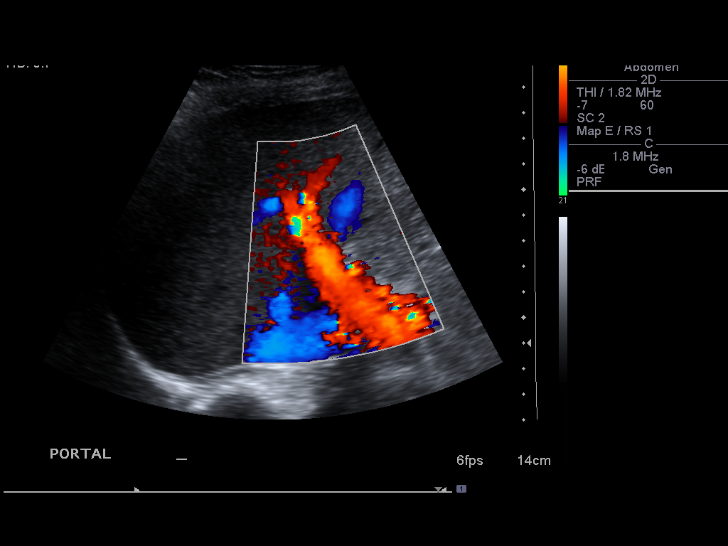
[im 25/73]
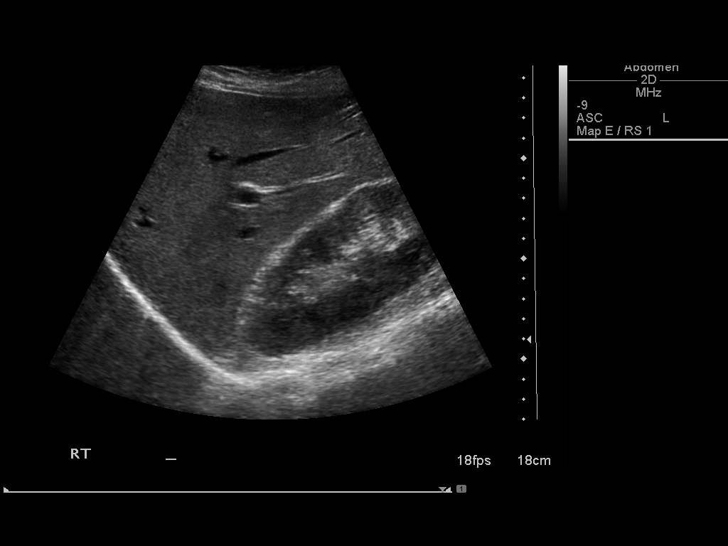
[im 28/73]
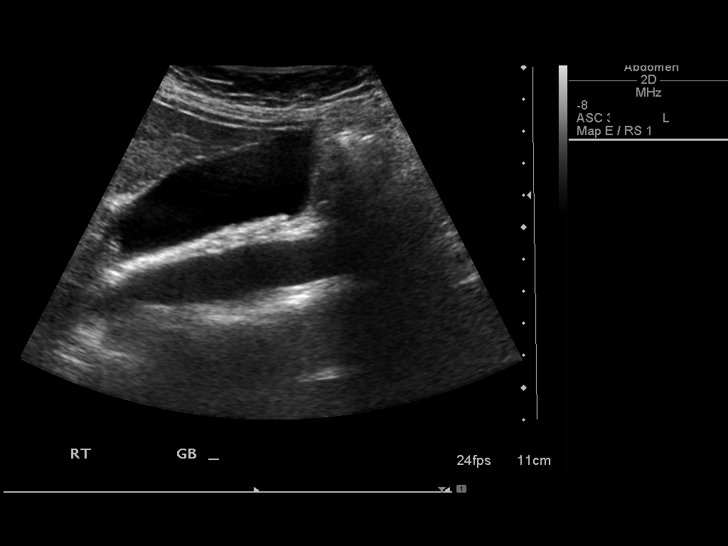
[im 34/73]
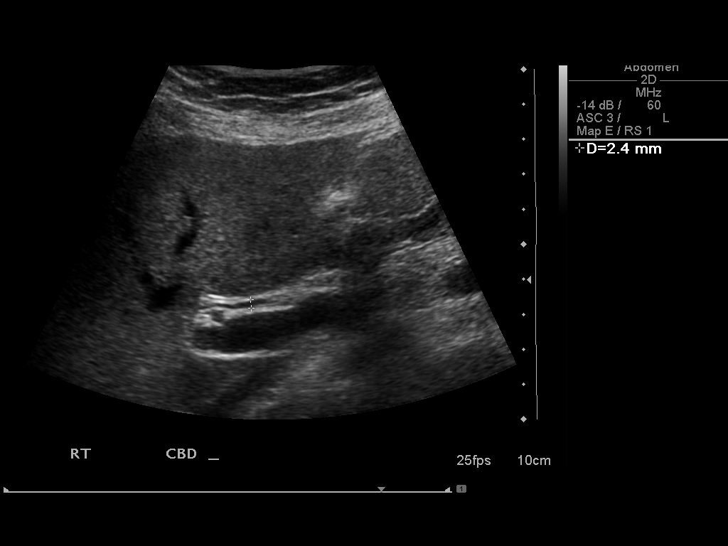
[im 40/73]
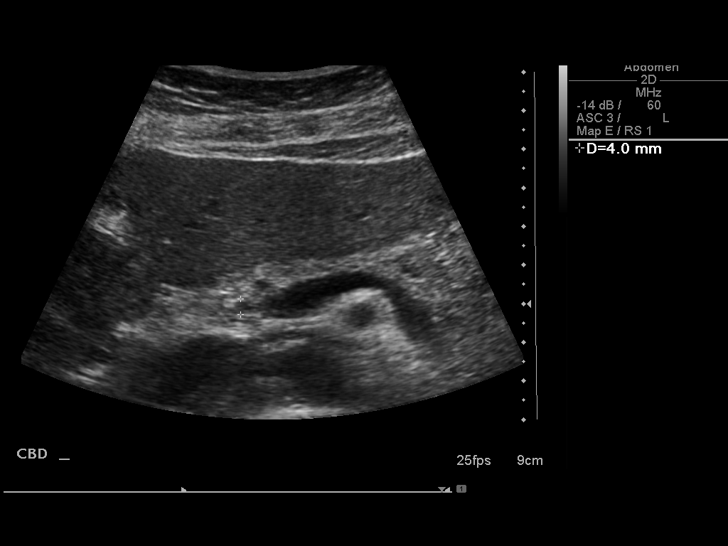
[im 46/73]
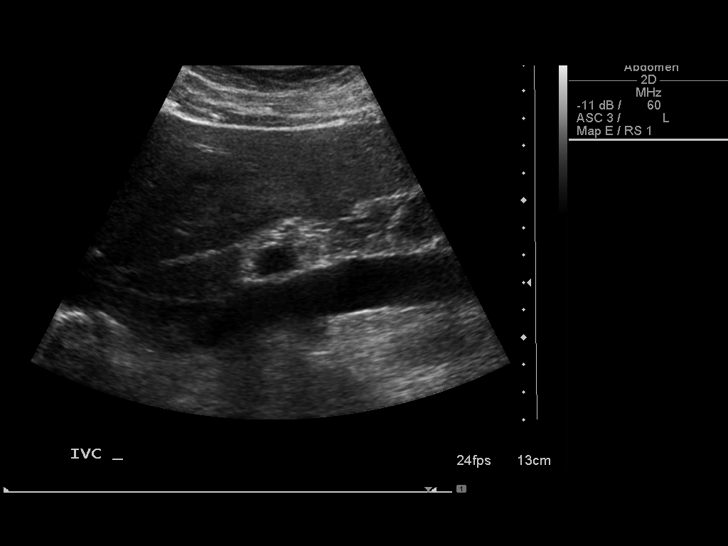
[im 49/73]
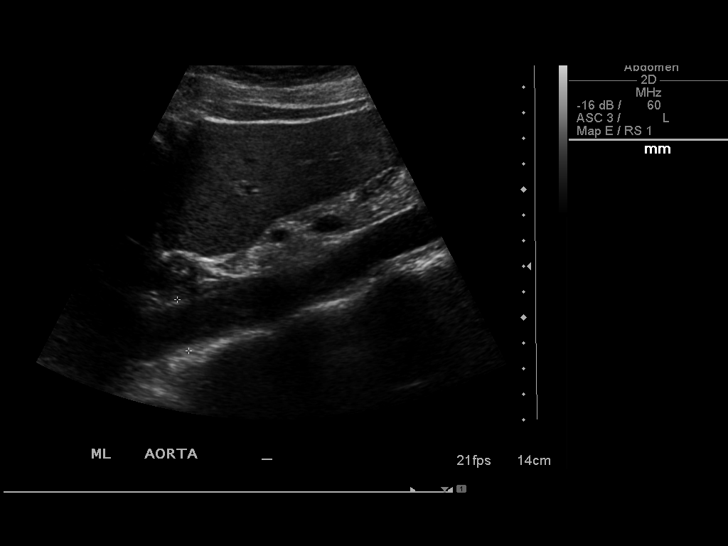
[im 55/73]
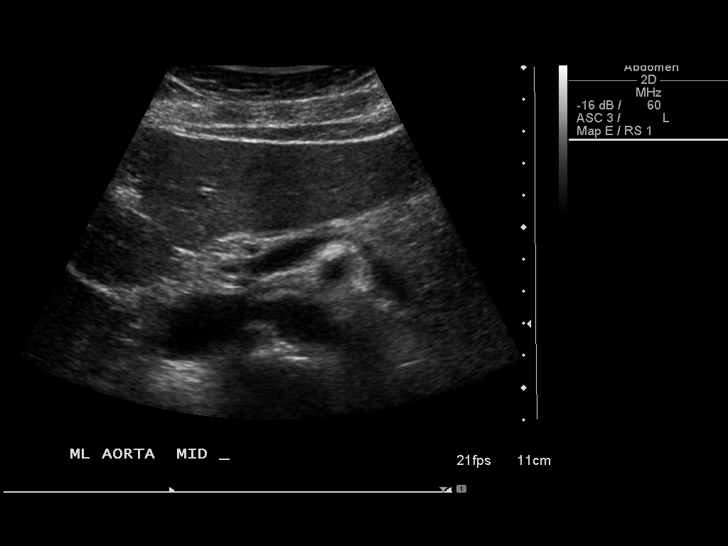
[im 61/73]
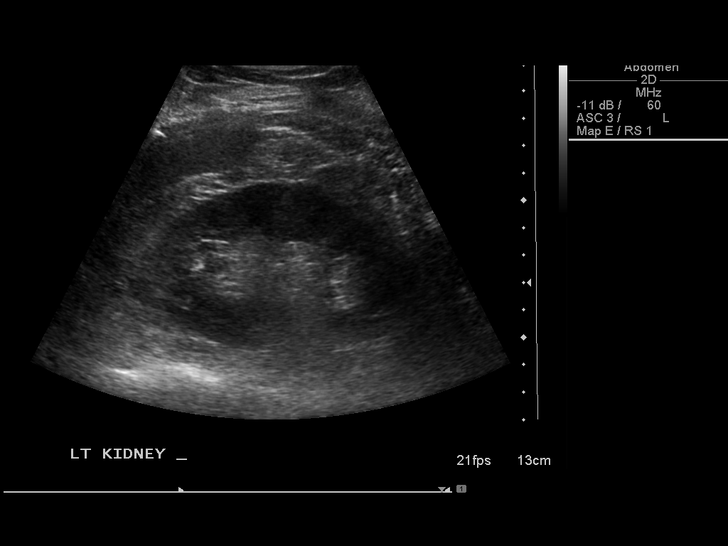
[im 67/73]
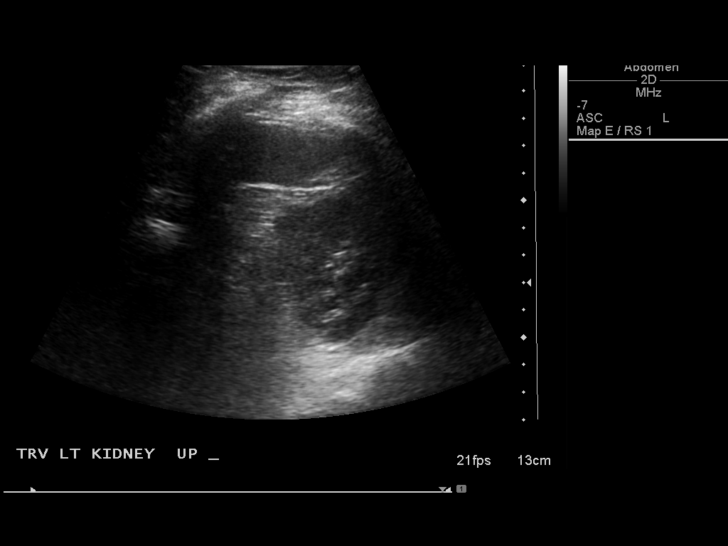
[im 73/73]
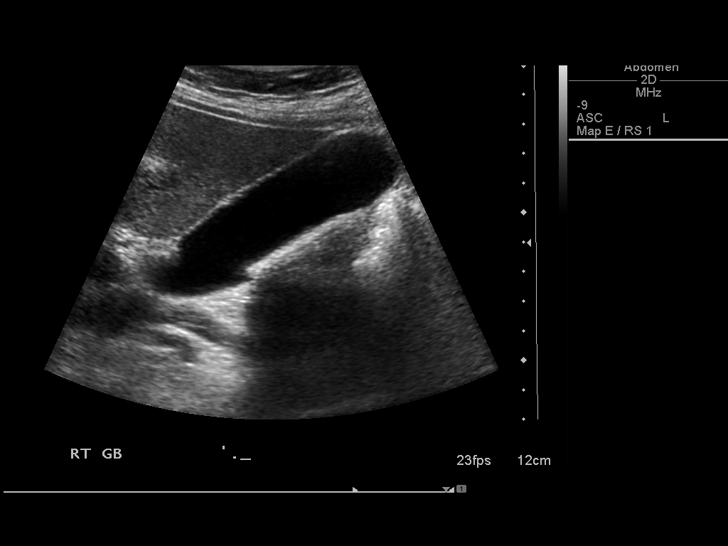

[14 of 25 positions shown; findings below may reference images not displayed]

FINDINGS: Gallbladder:  The gallbladder is visualized and multiple small
gallstones layer in the gallbladder with acoustical shadowing, none
measuring larger than 4 mm in diameter.  No pain is present over
the gallbladder with compression.

Common bile duct:  The common bile duct is normal measuring 5.1 mm
in diameter.

Liver:  The liver is minimally echogenic and mild fatty
infiltration cannot be excluded.  No focal abnormality is seen.

IVC:  Appears normal.

Pancreas:  No focal abnormality seen.

Spleen:  The spleen is normal measuring 5.5 cm sagittally.

Right Kidney:  No hydronephrosis is seen.  The right kidney
measures 11.6 cm sagittally.

Left Kidney:  No hydronephrosis is noted.  The left kidney measures
10.9 years.

Abdominal aorta:  The abdominal aorta is normal in caliber.
IMPRESSION: 1.  Multiple small gallstones.  No pain over the gallbladder
currently.
2.  Questionable mild fatty infiltration of the liver.  No ductal
dilatation.

## 2014-07-29 IMAGING — RF DG CHOLANGIOGRAM OPERATIVE
1 series · 4 of 4 positions shown · non-contrast
Comparison: None.

CLINICAL DATA: History of cholelithiasis

INTRAOPERATIVE CHOLANGIOGRAM
TECHNIQUE: Multiple fluoroscopic spot radiographs were obtained
during intraoperative cholangiogram and are submitted for
interpretation post-operatively. 8 seconds of fluoroscopy was
utilized.

[Series 1: run · 4 of 50 frames shown]
[frame 8/50]
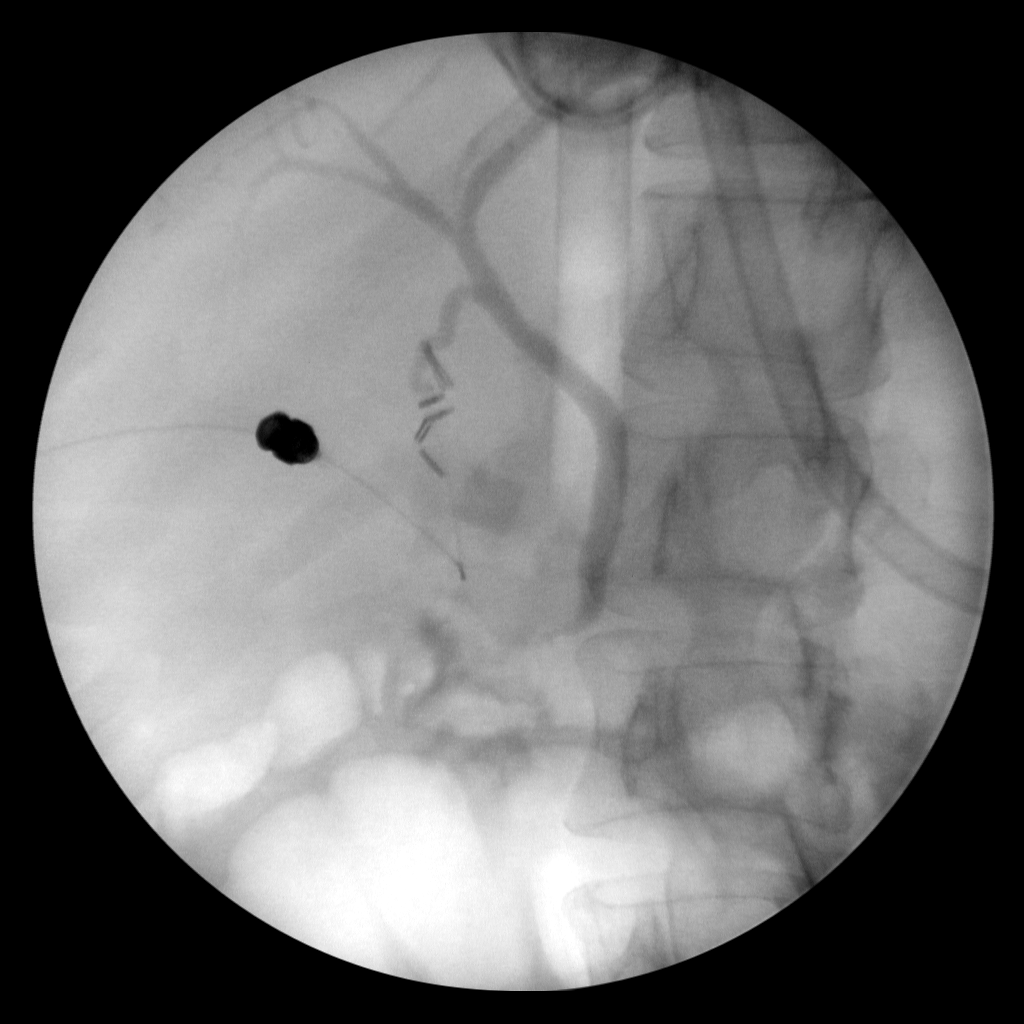
[frame 26/50]
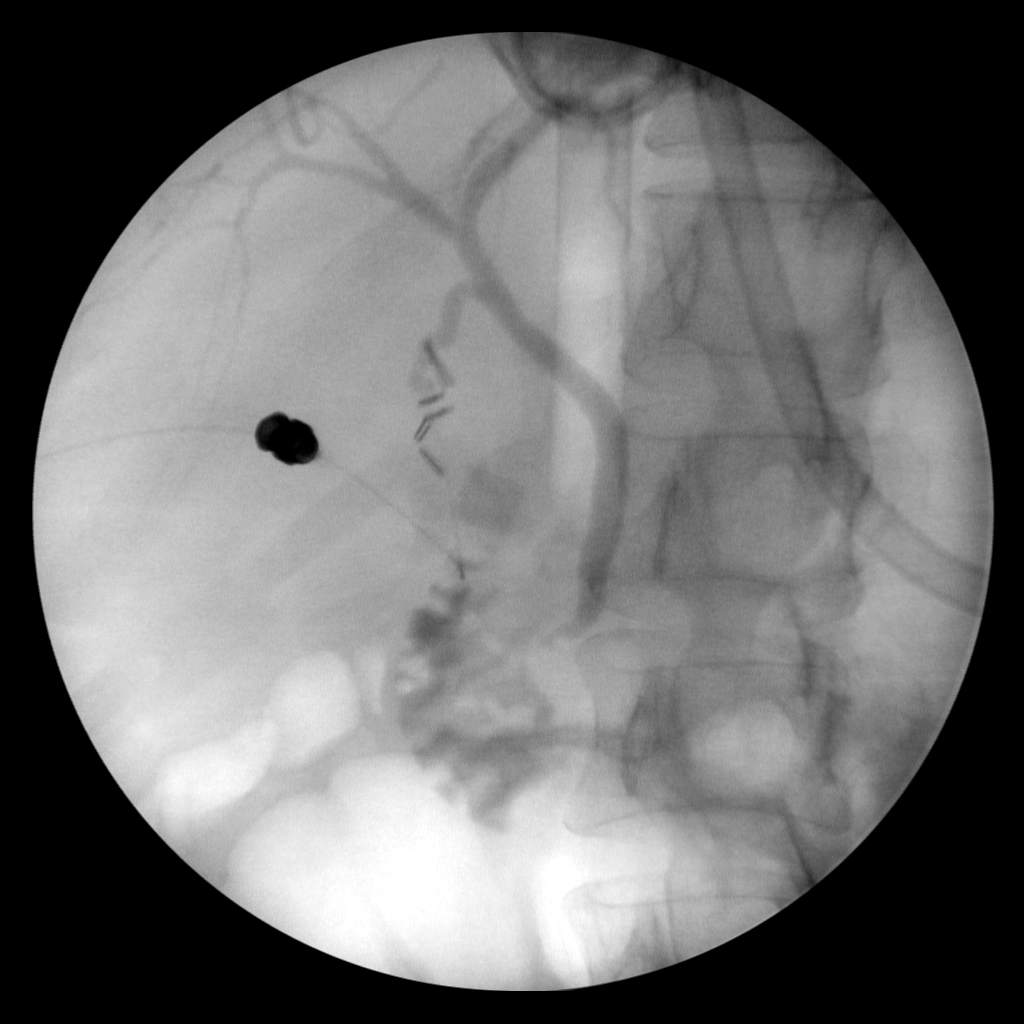
[frame 43/50]
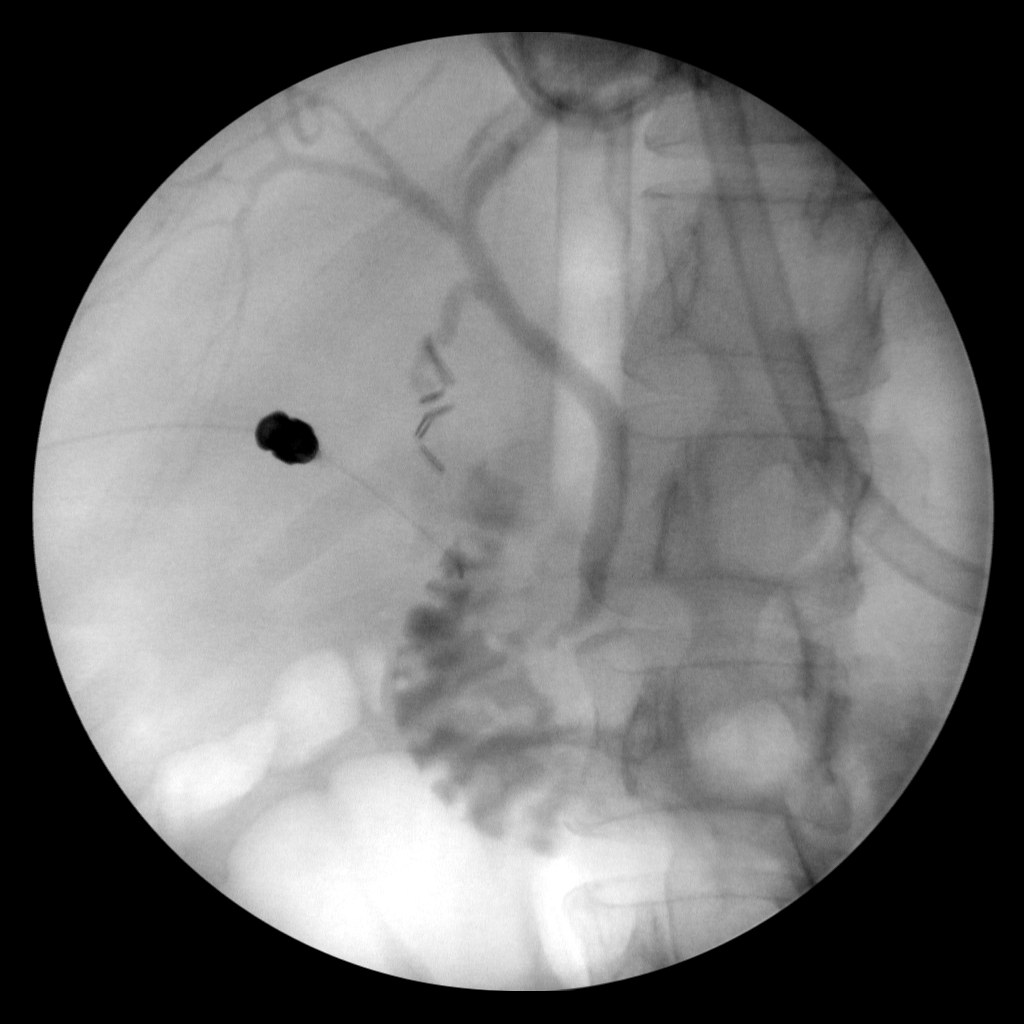
[frame 50/50]
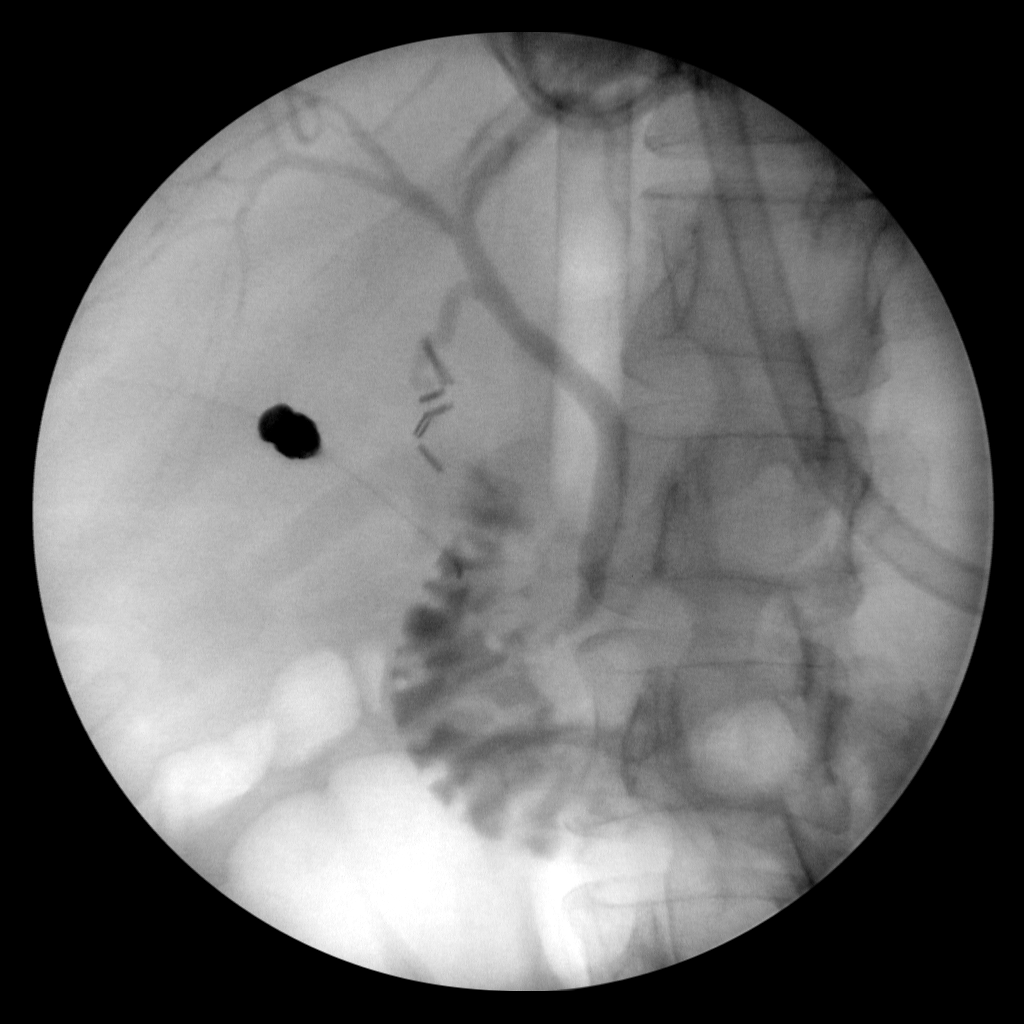

[4 of 4 positions shown; findings below may reference images not displayed]

FINDINGS: There is opacification of the intra and extrahepatic
biliary system.  Free flow of contrast material into the duodenum
is noted.  Mild reflux into the central pancreatic duct is seen.
No filling defects to suggest retained calculi are noted.

## 2014-08-13 ENCOUNTER — Ambulatory Visit
Admission: RE | Admit: 2014-08-13 | Discharge: 2014-08-13 | Disposition: A | Discharge status: Home / Self Care | Payer: BC Managed Care – PPO | Source: Ambulatory Visit | Attending: Orthopedic Surgery | Admitting: Orthopedic Surgery

## 2014-08-13 ENCOUNTER — Other Ambulatory Visit: Payer: Self-pay | Admitting: Orthopedic Surgery

## 2014-08-13 DIAGNOSIS — R52 Pain, unspecified: Secondary | ICD-10-CM

## 2014-08-16 ENCOUNTER — Other Ambulatory Visit: Payer: BC Managed Care – PPO

## 2014-08-30 ENCOUNTER — Other Ambulatory Visit: Payer: Self-pay | Admitting: Internal Medicine

## 2014-08-30 DIAGNOSIS — F988 Other specified behavioral and emotional disorders with onset usually occurring in childhood and adolescence: Secondary | ICD-10-CM

## 2014-08-30 MED ORDER — AMPHETAMINE-DEXTROAMPHET ER 20 MG PO CP24: 20.0000 mg | ORAL_CAPSULE | ORAL | Status: DC

## 2014-10-01 ENCOUNTER — Other Ambulatory Visit: Payer: Self-pay

## 2014-10-01 ENCOUNTER — Ambulatory Visit (INDEPENDENT_AMBULATORY_CARE_PROVIDER_SITE_OTHER): Payer: BC Managed Care – PPO | Admitting: *Deleted

## 2014-10-01 ENCOUNTER — Telehealth: Payer: Self-pay | Admitting: *Deleted

## 2014-10-01 ENCOUNTER — Other Ambulatory Visit: Payer: Self-pay | Admitting: Internal Medicine

## 2014-10-01 DIAGNOSIS — F988 Other specified behavioral and emotional disorders with onset usually occurring in childhood and adolescence: Secondary | ICD-10-CM

## 2014-10-01 DIAGNOSIS — Z1231 Encounter for screening mammogram for malignant neoplasm of breast: Secondary | ICD-10-CM

## 2014-10-01 DIAGNOSIS — R300 Dysuria: Secondary | ICD-10-CM

## 2014-10-01 MED ORDER — CIPROFLOXACIN HCL 250 MG PO TABS: 250.0000 mg | ORAL_TABLET | Freq: Two times a day (BID) | ORAL | Status: AC

## 2014-10-01 MED ORDER — AMPHETAMINE-DEXTROAMPHET ER 20 MG PO CP24: 20.0000 mg | ORAL_CAPSULE | ORAL | Status: DC

## 2014-10-01 NOTE — Telephone Encounter (Signed)
Left message to inform patient RX is ready for pick up.

## 2014-10-01 NOTE — Progress Notes (Signed)
Patient ID: Emily Graham, female   DOB: 1961/05/05, 53 y.o.   MRN: [id number] Patient presents to check UA,C&S with c/o dysuria, burning, foul odor with urination, and symptoms increasing in severity.  Per Dr. Idell Pickles orders UA, C&S will be checked and abx for Cipro sent into pharmacy for patient to start to help offer relief from symptoms.  Patient advised we may need to change abx once culture results.

## 2014-10-02 LAB — URINALYSIS, ROUTINE W REFLEX MICROSCOPIC
Bilirubin Urine: NEGATIVE
Glucose, UA: 250 mg/dL — AB
Hgb urine dipstick: NEGATIVE
KETONES UR: NEGATIVE mg/dL
Leukocytes, UA: NEGATIVE
Nitrite: NEGATIVE
PROTEIN: NEGATIVE mg/dL
Specific Gravity, Urine: >1.03 — ABNORMAL HIGH (ref 1.005–1.030)
UROBILINOGEN UA: 0.2 mg/dL (ref 0.0–1.0)
pH: 6 (ref 5.0–8.0)

## 2014-10-02 LAB — URINE CULTURE

## 2014-10-16 ENCOUNTER — Ambulatory Visit
Admission: RE | Admit: 2014-10-16 | Discharge: 2014-10-16 | Disposition: A | Discharge status: Home / Self Care | Payer: BC Managed Care – PPO | Source: Ambulatory Visit

## 2014-10-16 DIAGNOSIS — Z1231 Encounter for screening mammogram for malignant neoplasm of breast: Secondary | ICD-10-CM

## 2014-10-30 ENCOUNTER — Other Ambulatory Visit: Payer: Self-pay | Admitting: Internal Medicine

## 2014-10-30 DIAGNOSIS — F988 Other specified behavioral and emotional disorders with onset usually occurring in childhood and adolescence: Secondary | ICD-10-CM

## 2014-10-30 MED ORDER — AMPHETAMINE-DEXTROAMPHET ER 20 MG PO CP24: 20.0000 mg | ORAL_CAPSULE | ORAL | Status: DC

## 2014-10-30 MED ORDER — VITAMIN D (ERGOCALCIFEROL) 1.25 MG (50000 UNIT) PO CAPS: ORAL_CAPSULE | ORAL | Status: DC

## 2014-11-20 ENCOUNTER — Emergency Department (HOSPITAL_COMMUNITY)
Admission: EM | Admit: 2014-11-20 | Discharge: 2014-11-20 | Disposition: A | Discharge status: Home / Self Care | Payer: BC Managed Care – PPO | Attending: Emergency Medicine | Admitting: Emergency Medicine

## 2014-11-20 ENCOUNTER — Emergency Department (HOSPITAL_COMMUNITY): Payer: BC Managed Care – PPO

## 2014-11-20 ENCOUNTER — Encounter (HOSPITAL_COMMUNITY): Payer: Self-pay | Admitting: Emergency Medicine

## 2014-11-20 DIAGNOSIS — R011 Cardiac murmur, unspecified: Secondary | ICD-10-CM | POA: Diagnosis not present

## 2014-11-20 DIAGNOSIS — M1990 Unspecified osteoarthritis, unspecified site: Secondary | ICD-10-CM | POA: Diagnosis not present

## 2014-11-20 DIAGNOSIS — I10 Essential (primary) hypertension: Secondary | ICD-10-CM | POA: Insufficient documentation

## 2014-11-20 DIAGNOSIS — R1031 Right lower quadrant pain: Secondary | ICD-10-CM | POA: Diagnosis not present

## 2014-11-20 DIAGNOSIS — F909 Attention-deficit hyperactivity disorder, unspecified type: Secondary | ICD-10-CM | POA: Diagnosis not present

## 2014-11-20 DIAGNOSIS — G40909 Epilepsy, unspecified, not intractable, without status epilepticus: Secondary | ICD-10-CM | POA: Insufficient documentation

## 2014-11-20 DIAGNOSIS — G8929 Other chronic pain: Secondary | ICD-10-CM | POA: Diagnosis not present

## 2014-11-20 DIAGNOSIS — Z79899 Other long term (current) drug therapy: Secondary | ICD-10-CM | POA: Diagnosis not present

## 2014-11-20 DIAGNOSIS — Z7982 Long term (current) use of aspirin: Secondary | ICD-10-CM | POA: Insufficient documentation

## 2014-11-20 DIAGNOSIS — R1032 Left lower quadrant pain: Secondary | ICD-10-CM | POA: Insufficient documentation

## 2014-11-20 DIAGNOSIS — M545 Low back pain: Secondary | ICD-10-CM | POA: Insufficient documentation

## 2014-11-20 DIAGNOSIS — R195 Other fecal abnormalities: Secondary | ICD-10-CM | POA: Insufficient documentation

## 2014-11-20 DIAGNOSIS — Z9884 Bariatric surgery status: Secondary | ICD-10-CM | POA: Insufficient documentation

## 2014-11-20 DIAGNOSIS — R52 Pain, unspecified: Secondary | ICD-10-CM

## 2014-11-20 DIAGNOSIS — Z8639 Personal history of other endocrine, nutritional and metabolic disease: Secondary | ICD-10-CM | POA: Insufficient documentation

## 2014-11-20 DIAGNOSIS — Z8719 Personal history of other diseases of the digestive system: Secondary | ICD-10-CM | POA: Diagnosis not present

## 2014-11-20 DIAGNOSIS — Z9089 Acquired absence of other organs: Secondary | ICD-10-CM | POA: Insufficient documentation

## 2014-11-20 DIAGNOSIS — R110 Nausea: Secondary | ICD-10-CM | POA: Insufficient documentation

## 2014-11-20 LAB — CBC WITH DIFFERENTIAL/PLATELET
BASOS ABS: 0 10*3/uL (ref 0.0–0.1)
Basophils Relative: 0 % (ref 0–1)
Eosinophils Absolute: 0 10*3/uL (ref 0.0–0.7)
Eosinophils Relative: 0 % (ref 0–5)
HEMATOCRIT: 40 % (ref 36.0–46.0)
Hemoglobin: 13.7 g/dL (ref 12.0–15.0)
LYMPHS ABS: 0.7 10*3/uL (ref 0.7–4.0)
LYMPHS PCT: 7 % — AB (ref 12–46)
MCH: 31.3 pg (ref 26.0–34.0)
MCHC: 34.3 g/dL (ref 30.0–36.0)
MCV: 91.3 fL (ref 78.0–100.0)
MONO ABS: 1.1 10*3/uL — AB (ref 0.1–1.0)
Monocytes Relative: 10 % (ref 3–12)
Neutro Abs: 9.5 10*3/uL — ABNORMAL HIGH (ref 1.7–7.7)
Neutrophils Relative %: 83 % — ABNORMAL HIGH (ref 43–77)
Platelets: 158 10*3/uL (ref 150–400)
RBC: 4.38 MIL/uL (ref 3.87–5.11)
RDW: 12.8 % (ref 11.5–15.5)
WBC: 11.4 10*3/uL — AB (ref 4.0–10.5)

## 2014-11-20 LAB — I-STAT CHEM 8, ED
BUN: 6 mg/dL (ref 6–23)
CHLORIDE: 96 meq/L (ref 96–112)
CREATININE: 0.5 mg/dL (ref 0.50–1.10)
Calcium, Ion: 1.09 mmol/L — ABNORMAL LOW (ref 1.12–1.23)
Glucose, Bld: 111 mg/dL — ABNORMAL HIGH (ref 70–99)
HCT: 43 % (ref 36.0–46.0)
Hemoglobin: 14.6 g/dL (ref 12.0–15.0)
Potassium: 3.7 mEq/L (ref 3.7–5.3)
Sodium: 131 mEq/L — ABNORMAL LOW (ref 137–147)
TCO2: 24 mmol/L (ref 0–100)

## 2014-11-20 LAB — URINALYSIS, ROUTINE W REFLEX MICROSCOPIC
Bilirubin Urine: NEGATIVE
Glucose, UA: 500 mg/dL — AB
Hgb urine dipstick: NEGATIVE
Ketones, ur: 15 mg/dL — AB
Nitrite: NEGATIVE
Protein, ur: NEGATIVE mg/dL
Specific Gravity, Urine: 1.012 (ref 1.005–1.030)
Urobilinogen, UA: 0.2 mg/dL (ref 0.0–1.0)
pH: 7.5 (ref 5.0–8.0)

## 2014-11-20 LAB — URINE MICROSCOPIC-ADD ON

## 2014-11-20 LAB — POC OCCULT BLOOD, ED: FECAL OCCULT BLD: POSITIVE — AB

## 2014-11-20 LAB — I-STAT CG4 LACTIC ACID, ED: LACTIC ACID, VENOUS: 0.82 mmol/L (ref 0.5–2.2)

## 2014-11-20 MED ORDER — OXYCODONE-ACETAMINOPHEN 5-325 MG PO TABS: 1.0000 | ORAL_TABLET | Freq: Four times a day (QID) | ORAL | Status: DC | PRN

## 2014-11-20 MED ORDER — ONDANSETRON HCL 4 MG/2ML IJ SOLN
4.0000 mg | Freq: Once | INTRAMUSCULAR | Status: AC
  Administered 2014-11-20: 4 mg via INTRAVENOUS
  Filled 2014-11-20: qty 2

## 2014-11-20 MED ORDER — ONDANSETRON HCL 4 MG PO TABS: 4.0000 mg | ORAL_TABLET | Freq: Four times a day (QID) | ORAL | Status: DC

## 2014-11-20 MED ORDER — IOHEXOL 300 MG/ML  SOLN
50.0000 mL | Freq: Once | INTRAMUSCULAR | Status: AC | PRN
  Administered 2014-11-20: 50 mL via ORAL

## 2014-11-20 MED ORDER — IOHEXOL 300 MG/ML  SOLN
100.0000 mL | Freq: Once | INTRAMUSCULAR | Status: AC | PRN
  Administered 2014-11-20: 100 mL via INTRAVENOUS

## 2014-11-20 MED ORDER — ONDANSETRON 4 MG PO TBDP: 4.0000 mg | ORAL_TABLET | Freq: Three times a day (TID) | ORAL | Status: DC | PRN

## 2014-11-20 MED ORDER — HYDROMORPHONE HCL 1 MG/ML IJ SOLN
1.0000 mg | Freq: Once | INTRAMUSCULAR | Status: AC
Start: 2014-11-20 — End: 2014-11-20
  Administered 2014-11-20: 1 mg via INTRAVENOUS
  Filled 2014-11-20: qty 1

## 2014-11-20 MED ORDER — ONDANSETRON HCL 4 MG PO TABS: 4.0000 mg | ORAL_TABLET | Freq: Three times a day (TID) | ORAL | Status: DC | PRN

## 2014-11-20 MED ORDER — SODIUM CHLORIDE 0.9 % IV SOLN
Freq: Once | INTRAVENOUS | Status: AC
  Administered 2014-11-20: 02:00:00 via INTRAVENOUS

## 2014-11-20 MED ORDER — HYDROMORPHONE HCL 1 MG/ML IJ SOLN
1.0000 mg | Freq: Once | INTRAMUSCULAR | Status: AC
  Administered 2014-11-20: 1 mg via INTRAVENOUS
  Filled 2014-11-20: qty 1

## 2014-11-20 NOTE — ED Notes (Signed)
Patient returned from  CT without distress noted.

## 2014-11-20 NOTE — ED Provider Notes (Signed)
[name]: [id number]     Arrival date & time 11/20/14  0048 History   First MD Initiated Contact with Patient 11/20/14 0058     Chief Complaint  Patient presents with  . Back Pain     (Consider location/radiation/quality/duration/timing/severity/associated sxs/prior Treatment) Patient is a 53 y.o. female presenting with back pain. The history is provided by the patient.  Back Pain Location:  Lumbar spine Quality:  Aching Radiates to:  R thigh and L thigh Pain severity:  Severe Pain is:  Same all the time Onset quality:  Gradual Duration:  2 days Timing:  Constant Progression:  Worsening Chronicity:  New Context: not physical stress and not recent illness   Relieved by:  Nothing Worsened by:  Nothing tried Ineffective treatments:  Narcotics Associated symptoms: abdominal pain   Associated symptoms: no bladder incontinence, no bowel incontinence, no dysuria, no fever, no numbness, no paresthesias, no pelvic pain, no perianal numbness, no tingling and no weakness   Associated symptoms comment:  Blood in stool Risk factors: no hx of cancer, no menopause, no recent surgery and no steroid use     Past Medical History  Diagnosis Date  . HTN (hypertension)   . Hypoglycemia   . Goiter   . Murmur   . Dyspnea   . Cholecystitis, acute with cholelithiasis 07/19/2013  . Seizure disorder, primary generalized 07/19/2013    May be related to hypoglycemia  . Hypoglycemia 07/19/2013  . ADD (attention deficit disorder) 07/19/2013  . GERD (gastroesophageal reflux disease) 07/19/2013  . Chronic left shoulder pain 07/19/2013  . Arthritis   . Seizures   . Pre-diabetes    Past Surgical History  Procedure Laterality Date  . Gastric bypass    . Abdominal hysterectomy    . Breast enhancement surgery    . Total shoulder replacement    . Stomach tuck    . Cholecystectomy N/A 07/20/2013    Procedure: LAPAROSCOPIC CHOLECYSTECTOMY WITH INTRAOPERATIVE CHOLANGIOGRAM;  Surgeon: Shann Medal, MD;  Location: [facility]  [facility];  Service: General;  Laterality: N/A;  . Gastric bypass     Family History  Problem Relation Age of Onset  . Coronary artery disease Father     MI at age 5  . Heart disease Father   . Hypertension Father   . Diabetes Father   . Cancer Mother     skin  . Hypertension Mother   . Hyperlipidemia Mother   . Cancer Maternal Aunt     breast  . Cancer Paternal Aunt     breast  . Cancer Paternal Uncle     breast  . Cancer Maternal Aunt   . Cancer Cousin    History  Substance Use Topics  . Smoking status: Never Smoker   . Smokeless tobacco: Never Used  . Alcohol Use: 0.5 oz/week    1 drink(s) per week     Comment: Occasional   OB History    Gravida Para Term Preterm AB TAB SAB Ectopic Multiple Living   3 2 2  1  1         Review of Systems  Constitutional: Negative for fever, chills and fatigue.  Respiratory: Negative for shortness of breath.   Gastrointestinal: Positive for nausea, abdominal pain and blood in stool. Negative for vomiting and bowel incontinence.  Genitourinary: Positive for hematuria. Negative for bladder incontinence, dysuria, flank pain, vaginal bleeding, vaginal discharge and pelvic pain.  Musculoskeletal: Positive for back pain.  Skin: Negative for wound.  Neurological: Negative for tingling,  weakness, numbness and paresthesias.      Allergies  Review of patient's allergies indicates no known allergies.  Home Medications   Prior to Admission medications   Medication Sig Start Date End Date Taking? Authorizing Provider  amphetamine-dextroamphetamine (ADDERALL XR) 20 MG 24 hr capsule Take 1 capsule (20 mg total) by mouth every morning. 10/30/14 11/29/14 Yes Unk Pinto, MD  Arginine 500 MG CAPS Take 500 mg by mouth daily.   Yes Historical Provider, MD  aspirin 81 MG tablet Take 81 mg by mouth daily.   Yes Historical Provider, MD  b complex vitamins tablet Take 1 tablet by mouth daily.   Yes Historical Provider, MD  Biotin (BIOTIN 5000) 5 MG  CAPS Take 5,000 mg by mouth daily.   Yes Historical Provider, MD  fish oil-omega-3 fatty acids 1000 MG capsule Take 1,200 mg by mouth daily.   Yes Historical Provider, MD  gabapentin (NEURONTIN) 600 MG tablet Take 1 tablet (600 mg total) by mouth 3 (three) times daily. for shoulder pain 11/27/13  Yes Unk Pinto, MD  Ginkgo Biloba 40 MG TABS Take 1 tablet by mouth daily.   Yes Historical Provider, MD  glucosamine-chondroitin 500-400 MG tablet Take 1 tablet by mouth daily.   Yes Historical Provider, MD  Lecithin 400 MG CAPS Take 400 mg by mouth daily.   Yes Historical Provider, MD  LevOCARNitine (L-CARNITINE) 500 MG TABS Take 500 mg by mouth daily.   Yes Historical Provider, MD  milk thistle 175 MG tablet Take 175 mg by mouth daily.   Yes Historical Provider, MD  Multiple Vitamin (MULTIVITAMIN) tablet Take 1 tablet by mouth daily.   Yes Historical Provider, MD  naphazoline-pheniramine (NAPHCON-A) 0.025-0.3 % ophthalmic solution 2 drops 4 (four) times daily as needed for irritation.   Yes Historical Provider, MD  naproxen (NAPROSYN) 500 MG tablet Take 500 mg by mouth as needed.   Yes Historical Provider, MD  polycarbophil (FIBERCON) 625 MG tablet Take 625 mg by mouth daily.   Yes Historical Provider, MD  traMADol (ULTRAM) 50 MG tablet Take 1 tablet by mouth 2 (two) times daily. 11/13/14  Yes Historical Provider, MD  Vitamin D, Ergocalciferol, (DRISDOL) 50000 UNITS CAPS capsule Take 1 capsule daily as directed for severe Vitamin D Deficiency Patient taking differently: Take 50,000 Units by mouth daily. Take 1 capsule daily as directed for severe Vitamin D Deficiency 10/30/14 10/31/15 Yes Unk Pinto, MD  ondansetron (ZOFRAN) 4 MG tablet Take 1 tablet (4 mg total) by mouth every 6 (six) hours. 11/20/14   Garald Balding, NP  oxyCODONE-acetaminophen (PERCOCET/ROXICET) 5-325 MG per tablet Take 1 tablet by mouth every 6 (six) hours as needed for severe pain. 11/20/14   Garald Balding, NP   BP 130/75  mmHg  Pulse 89  Temp(Src) 100.7 F (38.2 C) (Rectal)  Resp 16  SpO2 100% Physical Exam  Constitutional: She is oriented to person, place, and time. She appears well-developed and well-nourished.  HENT:  Head: Normocephalic.  Eyes: Pupils are equal, round, and reactive to light.  Neck: Normal range of motion.  Cardiovascular: Normal rate and regular rhythm.   Pulmonary/Chest: Effort normal and breath sounds normal.  Abdominal: Soft. She exhibits no distension. There is tenderness.  Musculoskeletal: She exhibits tenderness.       Back:       Legs: Neurological: She is alert and oriented to person, place, and time.  Skin: Skin is warm and dry.    ED Course  Procedures (including critical care  time) Labs Review Labs Reviewed  CBC WITH DIFFERENTIAL - Abnormal; Notable for the following:    WBC 11.4 (*)    Neutrophils Relative % 83 (*)    Neutro Abs 9.5 (*)    Lymphocytes Relative 7 (*)    Monocytes Absolute 1.1 (*)    All other components within normal limits  URINALYSIS, ROUTINE W REFLEX MICROSCOPIC - Abnormal; Notable for the following:    Glucose, UA 500 (*)    Ketones, ur 15 (*)    Leukocytes, UA TRACE (*)    All other components within normal limits  I-STAT CHEM 8, ED - Abnormal; Notable for the following:    Sodium 131 (*)    Glucose, Bld 111 (*)    Calcium, Ion 1.09 (*)    All other components within normal limits  POC OCCULT BLOOD, ED - Abnormal; Notable for the following:    Fecal Occult Bld POSITIVE (*)    All other components within normal limits  CULTURE, BLOOD (ROUTINE X 2)  CULTURE, BLOOD (ROUTINE X 2)  URINE MICROSCOPIC-ADD ON  OCCULT BLOOD X 1 CARD TO LAB, STOOL  I-STAT CG4 LACTIC ACID, ED    Imaging Review Ct Abdomen Pelvis W Contrast  11/20/2014   CLINICAL DATA:  Lower abdominal pain. Bright red blood in stool in urine. White cells in urine. White cell count 11.4. Chronic back pain. History of multiple surgeries.  EXAM: CT ABDOMEN AND PELVIS WITH  CONTRAST  TECHNIQUE: Multidetector CT imaging of the abdomen and pelvis was performed using the standard protocol following bolus administration of intravenous contrast.  CONTRAST:  147m OMNIPAQUE IOHEXOL 300 MG/ML  SOLN  COMPARISON:  CT pelvis 09/22/2010  FINDINGS: Scarring in the lung bases. Surgical absence of the gallbladder. Postoperative gastric bypass. Diffuse fatty infiltration of the liver. Pancreas, spleen, adrenal glands, kidneys, abdominal aorta, inferior vena cava, and retroperitoneal lymph nodes are unremarkable. The stomach and small bowel are decompressed. Stool-filled colon. Transverse colon is decompressed but suggest wall thickening. This could be due to under distention or inflammation. Colitis not excluded. No free air or free fluid in the abdomen.  Pelvis: Appendix is normal. No free or loculated pelvic fluid collections. No pelvic mass or lymphadenopathy. Bladder wall is not thickened. No destructive bone lesions. Spondylolysis with mild spondylolisthesis at L4-5.  IMPRESSION: Transverse colonic wall thickening versus under distention. Colitis not excluded. Diffuse fatty infiltration of the liver. Appendix is normal.   Electronically Signed   By: WLucienne CapersM.D.   On: 11/20/2014 05:36     EKG Interpretation None      MDM  Stool caught converted to positive after 4 minutes Labs, urine, and CT scan reviewed, not revealing any pathology to explain patient's discomfort.  She is being referred back to her primary care physician.  She has seen a gastroenterologist and had a screening colonoscopy at age 53  She prefers to go back to this physician but at this time cannot remember his name for continued workup.  She'll be given a prescription for Percocet and Zofran with strict return parameters including fever, nausea, vomiting, increased pain.  That's not controlled with the home medication regime Final diagnoses:  Pain  Right lower quadrant abdominal pain  Abdominal pain, left  lower quadrant  Occult blood in stools         GGarald Balding NP 11/20/14 0600  AEverlene Balls MD 11/20/14 1544

## 2014-11-20 NOTE — ED Notes (Signed)
Awake. Verbally responsive. A/O x4. Resp even and unlabored. No audible adventitious breath sounds noted. ABC's intact. Lower abd pain and chronic back pain. MAEE. (+)PMS, CRT brisk. Pt denies injury/trauma.

## 2014-11-20 NOTE — ED Notes (Signed)
Awake. Verbally responsive. A/O x4. Resp even and unlabored. No audible adventitious breath sounds noted. ABC's intact.  

## 2014-11-20 NOTE — ED Notes (Signed)
Bed: AY04 Expected date:  Expected time:  Means of arrival:  Comments: EMS/53 yo chronic back pain

## 2014-11-20 NOTE — Discharge Instructions (Signed)
Abdominal Pain, Women Abdominal (stomach, pelvic, or belly) pain can be caused by many things. It is important to tell your doctor:  The location of the pain.  Does it come and go or is it present all the time?  Are there things that start the pain (eating certain foods, exercise)?  Are there other symptoms associated with the pain (fever, nausea, vomiting, diarrhea)? All of this is helpful to know when trying to find the cause of the pain. CAUSES   Stomach: virus or bacteria infection, or ulcer.  Intestine: appendicitis (inflamed appendix), regional ileitis (Crohn's disease), ulcerative colitis (inflamed colon), irritable bowel syndrome, diverticulitis (inflamed diverticulum of the colon), or cancer of the stomach or intestine.  Gallbladder disease or stones in the gallbladder.  Kidney disease, kidney stones, or infection.  Pancreas infection or cancer.  Fibromyalgia (pain disorder).  Diseases of the female organs:  Uterus: fibroid (non-cancerous) tumors or infection.  Fallopian tubes: infection or tubal pregnancy.  Ovary: cysts or tumors.  Pelvic adhesions (scar tissue).  Endometriosis (uterus lining tissue growing in the pelvis and on the pelvic organs).  Pelvic congestion syndrome (female organs filling up with blood just before the menstrual period).  Pain with the menstrual period.  Pain with ovulation (producing an egg).  Pain with an IUD (intrauterine device, birth control) in the uterus.  Cancer of the female organs.  Functional pain (pain not caused by a disease, may improve without treatment).  Psychological pain.  Depression. DIAGNOSIS  Your doctor will decide the seriousness of your pain by doing an examination.  Blood tests.  X-rays.  Ultrasound.  CT scan (computed tomography, special type of X-ray).  MRI (magnetic resonance imaging).  Cultures, for infection.  Barium enema (dye inserted in the large intestine, to better view it with  X-rays).  Colonoscopy (looking in intestine with a lighted tube).  Laparoscopy (minor surgery, looking in abdomen with a lighted tube).  Major abdominal exploratory surgery (looking in abdomen with a large incision). TREATMENT  The treatment will depend on the cause of the pain.   Many cases can be observed and treated at home.  Over-the-counter medicines recommended by your caregiver.  Prescription medicine.  Antibiotics, for infection.  Birth control pills, for painful periods or for ovulation pain.  Hormone treatment, for endometriosis.  Nerve blocking injections.  Physical therapy.  Antidepressants.  Counseling with a psychologist or psychiatrist.  Minor or major surgery. HOME CARE INSTRUCTIONS   Do not take laxatives, unless directed by your caregiver.  Take over-the-counter pain medicine only if ordered by your caregiver. Do not take aspirin because it can cause an upset stomach or bleeding.  Try a clear liquid diet (broth or water) as ordered by your caregiver. Slowly move to a bland diet, as tolerated, if the pain is related to the stomach or intestine.  Have a thermometer and take your temperature several times a day, and record it.  Bed rest and sleep, if it helps the pain.  Avoid sexual intercourse, if it causes pain.  Avoid stressful situations.  Keep your follow-up appointments and tests, as your caregiver orders.  If the pain does not go away with medicine or surgery, you may try:  Acupuncture.  Relaxation exercises (yoga, meditation).  Group therapy.  Counseling. SEEK MEDICAL CARE IF:   You notice certain foods cause stomach pain.  Your home care treatment is not helping your pain.  You need stronger pain medicine.  You want your IUD removed.  You feel faint or  lightheaded.  You develop nausea and vomiting.  You develop a rash.  You are having side effects or an allergy to your medicine. SEEK IMMEDIATE MEDICAL CARE IF:   Your  pain does not go away or gets worse.  You have a fever.  Your pain is felt only in portions of the abdomen. The right side could possibly be appendicitis. The left lower portion of the abdomen could be colitis or diverticulitis.  You are passing blood in your stools (bright red or black tarry stools, with or without vomiting).  You have blood in your urine.  You develop chills, with or without a fever.  You pass out. MAKE SURE YOU:   Understand these instructions.  Will watch your condition.  Will get help right away if you are not doing well or get worse. Document Released: 09/27/2007 Document Revised: 04/16/2014 Document Reviewed: 10/17/2009 [facility] Patient Information 2015 [place], [place]. This information is not intended to replace advice given to you by your health care provider. Make sure you discuss any questions you have with your health care provider. Please call your primary care physician today to make an appointment for further evaluation you prefer to use your relationship with GI for further evaluation in that department.  U been given prescriptions for Percocet and Zofran to help control nausea and pain.  If at anytime you develop fever, worsening symptoms.  Pain that cannot be controlled with his present medication regime.  We develop new or worsening symptoms please return to the emergency department for further evaluation

## 2014-11-20 NOTE — ED Notes (Signed)
Patient transported to CT 

## 2014-11-20 NOTE — ED Notes (Addendum)
Pt arrived via EMS on stretcher with c/o back pain radiating to anterior bil lower legs and taking Tramadol without improvement. Pt reported have chronic back pain and being followed via othropedic MD. Also have lower abd pain. Noted bright red blood in stool and urine. Denies urinary frequency/urgency or burning with voiding. Pt denies any injury/trauma to back. Pt was given Fentanyl 254mg IV via EMS.

## 2014-11-20 NOTE — ED Notes (Signed)
Awake. Verbally responsive. A/O x4. Resp even and unlabored. No audible adventitious breath sounds noted. ABC's intact. Pt given ginger ale to drink earlier. No N/V noted.

## 2014-11-20 NOTE — ED Notes (Deleted)
Awake. Verbally responsive. A/O x4. Resp even and unlabored. No audible adventitious breath sounds noted. ABC's intact. SR on monitor at

## 2014-11-20 NOTE — ED Notes (Signed)
Resting quietly with eye closed. Easily arousable. Verbally responsive. Resp even and unlabored. ABC's intact. NAD noted.

## 2014-11-20 NOTE — ED Notes (Signed)
Awake. Verbally responsive. A/O x4. Resp even and unlabored. No audible adventitious breath sounds noted. ABC's intact. Spouse at bedside.

## 2014-11-26 LAB — CULTURE, BLOOD (ROUTINE X 2)
Culture: NO GROWTH
Culture: NO GROWTH

## 2014-11-27 ENCOUNTER — Ambulatory Visit (INDEPENDENT_AMBULATORY_CARE_PROVIDER_SITE_OTHER): Payer: BC Managed Care – PPO | Admitting: Internal Medicine

## 2014-11-27 ENCOUNTER — Encounter: Payer: Self-pay | Admitting: Internal Medicine

## 2014-11-27 VITALS — BP 140/82 | HR 88 | Temp 98.1°F | Resp 16 | Ht 61.0 in | Wt 160.4 lb

## 2014-11-27 DIAGNOSIS — R7303 Prediabetes: Secondary | ICD-10-CM

## 2014-11-27 DIAGNOSIS — N390 Urinary tract infection, site not specified: Secondary | ICD-10-CM

## 2014-11-27 DIAGNOSIS — K625 Hemorrhage of anus and rectum: Secondary | ICD-10-CM

## 2014-11-27 DIAGNOSIS — Z79899 Other long term (current) drug therapy: Secondary | ICD-10-CM

## 2014-11-27 DIAGNOSIS — R7309 Other abnormal glucose: Secondary | ICD-10-CM

## 2014-11-27 DIAGNOSIS — E559 Vitamin D deficiency, unspecified: Secondary | ICD-10-CM

## 2014-11-27 DIAGNOSIS — Z1212 Encounter for screening for malignant neoplasm of rectum: Secondary | ICD-10-CM

## 2014-11-27 DIAGNOSIS — E785 Hyperlipidemia, unspecified: Secondary | ICD-10-CM

## 2014-11-27 DIAGNOSIS — I10 Essential (primary) hypertension: Secondary | ICD-10-CM

## 2014-11-27 DIAGNOSIS — R531 Weakness: Secondary | ICD-10-CM

## 2014-11-27 LAB — LIPID PANEL
CHOL/HDL RATIO: 4.2 ratio
CHOLESTEROL: 162 mg/dL (ref 0–200)
HDL: 39 mg/dL — ABNORMAL LOW (ref >39)
LDL Cholesterol: 106 mg/dL — ABNORMAL HIGH (ref 0–99)
Triglycerides: 83 mg/dL (ref <150)
VLDL: 17 mg/dL (ref 0–40)

## 2014-11-27 LAB — HEPATIC FUNCTION PANEL
ALBUMIN: 3.7 g/dL (ref 3.5–5.2)
ALT: 176 U/L — ABNORMAL HIGH (ref 0–35)
AST: 141 U/L — ABNORMAL HIGH (ref 0–37)
Alkaline Phosphatase: 86 U/L (ref 39–117)
BILIRUBIN DIRECT: 0.2 mg/dL (ref 0.0–0.3)
BILIRUBIN TOTAL: 0.5 mg/dL (ref 0.2–1.2)
Indirect Bilirubin: 0.3 mg/dL (ref 0.2–1.2)
Total Protein: 6.2 g/dL (ref 6.0–8.3)

## 2014-11-27 LAB — CBC WITH DIFFERENTIAL/PLATELET
BASOS PCT: 0 % (ref 0–1)
Basophils Absolute: 0 10*3/uL (ref 0.0–0.1)
EOS ABS: 0.2 10*3/uL (ref 0.0–0.7)
Eosinophils Relative: 2 % (ref 0–5)
HCT: 43.4 % (ref 36.0–46.0)
HEMOGLOBIN: 14.5 g/dL (ref 12.0–15.0)
Lymphocytes Relative: 20 % (ref 12–46)
Lymphs Abs: 1.6 10*3/uL (ref 0.7–4.0)
MCH: 30.8 pg (ref 26.0–34.0)
MCHC: 33.4 g/dL (ref 30.0–36.0)
MCV: 92.1 fL (ref 78.0–100.0)
MPV: 10.2 fL (ref 9.4–12.4)
Monocytes Absolute: 0.9 10*3/uL (ref 0.1–1.0)
Monocytes Relative: 11 % (ref 3–12)
Neutro Abs: 5.2 10*3/uL (ref 1.7–7.7)
Neutrophils Relative %: 67 % (ref 43–77)
Platelets: 293 10*3/uL (ref 150–400)
RBC: 4.71 MIL/uL (ref 3.87–5.11)
RDW: 13.4 % (ref 11.5–15.5)
WBC: 7.8 10*3/uL (ref 4.0–10.5)

## 2014-11-27 LAB — BASIC METABOLIC PANEL WITH GFR
BUN: 9 mg/dL (ref 6–23)
CALCIUM: 9.1 mg/dL (ref 8.4–10.5)
CO2: 26 mEq/L (ref 19–32)
CREATININE: 0.54 mg/dL (ref 0.50–1.10)
Chloride: 105 mEq/L (ref 96–112)
GFR, Est African American: >89 mL/min
GFR, Est Non African American: >89 mL/min
Glucose, Bld: 70 mg/dL (ref 70–99)
Potassium: 3.9 mEq/L (ref 3.5–5.3)
Sodium: 140 mEq/L (ref 135–145)

## 2014-11-27 LAB — MAGNESIUM: MAGNESIUM: 2.5 mg/dL (ref 1.5–2.5)

## 2014-11-27 NOTE — Progress Notes (Signed)
Patient ID: Emily Graham, female   DOB: 10-29-1961, 53 y.o.   MRN: [id number]  Annual  Comprehensive Examination  This very nice 53 y.o.MWF presents for complete physical.  Patient has been followed for HTN, T2_NIDDM  Prediabetes, Hyperlipidemia, and Vitamin D Deficiency. Other problems include ADD.  About a week ago she was evaluated for moderate volume of bright red rectal bleeding and had an Abd CT scan suspect for bowel wall thickening in the mid transverse colon.. She did allegedly have a neg Colon in 2012 by Dr Deatra Ina.    HTN predates since 2000. Patient's BP has been controlled at home and patient denies any cardiac symptoms as chest pain, palpitations, shortness of breath, dizziness or ankle swelling. Today's BP: 140/82 mmHg    Patient's hyperlipidemia is controlled with diet and medications. Patient denies myalgias or other medication SE's. Last lipids were at goal - Total Chol 152; HDL 61; LDL 78; Trig 67 on 03/21/2014.   Patient has hx/o Morbid Obesity and underwent a Gastric Bipass in 2000 losing about 100 #. She also had hx/o reactive hypoglycemia and on several occasions had seizures secondary to severe hypoglycemia. Other problems include hx/o of prediabetes and patient denies recent reactive hypoglycemic symptoms, visual blurring, diabetic polys, or paresthesias. Last A1c was  5.3% 11/27/2013.   Having ongoing LBP and seeing Dr Mina Marble for EDSI's. Finally, patient has history of Vitamin D Deficiency of 27 in 2008  and last Vitamin D was 69 on 03/21/2014.  Medication Sig  . ADDERALL XR 20 MG  Take 1 capsule (20 mg total) by mouth every morning.  . Arginine 500 MG CAPS Take 500 mg by mouth daily.  Marland Kitchen aspirin 81 MG tablet Take 81 mg by mouth daily.  Marland Kitchen b complex  Take 1 tablet by mouth daily.  . Biotin  5 MG CAPS Take 5 mg by mouth daily.   Marland Kitchen gabapentin  600 MG tablet Take 1 tablet  3 times daily  . Ginkgo Biloba 40 MG TABS Take 1 tablet by mouth daily.  Marland Kitchen glucosamine-chondroitin 500-400 MG   Take 1 tablet by mouth daily.  . Lecithin 400 MG CAPS Take 400 mg by mouth daily.  Marland Kitchen L-CARNITINE 500 MG TABS Take 500 mg by mouth daily.  . milk thistle 175 MG tablet Take 175 mg by mouth daily.  . Multiple Vitamintablet Take 1 tablet by mouth daily.  Marland Kitchen NAPHCON-A 0.025-0.3 % ophthc soln  2 drops into both eyes 4  times daily as needed  . naproxen (NAPROSYN) 500 MG tablet Take 500 mg by mouth as needed.  . Omega-3 Fatty Acids (FISH OIL) 1200 MG CAPS Take 1 capsule by mouth daily.  . ondansetron (ZOFRAN ODT) 4 MG  Take 1 tablet  every 8 hours as needed  . PERCOCET 5-325 MG Take 1 tab every 6  hrs for severe pain.(fr ER)  . polycarbophil (FIBERCON) 625 MG tablet Take 625 mg by mouth daily.  . traMADol (ULTRAM) 50 MG tablet Take 1 tablet by mouth 2 (two) times daily.  . Vitamin D 50,000 UNITS  Take 50,000 Units by mouth daily or as directed   No Known Allergies   Past Medical History  Diagnosis Date  . HTN (hypertension)   . Hypoglycemia   . Goiter   . Murmur   . Dyspnea   . Cholecystitis, acute with cholelithiasis 07/19/2013  . Seizure disorder, primary generalized 07/19/2013    May be related to hypoglycemia  . Hypoglycemia 07/19/2013  . ADD (  attention deficit disorder) 07/19/2013  . GERD (gastroesophageal reflux disease) 07/19/2013  . Chronic left shoulder pain 07/19/2013  . Arthritis   . Seizures   . Pre-diabetes    Health Maintenance  Topic Date Due  . PAP SMEAR  10/02/1978  . COLONOSCOPY  10/02/2010  . INFLUENZA VACCINE  07/14/2014  . MAMMOGRAM  05/11/2015  . TETANUS/TDAP  11/07/2019   Immunization History  Administered Date(s) Administered  . Pneumococcal Polysaccharide-23 12/14/1998  . Tdap 11/06/2009   Past Surgical History  Procedure Laterality Date  . Gastric bypass    . Abdominal hysterectomy    . Breast enhancement surgery    . Total shoulder replacement    . Stomach tuck    . Cholecystectomy N/A 07/20/2013    Procedure: LAPAROSCOPIC CHOLECYSTECTOMY WITH  INTRAOPERATIVE CHOLANGIOGRAM;  Surgeon: Shann Medal, MD;  Location: [facility];  Service: General;  Laterality: N/A;  . Gastric bypass     Family History  Problem Relation Age of Onset  . Coronary artery disease Father     MI at age 61  . Heart disease Father   . Hypertension Father   . Diabetes Father   . Cancer Mother     skin  . Hypertension Mother   . Hyperlipidemia Mother   . Cancer Maternal Aunt     breast  . Cancer Paternal Aunt     breast  . Cancer Paternal Uncle     breast  . Cancer Maternal Aunt   . Cancer Cousin    History  Substance Use Topics  . Smoking status: Never Smoker   . Smokeless tobacco: Never Used  . Alcohol Use: 0.5 oz/week    1 drink(s) per week     Comment: Occasional    ROS Constitutional: Denies fever, chills, weight loss/gain, headaches, insomnia, fatigue, night sweats, and change in appetite. Eyes: Denies redness, blurred vision, diplopia, discharge, itchy, watery eyes.  ENT: Denies discharge, congestion, post nasal drip, epistaxis, sore throat, earache, hearing loss, dental pain, Tinnitus, Vertigo, Sinus pain, snoring.  Cardio: Denies chest pain, palpitations, irregular heartbeat, syncope, dyspnea, diaphoresis, orthopnea, PND, claudication, edema Respiratory: denies cough, dyspnea, DOE, pleurisy, hoarseness, laryngitis, wheezing.  Gastrointestinal: Denies dysphagia, heartburn, reflux, water brash, pain, cramps, nausea, vomiting, bloating, diarrhea, constipation, hematemesis, melena, hematochezia, jaundice, hemorrhoids Genitourinary: Denies dysuria, frequency, urgency, nocturia, hesitancy, discharge, hematuria, flank pain Breast: Breast lumps, nipple discharge, bleeding.  Musculoskeletal: Denies arthralgia, myalgia, stiffness, Jt. Swelling, pain, limp, and strain/sprain. Denies falls. Skin: Denies puritis, rash, hives, warts, acne, eczema, changing in skin lesion Neuro: No weakness, tremor, incoordination, spasms, paresthesia, pain Psychiatric:  Denies confusion, memory loss, sensory loss. Denies Depression. Endocrine: Denies change in weight, skin, hair change, nocturia, and paresthesia, diabetic polys, visual blurring, hyper / hypo glycemic episodes.  Heme/Lymph: No excessive bleeding, bruising, enlarged lymph nodes.  Physical Exam  BP 140/82   Pulse 88  Temp 98.1 F   Resp 16  Ht 5' 1"    Wt 160 lb 6.4 oz            BMI 30.32  General Appearance: Well nourished and in no apparent distress. Eyes: PERRLA, EOMs, conjunctiva no swelling or erythema, normal fundi and vessels. Sinuses: No frontal/maxillary tenderness ENT/Mouth: EACs patent / TMs  nl. Nares clear without erythema, swelling, mucoid exudates. Oral hygiene is good. No erythema, swelling, or exudate. Tongue normal, non-obstructing. Tonsils not swollen or erythematous. Hearing normal.  Neck: Supple, thyroid normal. No bruits, nodes or JVD. Respiratory: Respiratory effort normal.  BS  equal and clear bilateral without rales, rhonci, wheezing or stridor. Cardio: Heart sounds are normal with regular rate and rhythm and no murmurs, rubs or gallops. Peripheral pulses are normal and equal bilaterally without edema. No aortic or femoral bruits. Chest: symmetric with normal excursions and percussion. Breasts: Symmetric, without lumps, nipple discharge, retractions, or fibrocystic changes.  Abdomen: Flat, soft, with bowl sounds. Nontender, no guarding, rebound, hernias, masses, or organomegaly.  Lymphatics: Non tender without lymphadenopathy.  Genitourinary:  Musculoskeletal: Full ROM all peripheral extremities, joint stability, 5/5 strength, and normal gait. Skin: Warm and dry without rashes, lesions, cyanosis, clubbing or  ecchymosis.  Neuro: Cranial nerves intact, reflexes equal bilaterally. Normal muscle tone, no cerebellar symptoms. Sensation intact.  Pysch: Awake and oriented X 3, normal affect, Insight and Judgment appropriate.   Assessment and Plan  1. Hypertension  2.  Hyperlipidemia 3. Pre Diabetes 4. Vitamin D Deficiency 5. Morbid Obesity 6. Hematochezia 7. Ch Lumbago   Continue prudent diet as discussed, weight control, BP monitoring, regular exercise, and medications. Discussed med's effects and SE's. Screening labs and tests as requested with regular follow-up as recommended. Refer back to Dr Deatra Ina to consider lower GI visualization.

## 2014-11-27 NOTE — Patient Instructions (Signed)
Recommend the book "The END of DIETING" by Dr Baker Janus   and the book "The END of DIABETES " by Dr Excell Seltzer  At [email or web address] - get book & Audio CD's      Being diabetic has a  300% increased risk for heart attack, stroke, cancer, and alzheimer- type vascular dementia. It is very important that you work harder with diet by avoiding all foods that are white except chicken & fish. Avoid white rice (brown & wild rice is OK), white potatoes (sweetpotatoes in moderation is OK), White bread or wheat bread or anything made out of white flour like bagels, donuts, rolls, buns, biscuits, cakes, pastries, cookies, pizza crust, and pasta (made from white flour & egg whites) - vegetarian pasta or spinach or wheat pasta is OK. Multigrain breads like Arnold's or [employer], or multigrain sandwich thins or flatbreads.  Diet, exercise and weight loss can reverse and cure diabetes in the early stages.  Diet, exercise and weight loss is very important in the control and prevention of complications of diabetes which affects every system in your body, ie. Brain - dementia/stroke, eyes - glaucoma/blindness, heart - heart attack/heart failure, kidneys - dialysis, stomach - gastric paralysis, intestines - malabsorption, nerves - severe painful neuritis, circulation - gangrene & loss of a leg(s), and finally cancer and Alzheimers.    I recommend avoid fried & greasy foods,  sweets/candy, white rice (brown or wild rice or Quinoa is OK), white potatoes (sweet potatoes are OK) - anything made from white flour - bagels, doughnuts, rolls, buns, biscuits,white and wheat breads, pizza crust and traditional pasta made of white flour & egg white(vegetarian pasta or spinach or wheat pasta is OK).  Multi-grain bread is OK - like multi-grain flat bread or sandwich thins. Avoid alcohol in excess. Exercise is also important.    Eat all the vegetables you want - avoid meat, especially red meat and dairy - especially cheese.  Cheese  is the most concentrated form of trans-fats which is the worst thing to clog up our arteries. Veggie cheese is OK which can be found in the fresh produce section at [employer] or [employer] or [employer]  Preventive Care for Adults A healthy lifestyle and preventive care can promote health and wellness. Preventive health guidelines for women include the following key practices.  A routine yearly physical is a good way to check with your health care provider about your health and preventive screening. It is a chance to share any concerns and updates on your health and to receive a thorough exam.  Visit your dentist for a routine exam and preventive care every 6 months. Brush your teeth twice a day and floss once a day. Good oral hygiene prevents tooth decay and gum disease.  The frequency of eye exams is based on your age, health, family medical history, use of contact lenses, and other factors. Follow your health care provider's recommendations for frequency of eye exams.  Eat a healthy diet. Foods like vegetables, fruits, whole grains, low-fat dairy products, and lean protein foods contain the nutrients you need without too many calories. Decrease your intake of foods high in solid fats, added sugars, and salt. Eat the right amount of calories for you.Get information about a proper diet from your health care provider, if necessary.  Regular physical exercise is one of the most important things you can do for your health. Most adults should get at least 150 minutes of moderate-intensity exercise (any activity that increases  your heart rate and causes you to sweat) each week. In addition, most adults need muscle-strengthening exercises on 2 or more days a week.  Maintain a healthy weight. The body mass index (BMI) is a screening tool to identify possible weight problems. It provides an estimate of body fat based on height and weight. Your health care provider can find your BMI and can help you  achieve or maintain a healthy weight.For adults 20 years and older:  A BMI below 18.5 is considered underweight.  A BMI of 18.5 to 24.9 is normal.  A BMI of 25 to 29.9 is considered overweight.  A BMI of 30 and above is considered obese.  Maintain normal blood lipids and cholesterol levels by exercising and minimizing your intake of saturated fat. Eat a balanced diet with plenty of fruit and vegetables. Blood tests for lipids and cholesterol should begin at age 38 and be repeated every 5 years. If your lipid or cholesterol levels are high, you are over 50, or you are at high risk for heart disease, you may need your cholesterol levels checked more frequently.Ongoing high lipid and cholesterol levels should be treated with medicines if diet and exercise are not working.  If you smoke, find out from your health care provider how to quit. If you do not use tobacco, do not start.  Lung cancer screening is recommended for adults aged 64-80 years who are at high risk for developing lung cancer because of a history of smoking. A yearly low-dose CT scan of the lungs is recommended for people who have at least a 30-pack-year history of smoking and are a current smoker or have quit within the past 15 years. A pack year of smoking is smoking an average of 1 pack of cigarettes a day for 1 year (for example: 1 pack a day for 30 years or 2 packs a day for 15 years). Yearly screening should continue until the smoker has stopped smoking for at least 15 years. Yearly screening should be stopped for people who develop a health problem that would prevent them from having lung cancer treatment.  High blood pressure causes heart disease and increases the risk of stroke. Your blood pressure should be checked at least every 1 to 2 years. Ongoing high blood pressure should be treated with medicines if weight loss and exercise do not work.  If you are 79-57 years old, ask your health care provider if you should take  aspirin to prevent strokes.  Diabetes screening involves taking a blood sample to check your fasting blood sugar level. This should be done once every 3 years, after age 37, if you are within normal weight and without risk factors for diabetes. Testing should be considered at a younger age or be carried out more frequently if you are overweight and have at least 1 risk factor for diabetes.  Breast cancer screening is essential preventive care for women. You should practice "breast self-awareness." This means understanding the normal appearance and feel of your breasts and may include breast self-examination. Any changes detected, no matter how small, should be reported to a health care provider. Women in their 76s and 30s should have a clinical breast exam (CBE) by a health care provider as part of a regular health exam every 1 to 3 years. After age 55, women should have a CBE every year. Starting at age 79, women should consider having a mammogram (breast X-ray test) every year. Women who have a family history of breast  cancer should talk to their health care provider about genetic screening. Women at a high risk of breast cancer should talk to their health care providers about having an MRI and a mammogram every year.  Breast cancer gene (BRCA)-related cancer risk assessment is recommended for women who have family members with BRCA-related cancers. BRCA-related cancers include breast, ovarian, tubal, and peritoneal cancers. Having family members with these cancers may be associated with an increased risk for harmful changes (mutations) in the breast cancer genes BRCA1 and BRCA2. Results of the assessment will determine the need for genetic counseling and BRCA1 and BRCA2 testing.  Routine pelvic exams to screen for cancer are no longer recommended for nonpregnant women who are considered low risk for cancer of the pelvic organs (ovaries, uterus, and vagina) and who do not have symptoms. Ask your health  care provider if a screening pelvic exam is right for you.  If you have had past treatment for cervical cancer or a condition that could lead to cancer, you need Pap tests and screening for cancer for at least 20 years after your treatment. If Pap tests have been discontinued, your risk factors (such as having a new sexual partner) need to be reassessed to determine if screening should be resumed. Some women have medical problems that increase the chance of getting cervical cancer. In these cases, your health care provider may recommend more frequent screening and Pap tests.  Colorectal cancer can be detected and often prevented. Most routine colorectal cancer screening begins at the age of 58 years and continues through age 44 years. However, your health care provider may recommend screening at an earlier age if you have risk factors for colon cancer. On a yearly basis, your health care provider may provide home test kits to check for hidden blood in the stool. Use of a small camera at the end of a tube, to directly examine the colon (sigmoidoscopy or colonoscopy), can detect the earliest forms of colorectal cancer. Talk to your health care provider about this at age 84, when routine screening begins. Direct exam of the colon should be repeated every 5-10 years through age 33 years, unless early forms of pre-cancerous polyps or small growths are found.  Hepatitis C blood testing is recommended for all people born from [birth year] through 1965 and any individual with known risks for hepatitis C.  Pra  Osteoporosis is a disease in which the bones lose minerals and strength with aging. This can result in serious bone fractures or breaks. The risk of osteoporosis can be identified using a bone density scan. Women ages 36 years and over and women at risk for fractures or osteoporosis should discuss screening with their health care providers. Ask your health care provider whether you should take a calcium supplement  or vitamin D to reduce the rate of osteoporosis.  Menopause can be associated with physical symptoms and risks. Hormone replacement therapy is available to decrease symptoms and risks. You should talk to your health care provider about whether hormone replacement therapy is right for you.  Use sunscreen. Apply sunscreen liberally and repeatedly throughout the day. You should seek shade when your shadow is shorter than you. Protect yourself by wearing long sleeves, pants, a wide-brimmed hat, and sunglasses year round, whenever you are outdoors.  Once a month, do a whole body skin exam, using a mirror to look at the skin on your back. Tell your health care provider of new moles, moles that have irregular borders, moles that are  larger than a pencil eraser, or moles that have changed in shape or color.  Stay current with required vaccines (immunizations).  Influenza vaccine. All adults should be immunized every year.  Tetanus, diphtheria, and acellular pertussis (Td, Tdap) vaccine. Pregnant women should receive 1 dose of Tdap vaccine during each pregnancy. The dose should be obtained regardless of the length of time since the last dose. Immunization is preferred during the 27th-36th week of gestation. An adult who has not previously received Tdap or who does not know her vaccine status should receive 1 dose of Tdap. This initial dose should be followed by tetanus and diphtheria toxoids (Td) booster doses every 10 years. Adults with an unknown or incomplete history of completing a 3-dose immunization series with Td-containing vaccines should begin or complete a primary immunization series including a Tdap dose. Adults should receive a Td booster every 10 years.  Varicella vaccine. An adult without evidence of immunity to varicella should receive 2 doses or a second dose if she has previously received 1 dose. Pregnant females who do not have evidence of immunity should receive the first dose after  pregnancy. This first dose should be obtained before leaving the health care facility. The second dose should be obtained 4-8 weeks after the first dose.  Human papillomavirus (HPV) vaccine. Females aged 13-26 years who have not received the vaccine previously should obtain the 3-dose series. The vaccine is not recommended for use in pregnant females. However, pregnancy testing is not needed before receiving a dose. If a female is found to be pregnant after receiving a dose, no treatment is needed. In that case, the remaining doses should be delayed until after the pregnancy. Immunization is recommended for any person with an immunocompromised condition through the age of 26 years if she did not get any or all doses earlier. During the 3-dose series, the second dose should be obtained 4-8 weeks after the first dose. The third dose should be obtained 24 weeks after the first dose and 16 weeks after the second dose.  Zoster vaccine. One dose is recommended for adults aged 60 years or older unless certain conditions are present.  Measles, mumps, and rubella (MMR) vaccine. Adults born before 1957 generally are considered immune to measles and mumps. Adults born in 1957 or later should have 1 or more doses of MMR vaccine unless there is a contraindication to the vaccine or there is laboratory evidence of immunity to each of the three diseases. A routine second dose of MMR vaccine should be obtained at least 28 days after the first dose for students attending postsecondary schools, health care workers, or international travelers. People who received inactivated measles vaccine or an unknown type of measles vaccine during 1963-1967 should receive 2 doses of MMR vaccine. People who received inactivated mumps vaccine or an unknown type of mumps vaccine before 1979 and are at high risk for mumps infection should consider immunization with 2 doses of MMR vaccine. For females of childbearing age, rubella immunity should  be determined. If there is no evidence of immunity, females who are not pregnant should be vaccinated. If there is no evidence of immunity, females who are pregnant should delay immunization until after pregnancy. Unvaccinated health care workers born before 1957 who lack laboratory evidence of measles, mumps, or rubella immunity or laboratory confirmation of disease should consider measles and mumps immunization with 2 doses of MMR vaccine or rubella immunization with 1 dose of MMR vaccine.  Pneumococcal 13-valent conjugate (PCV13) vaccine. When   indicated, a person who is uncertain of her immunization history and has no record of immunization should receive the PCV13 vaccine. An adult aged 73 years or older who has certain medical conditions and has not been previously immunized should receive 1 dose of PCV13 vaccine. This PCV13 should be followed with a dose of pneumococcal polysaccharide (PPSV23) vaccine. The PPSV23 vaccine dose should be obtained at least 8 weeks after the dose of PCV13 vaccine. An adult aged 81 years or older who has certain medical conditions and previously received 1 or more doses of PPSV23 vaccine should receive 1 dose of PCV13. The PCV13 vaccine dose should be obtained 1 or more years after the last PPSV23 vaccine dose.    Pneumococcal polysaccharide (PPSV23) vaccine. When PCV13 is also indicated, PCV13 should be obtained first. All adults aged 69 years and older should be immunized. An adult younger than age 35 years who has certain medical conditions should be immunized. Any person who resides in a nursing home or long-term care facility should be immunized. An adult smoker should be immunized. People with an immunocompromised condition and certain other conditions should receive both PCV13 and PPSV23 vaccines. People with human immunodeficiency virus (HIV) infection should be immunized as soon as possible after diagnosis. Immunization during chemotherapy or radiation therapy should  be avoided. Routine use of PPSV23 vaccine is not recommended for American Indians, [place]atives, or people younger than 65 years unless there are medical conditions that require PPSV23 vaccine. When indicated, people who have unknown immunization and have no record of immunization should receive PPSV23 vaccine. One-time revaccination 5 years after the first dose of PPSV23 is recommended for people aged 19-64 years who have chronic kidney failure, nephrotic syndrome, asplenia, or immunocompromised conditions. People who received 1-2 doses of PPSV23 before age 79 years should receive another dose of PPSV23 vaccine at age 73 years or later if at least 5 years have passed since the previous dose. Doses of PPSV23 are not needed for people immunized with PPSV23 at or after age 19 years.  Preventive Services / Frequency   Ages 25 to 65 years  Blood pressure check.  Lipid and cholesterol check.  Lung cancer screening. / Every year if you are aged 8-80 years and have a 30-pack-year history of smoking and currently smoke or have quit within the past 15 years. Yearly screening is stopped once you have quit smoking for at least 15 years or develop a health problem that would prevent you from having lung cancer treatment.  Clinical breast exam.** / Every year after age 28 years.  BRCA-related cancer risk assessment.** / For women who have family members with a BRCA-related cancer (breast, ovarian, tubal, or peritoneal cancers).  Mammogram.** / Every year beginning at age 57 years and continuing for as long as you are in good health. Consult with your health care provider.  Pap test.** / Every 3 years starting at age 57 years through age 25 or 13 years with a history of 3 consecutive normal Pap tests.  HPV screening.** / Every 3 years from ages 21 years through ages 60 to 48 years with a history of 3 consecutive normal Pap tests.  Fecal occult blood test (FOBT) of stool. / Every year beginning at age 77  years and continuing until age 57 years. You may not need to do this test if you get a colonoscopy every 10 years.  Flexible sigmoidoscopy or colonoscopy.** / Every 5 years for a flexible sigmoidoscopy or every 10 years  for a colonoscopy beginning at age 104 years and continuing until age 39 years.  Hepatitis C blood test.** / For all people born from [birth year] through 1966 and any individual with known risks for hepatitis C.  Skin self-exam. / Monthly.  Influenza vaccine. / Every year.  Tetanus, diphtheria, and acellular pertussis (Tdap/Td) vaccine.** / Consult your health care provider. Pregnant women should receive 1 dose of Tdap vaccine during each pregnancy. 1 dose of Td every 10 years.  Varicella vaccine.** / Consult your health care provider. Pregnant females who do not have evidence of immunity should receive the first dose after pregnancy.  Zoster vaccine.** / 1 dose for adults aged 47 years or older.  Pneumococcal 13-valent conjugate (PCV13) vaccine.** / Consult your health care provider.  Pneumococcal polysaccharide (PPSV23) vaccine.** / 1 to 2 doses if you smoke cigarettes or if you have certain conditions.  Meningococcal vaccine.** / Consult your health care provider.  Hepatitis A vaccine.** / Consult your health care provider.  Hepatitis B vaccine.** / Consult your health care provider. Screening for abdominal aortic aneurysm (AAA)  by ultrasound is recommended for people over 50 who have history of high blood pressure or who are current or former smokers.

## 2014-11-28 ENCOUNTER — Encounter: Payer: Self-pay | Admitting: Gastroenterology

## 2014-11-28 LAB — URINALYSIS, MICROSCOPIC ONLY
Bacteria, UA: NONE SEEN
Casts: NONE SEEN
Squamous Epithelial / LPF: NONE SEEN

## 2014-11-28 LAB — VITAMIN B12: VITAMIN B 12: 1209 pg/mL — AB (ref 211–911)

## 2014-11-28 LAB — INSULIN, FASTING: INSULIN FASTING, SERUM: 17.2 u[IU]/mL (ref 2.0–19.6)

## 2014-11-28 LAB — HEMOGLOBIN A1C
HEMOGLOBIN A1C: 5.4 % (ref <5.7)
Mean Plasma Glucose: 108 mg/dL (ref <117)

## 2014-11-28 LAB — TSH: TSH: 1.756 u[IU]/mL (ref 0.350–4.500)

## 2014-11-28 LAB — VITAMIN D 25 HYDROXY (VIT D DEFICIENCY, FRACTURES): VIT D 25 HYDROXY: 70 ng/mL (ref 30–100)

## 2014-11-29 LAB — URINE CULTURE: Colony Count: 30000

## 2014-12-01 ENCOUNTER — Other Ambulatory Visit: Payer: Self-pay | Admitting: Internal Medicine

## 2014-12-12 ENCOUNTER — Other Ambulatory Visit: Payer: Self-pay | Admitting: Internal Medicine

## 2014-12-12 DIAGNOSIS — F988 Other specified behavioral and emotional disorders with onset usually occurring in childhood and adolescence: Secondary | ICD-10-CM

## 2014-12-12 MED ORDER — AMPHETAMINE-DEXTROAMPHET ER 20 MG PO CP24: 20.0000 mg | ORAL_CAPSULE | ORAL | Status: DC

## 2014-12-17 ENCOUNTER — Other Ambulatory Visit: Payer: Self-pay

## 2014-12-17 ENCOUNTER — Other Ambulatory Visit: Payer: Self-pay | Admitting: Internal Medicine

## 2014-12-17 DIAGNOSIS — R945 Abnormal results of liver function studies: Secondary | ICD-10-CM

## 2014-12-17 DIAGNOSIS — R7989 Other specified abnormal findings of blood chemistry: Secondary | ICD-10-CM

## 2014-12-25 ENCOUNTER — Other Ambulatory Visit: Payer: 59

## 2014-12-25 DIAGNOSIS — R7989 Other specified abnormal findings of blood chemistry: Secondary | ICD-10-CM

## 2014-12-25 DIAGNOSIS — R945 Abnormal results of liver function studies: Secondary | ICD-10-CM

## 2014-12-25 LAB — HEPATIC FUNCTION PANEL
ALBUMIN: 3.8 g/dL (ref 3.5–5.2)
ALT: 160 U/L — ABNORMAL HIGH (ref 0–35)
AST: 157 U/L — ABNORMAL HIGH (ref 0–37)
Alkaline Phosphatase: 102 U/L (ref 39–117)
BILIRUBIN TOTAL: 0.5 mg/dL (ref 0.2–1.2)
Bilirubin, Direct: 0.2 mg/dL (ref 0.0–0.3)
Indirect Bilirubin: 0.3 mg/dL (ref 0.2–1.2)
Total Protein: 6.5 g/dL (ref 6.0–8.3)

## 2015-01-29 ENCOUNTER — Other Ambulatory Visit (INDEPENDENT_AMBULATORY_CARE_PROVIDER_SITE_OTHER): Payer: 59

## 2015-01-29 ENCOUNTER — Encounter: Payer: Self-pay | Admitting: Gastroenterology

## 2015-01-29 ENCOUNTER — Ambulatory Visit (INDEPENDENT_AMBULATORY_CARE_PROVIDER_SITE_OTHER): Payer: 59 | Admitting: Gastroenterology

## 2015-01-29 VITALS — BP 140/90 | HR 84 | Ht 61.0 in | Wt 161.6 lb

## 2015-01-29 DIAGNOSIS — Z860100 Personal history of colon polyps, unspecified: Secondary | ICD-10-CM | POA: Insufficient documentation

## 2015-01-29 DIAGNOSIS — K625 Hemorrhage of anus and rectum: Secondary | ICD-10-CM

## 2015-01-29 DIAGNOSIS — R1033 Periumbilical pain: Secondary | ICD-10-CM

## 2015-01-29 DIAGNOSIS — R945 Abnormal results of liver function studies: Secondary | ICD-10-CM

## 2015-01-29 DIAGNOSIS — R195 Other fecal abnormalities: Secondary | ICD-10-CM

## 2015-01-29 DIAGNOSIS — R7989 Other specified abnormal findings of blood chemistry: Secondary | ICD-10-CM

## 2015-01-29 LAB — HEPATIC FUNCTION PANEL
ALT: 225 U/L — ABNORMAL HIGH (ref 0–35)
AST: 157 U/L — AB (ref 0–37)
Albumin: 4.2 g/dL (ref 3.5–5.2)
Alkaline Phosphatase: 100 U/L (ref 39–117)
BILIRUBIN TOTAL: 0.7 mg/dL (ref 0.2–1.2)
Bilirubin, Direct: 0.2 mg/dL (ref 0.0–0.3)
Total Protein: 7.4 g/dL (ref 6.0–8.3)

## 2015-01-29 LAB — CBC WITH DIFFERENTIAL/PLATELET
BASOS ABS: 0 10*3/uL (ref 0.0–0.1)
Basophils Relative: 0.5 % (ref 0.0–3.0)
Eosinophils Absolute: 0.2 10*3/uL (ref 0.0–0.7)
Eosinophils Relative: 3 % (ref 0.0–5.0)
HCT: 42.9 % (ref 36.0–46.0)
Hemoglobin: 14.4 g/dL (ref 12.0–15.0)
LYMPHS PCT: 21.3 % (ref 12.0–46.0)
Lymphs Abs: 1.5 10*3/uL (ref 0.7–4.0)
MCHC: 33.5 g/dL (ref 30.0–36.0)
MCV: 87.2 fl (ref 78.0–100.0)
Monocytes Absolute: 0.6 10*3/uL (ref 0.1–1.0)
Monocytes Relative: 8.6 % (ref 3.0–12.0)
NEUTROS PCT: 66.6 % (ref 43.0–77.0)
Neutro Abs: 4.7 10*3/uL (ref 1.4–7.7)
Platelets: 258 10*3/uL (ref 150.0–400.0)
RBC: 4.92 Mil/uL (ref 3.87–5.11)
RDW: 13.2 % (ref 11.5–15.5)
WBC: 7.1 10*3/uL (ref 4.0–10.5)

## 2015-01-29 MED ORDER — PANTOPRAZOLE SODIUM 40 MG PO TBEC: 40.0000 mg | DELAYED_RELEASE_TABLET | Freq: Every day | ORAL | Status: DC

## 2015-01-29 NOTE — Patient Instructions (Signed)
Your physician has requested that you go to the basement for the following lab work before leaving today: CBC, LFT's  You have been scheduled for a flexible sigmoidoscopy. Please follow the written instructions given to you at your visit today. If you use inhalers (even only as needed), please bring them with you on the day of your procedure.  CC:Dr Unk Pinto

## 2015-01-29 NOTE — Assessment & Plan Note (Signed)
Possible medication effect (tramadol) and/or hepatic steatosis.  Patient has been off tramadol for 6 weeks.  Recommendations #1 repeat LFTs

## 2015-01-29 NOTE — Assessment & Plan Note (Signed)
Follow-up colonoscopy 2017

## 2015-01-29 NOTE — Assessment & Plan Note (Signed)
One-year history of painless intermittent rectal bleeding consisting of bright red blood.  Suspect hemorrhoidal bleeding.  Colonic neoplasm in AVMs are much less likely in view of her relatively recent colonoscopy from 2012.  Recommendations #1 sigmoidoscopy line  #2 CBC #3 if hemorrhoids only are seen would consider band ligation

## 2015-01-29 NOTE — Progress Notes (Signed)
_                                                                                                                History of Present Illness:  Emily Graham is a 54 year old white female referred for evaluation of rectal bleeding.  For the past year she's had intermittent rectal bleeding consisting of bright red blood per rectum.  About every 2 weeks she may have a solid bowel movement Rana by blood and blood discoloring the toilet water.  She denies rectal or abdominal pain or change of bowel habits.  Colonoscopy in 2012 revealed 2 diminutive adenomatous polyps which were removed.  Until a month ago she was taking Naprosyn every 4 hours for back pain.  She developed burning periumbilical discomfort and stop Naprosyn.  This discomfort has continued.  The patient also was told that her liver tests were abnormal and the Marella Chimes was discontinued about 6 weeks ago.   Past Medical History  Diagnosis Date  . HTN (hypertension)   . Hypoglycemia   . Goiter   . Murmur   . Dyspnea   . Cholecystitis, acute with cholelithiasis 07/19/2013  . Seizure disorder, primary generalized 07/19/2013    May be related to hypoglycemia  . Hypoglycemia 07/19/2013  . ADD (attention deficit disorder) 07/19/2013  . GERD (gastroesophageal reflux disease) 07/19/2013  . Chronic left shoulder pain 07/19/2013  . Arthritis   . Seizures   . Pre-diabetes    Past Surgical History  Procedure Laterality Date  . Gastric bypass    . Abdominal hysterectomy    . Breast enhancement surgery    . Total shoulder replacement    . Stomach tuck    . Cholecystectomy N/A 07/20/2013    Procedure: LAPAROSCOPIC CHOLECYSTECTOMY WITH INTRAOPERATIVE CHOLANGIOGRAM;  Surgeon: Shann Medal, MD;  Location: [facility];  Service: General;  Laterality: N/A;  . Gastric bypass     family history includes Cancer in her cousin, maternal aunt, maternal aunt, mother, paternal aunt, and paternal uncle; Coronary artery disease in her father; Diabetes in her  father; Heart disease in her father; Hyperlipidemia in her mother; Hypertension in her father and mother. Current Outpatient Prescriptions  Medication Sig Dispense Refill  . Arginine 500 MG CAPS Take 500 mg by mouth daily.    Marland Kitchen aspirin 81 MG tablet Take 81 mg by mouth daily.    Marland Kitchen b complex vitamins tablet Take 1 tablet by mouth daily.    Marland Kitchen glucosamine-chondroitin 500-400 MG tablet Take 1 tablet by mouth daily.    . milk thistle 175 MG tablet Take 175 mg by mouth daily.    . Multiple Vitamin (MULTIVITAMIN) tablet Take 1 tablet by mouth daily.    . Multiple Vitamins-Minerals (MULTIVITAMIN ADULT PO) Take by mouth. Microplex Doterra    . naproxen (NAPROSYN) 500 MG tablet Take 500 mg by mouth as needed.    . NON FORMULARY Deep Blue Doterra Oil    . NON FORMULARY DDR, cellular complex    . NON FORMULARY  Slim and Sassy, metabolic blend    . NON FORMULARY Zendocrine    . NON FORMULARY Mitro 2 Max    . NON FORMULARY XEO Mega, omega complex    . NON FORMULARY Tri Ease Seasonal Blend    . NON FORMULARY Bone Nutrient    . Omega-3 Fatty Acids (FISH OIL) 1200 MG CAPS Take 1 capsule by mouth daily.    . polycarbophil (FIBERCON) 625 MG tablet Take 625 mg by mouth daily.    . Vitamin D, Ergocalciferol, (DRISDOL) 50000 UNITS CAPS capsule Take 1 capsule daily as directed for severe Vitamin D Deficiency (Patient taking differently: Take 50,000 Units by mouth daily. Take 1 capsule daily as directed for severe Vitamin D Deficiency) 30 capsule 99  . amphetamine-dextroamphetamine (ADDERALL XR) 20 MG 24 hr capsule Take 1 capsule (20 mg total) by mouth every morning. 30 capsule 0   No current facility-administered medications for this visit.   Allergies as of 01/29/2015  . (No Known Allergies)    reports that she has never smoked. She has never used smokeless tobacco. She reports that she drinks about 0.5 oz of alcohol per week. She reports that she does not use illicit drugs.   Review of Systems: Pertinent  positive and negative review of systems were noted in the above HPI section. All other review of systems were otherwise negative.  Vital signs were reviewed in today's medical record Physical Exam: General: Well developed , well nourished, no acute distress Skin: anicteric Head: Normocephalic and atraumatic Eyes:  sclerae anicteric, EOMI Ears: Normal auditory acuity Mouth: No deformity or lesions Neck: Supple, no masses or thyromegaly Lungs: Clear throughout to auscultation Heart: Regular rate and rhythm; no murmurs, rubs or bruits Abdomen: Soft, non tender and non distended. No masses, hepatosplenomegaly or hernias noted. Normal Bowel sounds Rectal: There are 2 small external skin tags Musculoskeletal: Symmetrical with no gross deformities  Skin: No lesions on visible extremities Pulses:  Normal pulses noted Extremities: No clubbing, cyanosis, edema or deformities noted Neurological: Alert oriented x 4, grossly nonfocal Cervical Nodes:  No significant cervical adenopathy Inguinal Nodes: No significant inguinal adenopathy Psychological:  Alert and cooperative. Normal mood and affect  See Assessment and Plan under Problem List

## 2015-01-29 NOTE — Assessment & Plan Note (Signed)
Suspect NSAID-induced abdominal pain although patient has been off NSAIDs for one month.  Recommendations #1 begin Protonix 40 mg Staley #2 in abdominal pain does not subside within 7-10 days we'll proceed with upper endoscopy

## 2015-02-01 ENCOUNTER — Other Ambulatory Visit: Payer: Self-pay

## 2015-02-01 DIAGNOSIS — R748 Abnormal levels of other serum enzymes: Secondary | ICD-10-CM

## 2015-02-04 ENCOUNTER — Other Ambulatory Visit: Payer: 59

## 2015-02-04 DIAGNOSIS — R748 Abnormal levels of other serum enzymes: Secondary | ICD-10-CM

## 2015-02-05 LAB — HEPATITIS C ANTIBODY: HCV AB: NEGATIVE

## 2015-02-05 LAB — HEPATITIS B SURFACE ANTIGEN: HEP B S AG: NEGATIVE

## 2015-02-05 LAB — HEPATITIS B SURFACE ANTIBODY,QUALITATIVE: HEP B S AB: POSITIVE — AB

## 2015-02-12 ENCOUNTER — Other Ambulatory Visit: Payer: Self-pay

## 2015-02-12 DIAGNOSIS — R945 Abnormal results of liver function studies: Secondary | ICD-10-CM

## 2015-02-13 ENCOUNTER — Ambulatory Visit (HOSPITAL_COMMUNITY)
Admission: RE | Admit: 2015-02-13 | Discharge: 2015-02-13 | Disposition: A | Discharge status: Home / Self Care | Payer: 59 | Source: Ambulatory Visit | Attending: Gastroenterology | Admitting: Gastroenterology

## 2015-02-13 DIAGNOSIS — K760 Fatty (change of) liver, not elsewhere classified: Secondary | ICD-10-CM | POA: Diagnosis not present

## 2015-02-13 DIAGNOSIS — R945 Abnormal results of liver function studies: Secondary | ICD-10-CM

## 2015-02-13 DIAGNOSIS — R7989 Other specified abnormal findings of blood chemistry: Secondary | ICD-10-CM | POA: Diagnosis present

## 2015-02-14 ENCOUNTER — Other Ambulatory Visit: Payer: Self-pay

## 2015-02-14 DIAGNOSIS — R945 Abnormal results of liver function studies: Secondary | ICD-10-CM

## 2015-02-14 DIAGNOSIS — K7400 Hepatic fibrosis, unspecified: Secondary | ICD-10-CM

## 2015-02-15 ENCOUNTER — Other Ambulatory Visit: Payer: Self-pay

## 2015-02-15 DIAGNOSIS — K7400 Hepatic fibrosis, unspecified: Secondary | ICD-10-CM

## 2015-03-04 ENCOUNTER — Ambulatory Visit (AMBULATORY_SURGERY_CENTER): Payer: 59 | Admitting: Gastroenterology

## 2015-03-04 ENCOUNTER — Encounter: Payer: Self-pay | Admitting: Gastroenterology

## 2015-03-04 VITALS — BP 131/78 | HR 73 | Temp 97.6°F | Resp 3 | Ht 61.0 in | Wt 161.0 lb

## 2015-03-04 DIAGNOSIS — K648 Other hemorrhoids: Secondary | ICD-10-CM

## 2015-03-04 DIAGNOSIS — R195 Other fecal abnormalities: Secondary | ICD-10-CM

## 2015-03-04 LAB — GLUCOSE, CAPILLARY
GLUCOSE-CAPILLARY: 76 mg/dL (ref 70–99)
Glucose-Capillary: 119 mg/dL — ABNORMAL HIGH (ref 70–99)

## 2015-03-04 MED ORDER — SODIUM CHLORIDE 0.9 % IV SOLN: 500.0000 mL | INTRAVENOUS | Status: DC

## 2015-03-04 MED ORDER — DEXTROSE 5 % IV SOLN
INTRAVENOUS | Status: DC
  Administered 2015-03-04: 16:00:00 via INTRAVENOUS

## 2015-03-04 NOTE — Patient Instructions (Signed)
Hemorrhoids seen today. Dr.Kaplan's nurse will call you with appointment for hemorrhoidal ligation in the office.  Call us with any questions or concerns. Thank you.  YOU HAD AN ENDOSCOPIC PROCEDURE TODAY AT [facility]:   Refer to the procedure report that was given to you for any specific questions about what was found during the examination.  If the procedure report does not answer your questions, please call your gastroenterologist to clarify.  If you requested that your care partner not be given the details of your procedure findings, then the procedure report has been included in a sealed envelope for you to review at your convenience later.  YOU SHOULD EXPECT: Some feelings of bloating in the abdomen. Passage of more gas than usual.  Walking can help get rid of the air that was put into your GI tract during the procedure and reduce the bloating. If you had a lower endoscopy (such as a colonoscopy or flexible sigmoidoscopy) you may notice spotting of blood in your stool or on the toilet paper. If you underwent a bowel prep for your procedure, you may not have a normal bowel movement for a few days.  Please Note:  You might notice some irritation and congestion in your nose or some drainage.  This is from the oxygen used during your procedure.  There is no need for concern and it should clear up in a day or so.  SYMPTOMS TO REPORT IMMEDIATELY:   Following lower endoscopy (colonoscopy or flexible sigmoidoscopy):  Excessive amounts of blood in the stool  Significant tenderness or worsening of abdominal pains  Swelling of the abdomen that is new, acute  Fever of 100F or higher   Following upper endoscopy (EGD)  Vomiting of blood or coffee ground material  New chest pain or pain under the shoulder blades  Painful or persistently difficult swallowing  New shortness of breath  Fever of 100F or higher  Black, tarry-looking stools  For urgent or emergent issues, a  gastroenterologist can be reached at any hour by calling [phone].   DIET: Your first meal following the procedure should be a small meal and then it is ok to progress to your normal diet. Heavy or fried foods are harder to digest and may make you feel nauseous or bloated.  Likewise, meals heavy in dairy and vegetables can increase bloating.  Drink plenty of fluids but you should avoid alcoholic beverages for 24 hours.  ACTIVITY:  You should plan to take it easy for the rest of today and you should NOT DRIVE or use heavy machinery until tomorrow (because of the sedation medicines used during the test).    FOLLOW UP: Our staff will call the number listed on your records the next business day following your procedure to check on you and address any questions or concerns that you may have regarding the information given to you following your procedure. If we do not reach you, we will leave a message.  However, if you are feeling well and you are not experiencing any problems, there is no need to return our call.  We will assume that you have returned to your regular daily activities without incident.  If any biopsies were taken you will be contacted by phone or by letter within the next 1-3 weeks.  Please call us at [phone] if you have not heard about the biopsies in 3 weeks.    SIGNATURES/CONFIDENTIALITY: You and/or your care partner have signed paperwork which will  be entered into your electronic medical record.  These signatures attest to the fact that that the information above on your After Visit Summary has been reviewed and is understood.  Full responsibility of the confidentiality of this discharge information lies with you and/or your care-partner.

## 2015-03-04 NOTE — Progress Notes (Signed)
Report to PACU, RN, vss, BBS= Clear.  

## 2015-03-04 NOTE — Op Note (Signed)
[facility]  [address] [place], [id number]   FLEXIBLE SIGMOIDOSCOPY PROCEDURE REPORT  PATIENT: Emily Graham, Emily Graham  MR#: [id number] BIRTHDATE: [birth year] , 43  yrs. old GENDER: female ENDOSCOPIST: Inda Castle, MD REFERRED BY: Unk Pinto, M.D. PROCEDURE DATE:  03/04/2015 PROCEDURE:   Sigmoidoscopy, diagnostic ASA CLASS:   Class II INDICATIONS:anal bleeding. MEDICATIONS: Monitored anesthesia care and Propofol 150 mg IV  DESCRIPTION OF PROCEDURE:   After the risks benefits and alternatives of the procedure were thoroughly explained, informed consent was obtained.  Digital exam revealed no abnormalities of the rectum. The [id number]  endoscope was introduced through the anus  and advanced to the descending colon , The exam was Without limitations.    The quality of the prep was    .  The instrument was then slowly withdrawn as the mucosa was fully examined.         COLON FINDINGS: Grade II hemorrhoids were found.   The colon mucosa was otherwise normal.    Retroflexed views revealed internal Grade II hemorrhoids.    The scope was then withdrawn from the patient and the procedure terminated.  COMPLICATIONS: There were no immediate complications.  ENDOSCOPIC IMPRESSION: 1.   Grade II hemorrhoids 2.   The colon mucosa was otherwise normal  RECOMMENDATIONS: band ligation internal hemorrhoids  REPEAT EXAM:  eSigned:  Inda Castle, MD 03/04/2015 4:55 PM   CC:

## 2015-03-05 ENCOUNTER — Telehealth: Payer: Self-pay | Admitting: *Deleted

## 2015-03-05 NOTE — Telephone Encounter (Signed)
  Follow up Call-  Call back number 03/04/2015  Post procedure Call Back phone  # [phone]  Permission to leave phone message Yes     Patient questions:  Do you have a fever, pain , or abdominal swelling? No. Pain Score  0 *  Have you tolerated food without any problems? Yes.    Have you been able to return to your normal activities? Yes.    Do you have any questions about your discharge instructions: Diet   No. Medications  No. Follow up visit  No.  Do you have questions or concerns about your Care? No.  Actions: * If pain score is 4 or above: No action needed, pain <4.

## 2015-03-13 ENCOUNTER — Ambulatory Visit (HOSPITAL_COMMUNITY): Payer: 59

## 2015-04-05 ENCOUNTER — Telehealth: Payer: Self-pay | Admitting: Gastroenterology

## 2015-04-05 NOTE — Telephone Encounter (Signed)
Scheduled for liver biopsy 04/09/15. PT/INR day before?

## 2015-04-05 NOTE — Telephone Encounter (Signed)
FYI She will come for the labs on 04/08/15. Her liver bx is 04/09/15. She will have her husband drive her and take her home. She flying to  [place] 04/10/15 and will be back 04/15/15. Asks to be contacted via her cell phone if anything is urgent.

## 2015-04-05 NOTE — Telephone Encounter (Signed)
yes

## 2015-04-08 ENCOUNTER — Telehealth: Payer: Self-pay | Admitting: Gastroenterology

## 2015-04-08 NOTE — Telephone Encounter (Signed)
Patient has decided to wait until after her trip to do the liver biopsy. Endo notified.

## 2015-04-08 NOTE — Telephone Encounter (Signed)
She has cancelled this appointment for the liver biopsy. She and I will speak when she returns from her trip and reschedule.

## 2015-04-09 ENCOUNTER — Encounter (HOSPITAL_COMMUNITY): Admission: RE | Payer: Self-pay | Source: Ambulatory Visit

## 2015-04-09 ENCOUNTER — Ambulatory Visit (HOSPITAL_COMMUNITY): Admission: RE | Admit: 2015-04-09 | Payer: 59 | Source: Ambulatory Visit | Admitting: Gastroenterology

## 2015-04-09 SURGERY — BIOPSY, LIVER
Anesthesia: LOCAL

## 2015-04-16 ENCOUNTER — Telehealth: Payer: Self-pay | Admitting: Gastroenterology

## 2015-04-16 ENCOUNTER — Other Ambulatory Visit: Payer: Self-pay

## 2015-04-16 DIAGNOSIS — K7400 Hepatic fibrosis, unspecified: Secondary | ICD-10-CM

## 2015-04-16 NOTE — Telephone Encounter (Signed)
Patient is now scheduled through radiology for an u/s placed liver biopsy. Scheduling will contact her with specific instructions and time for the appointment next week.

## 2015-04-23 ENCOUNTER — Other Ambulatory Visit: Payer: Self-pay | Admitting: Radiology

## 2015-04-24 ENCOUNTER — Other Ambulatory Visit: Payer: Self-pay | Admitting: Radiology

## 2015-04-24 ENCOUNTER — Other Ambulatory Visit (INDEPENDENT_AMBULATORY_CARE_PROVIDER_SITE_OTHER): Payer: 59

## 2015-04-24 DIAGNOSIS — R945 Abnormal results of liver function studies: Secondary | ICD-10-CM

## 2015-04-24 DIAGNOSIS — K7689 Other specified diseases of liver: Secondary | ICD-10-CM

## 2015-04-24 LAB — PROTIME-INR
INR: 1.1 ratio — AB (ref 0.8–1.0)
PROTHROMBIN TIME: 12.7 s (ref 9.6–13.1)

## 2015-04-25 ENCOUNTER — Ambulatory Visit (HOSPITAL_COMMUNITY)
Admission: RE | Admit: 2015-04-25 | Discharge: 2015-04-25 | Disposition: A | Discharge status: Home / Self Care | Payer: 59 | Source: Ambulatory Visit | Attending: Gastroenterology | Admitting: Gastroenterology

## 2015-04-25 ENCOUNTER — Ambulatory Visit (HOSPITAL_COMMUNITY): Payer: 59

## 2015-04-25 ENCOUNTER — Encounter (HOSPITAL_COMMUNITY): Payer: Self-pay

## 2015-04-25 DIAGNOSIS — G40409 Other generalized epilepsy and epileptic syndromes, not intractable, without status epilepticus: Secondary | ICD-10-CM | POA: Insufficient documentation

## 2015-04-25 DIAGNOSIS — K7400 Hepatic fibrosis, unspecified: Secondary | ICD-10-CM | POA: Insufficient documentation

## 2015-04-25 DIAGNOSIS — K7581 Nonalcoholic steatohepatitis (NASH): Secondary | ICD-10-CM | POA: Diagnosis not present

## 2015-04-25 DIAGNOSIS — Z7982 Long term (current) use of aspirin: Secondary | ICD-10-CM | POA: Diagnosis not present

## 2015-04-25 DIAGNOSIS — I10 Essential (primary) hypertension: Secondary | ICD-10-CM | POA: Insufficient documentation

## 2015-04-25 DIAGNOSIS — K219 Gastro-esophageal reflux disease without esophagitis: Secondary | ICD-10-CM | POA: Diagnosis not present

## 2015-04-25 DIAGNOSIS — Z79899 Other long term (current) drug therapy: Secondary | ICD-10-CM | POA: Insufficient documentation

## 2015-04-25 DIAGNOSIS — R7989 Other specified abnormal findings of blood chemistry: Secondary | ICD-10-CM | POA: Diagnosis present

## 2015-04-25 DIAGNOSIS — G4733 Obstructive sleep apnea (adult) (pediatric): Secondary | ICD-10-CM | POA: Insufficient documentation

## 2015-04-25 DIAGNOSIS — Z833 Family history of diabetes mellitus: Secondary | ICD-10-CM | POA: Diagnosis not present

## 2015-04-25 DIAGNOSIS — Z8249 Family history of ischemic heart disease and other diseases of the circulatory system: Secondary | ICD-10-CM | POA: Insufficient documentation

## 2015-04-25 LAB — CBC
HEMATOCRIT: 42.1 % (ref 36.0–46.0)
HEMOGLOBIN: 13.8 g/dL (ref 12.0–15.0)
MCH: 28.3 pg (ref 26.0–34.0)
MCHC: 32.8 g/dL (ref 30.0–36.0)
MCV: 86.4 fL (ref 78.0–100.0)
Platelets: 234 10*3/uL (ref 150–400)
RBC: 4.87 MIL/uL (ref 3.87–5.11)
RDW: 14.6 % (ref 11.5–15.5)
WBC: 6.1 10*3/uL (ref 4.0–10.5)

## 2015-04-25 LAB — PROTIME-INR
INR: 1.21 (ref 0.00–1.49)
PROTHROMBIN TIME: 15.5 s — AB (ref 11.6–15.2)

## 2015-04-25 LAB — APTT: aPTT: 28 seconds (ref 24–37)

## 2015-04-25 MED ORDER — LIDOCAINE HCL (PF) 1 % IJ SOLN
INTRAMUSCULAR | Status: AC
  Filled 2015-04-25: qty 10

## 2015-04-25 MED ORDER — FENTANYL CITRATE (PF) 100 MCG/2ML IJ SOLN
INTRAMUSCULAR | Status: AC | PRN
  Administered 2015-04-25: 50 ug via INTRAVENOUS

## 2015-04-25 MED ORDER — FENTANYL CITRATE (PF) 100 MCG/2ML IJ SOLN
INTRAMUSCULAR | Status: AC
  Filled 2015-04-25: qty 2

## 2015-04-25 MED ORDER — MIDAZOLAM HCL 2 MG/2ML IJ SOLN
INTRAMUSCULAR | Status: AC | PRN
  Administered 2015-04-25: 1 mg via INTRAVENOUS

## 2015-04-25 MED ORDER — MIDAZOLAM HCL 2 MG/2ML IJ SOLN
INTRAMUSCULAR | Status: AC
  Filled 2015-04-25: qty 2

## 2015-04-25 MED ORDER — SODIUM CHLORIDE 0.9 % IV SOLN
INTRAVENOUS | Status: DC
  Administered 2015-04-25: 09:00:00 via INTRAVENOUS

## 2015-04-25 NOTE — Sedation Documentation (Signed)
Patient denies pain and is resting comfortably.  

## 2015-04-25 NOTE — Discharge Instructions (Signed)
Biopsy Discharge Instructions  The procedure you just had is called a biopsy.  You may feel some discomfort after the local anesthetic wears off.  Your discomfort should improve over the next several days.  AFTER YOUR BIOPSY  Rest for the remainder of the day.  Avoid heavy lifting (more than 10 lb/4.5 kg).  If you have been given a general anesthetic or other medications to help you relax, you should not operate machinery, drive or make legal decisions for 24 hours after your procedure.  Additionally, someone must be available to drive you home.  Only take over-the-counter or prescription medicines for pain, discomfort, or fever as directed by your caregiver.  This can make bleeding worse.  You may resume your usual diet after the procedure.  Avoid alcoholic beverages for 24 hours after your procedure.  Keep the skin around your biopsy site clean and dry.  You may shower after 24 hours.  Cleanse and dry the biopsy site completely after you shower.  Avoid baths and swimming for 72 hours.  Complications are very uncommon after this procedure.  Go to the nearest Emergency Department or contact your caregiver if you develop any of the following symptoms:  Worsening pain  Bleeding  Swelling at the biopsy site  Light headedness or dizziness  Shortness of Breath  Fever or chills  Redness or increased pain or swelling at the biopsy site

## 2015-04-25 NOTE — Procedures (Signed)
Technically successful US guided biopsy of right lobe of the liver.  No immediate complications.  

## 2015-04-25 NOTE — H&P (Signed)
Referring Physician(s): Kaplan,Robert D  History of Present Illness: Emily Graham is a 54 y.o. female with elevated LFT's and US abdomen Elastography performed on 02/13/15 with evidence of hepatic steatosis and moderate risk for fibrosis.  She has been followed by Dr. Deatra Ina and is scheduled today for an image guided liver biopsy. She denies any current N/V or abdominal pain. She denies any chest pain, shortness of breath or palpitations. She denies any active signs of bleeding or excessive bruising. She denies any recent fever or chills. The patient has been diagnosed with OSA, but does not use a CPAP after significant weight loss. She has previously tolerated sedation without complications.    Past Medical History  Diagnosis Date  . HTN (hypertension)   . Hypoglycemia   . Goiter   . Murmur   . Dyspnea   . Cholecystitis, acute with cholelithiasis 07/19/2013  . Seizure disorder, primary generalized 07/19/2013    May be related to hypoglycemia  . Hypoglycemia 07/19/2013  . ADD (attention deficit disorder) 07/19/2013  . GERD (gastroesophageal reflux disease) 07/19/2013  . Chronic left shoulder pain 07/19/2013  . Arthritis   . Pre-diabetes   . Sleep apnea   . Seizures 2013    grand mal due to hypoglycemia    Past Surgical History  Procedure Laterality Date  . Gastric bypass    . Abdominal hysterectomy    . Breast enhancement surgery    . Total shoulder replacement    . Stomach tuck    . Cholecystectomy N/A 07/20/2013    Procedure: LAPAROSCOPIC CHOLECYSTECTOMY WITH INTRAOPERATIVE CHOLANGIOGRAM;  Surgeon: Shann Medal, MD;  Location: [facility];  Service: General;  Laterality: N/A;  . Gastric bypass    . Gastric bypass  2000    Allergies: Review of patient's allergies indicates no known allergies.  Medications: Prior to Admission medications   Medication Sig Start Date End Date Taking? Authorizing Provider  Arginine 500 MG CAPS Take 500 mg by mouth daily.   Yes Historical Provider, MD    aspirin 81 MG tablet Take 81 mg by mouth daily.   Yes Historical Provider, MD  b complex vitamins tablet Take 1 tablet by mouth daily.   Yes Historical Provider, MD  Cholecalciferol (VITAMIN D3) 50000 UNITS CAPS Take 50,000 Units by mouth. 5 times per week 02/18/15  Yes Historical Provider, MD  milk thistle 175 MG tablet Take 175 mg by mouth daily.   Yes Historical Provider, MD  Multiple Vitamin (MULTIVITAMIN) tablet Take 1 tablet by mouth daily.   Yes Historical Provider, MD  NON FORMULARY Deep Blue Doterra Oil   Yes Historical Provider, MD  NON FORMULARY DDR, cellular complex   Yes Historical Provider, MD  NON FORMULARY Slim and Sassy, metabolic blend   Yes Historical Provider, MD  NON FORMULARY Zendocrine   Yes Historical Provider, MD  NON FORMULARY Mitro 2 Max   Yes Historical Provider, MD  NON FORMULARY XEO Mega, omega complex   Yes Historical Provider, MD  NON FORMULARY Tri Ease Seasonal Blend   Yes Historical Provider, MD  NON FORMULARY Bone Nutrient   Yes Historical Provider, MD  NON FORMULARY Keratin   Yes Historical Provider, MD  NON FORMULARY Collagen Beauty Builder   Yes Historical Provider, MD  Omega-3 Fatty Acids (FISH OIL) 1200 MG CAPS Take 1 capsule by mouth daily.   Yes Historical Provider, MD  pantoprazole (PROTONIX) 40 MG tablet Take 40 mg by mouth daily. 02/21/15  Yes Historical Provider, MD  polycarbophil (FIBERCON) 625 MG tablet Take 625 mg by mouth daily.   Yes Historical Provider, MD  amphetamine-dextroamphetamine (ADDERALL XR) 20 MG 24 hr capsule Take 1 capsule (20 mg total) by mouth every morning. 12/12/14 01/11/15  Unk Pinto, MD     Family History  Problem Relation Age of Onset  . Coronary artery disease Father     MI at age 66  . Heart disease Father   . Hypertension Father   . Diabetes Father   . Cancer Mother     skin  . Hypertension Mother   . Hyperlipidemia Mother   . Cancer Maternal Aunt     breast  . Cancer Paternal Aunt     breast  . Cancer  Paternal Uncle     breast  . Cancer Maternal Aunt   . Cancer Cousin   . Colon cancer Neg Hx     History   Social History  . Marital Status: Married    Spouse Name: N/A  . Number of Children: 2  . Years of Education: N/A   Occupational History  .     Social History Main Topics  . Smoking status: Never Smoker   . Smokeless tobacco: Never Used  . Alcohol Use: 0.5 oz/week    1 Standard drinks or equivalent per week     Comment: Occasional  . Drug Use: No  . Sexual Activity: Yes   Other Topics Concern  . None   Social History Narrative   Review of Systems: A 12 point ROS discussed and pertinent positives are indicated in the HPI above.  All other systems are negative.  Review of Systems  Vital Signs: BP 143/84 mmHg  Pulse 81  Temp(Src) 97.8 F (36.6 C) (Oral)  Resp 18  Ht 5' 1"  (1.549 m)  Wt 155 lb (70.308 kg)  BMI 29.30 kg/m2  SpO2 100%  Physical Exam  Constitutional: She is oriented to person, place, and time. No distress.  HENT:  Head: Normocephalic and atraumatic.  Neck: No tracheal deviation present.  Cardiovascular: Normal rate and regular rhythm.  Exam reveals no gallop and no friction rub.   No murmur heard. Pulmonary/Chest: Effort normal and breath sounds normal. No respiratory distress. She has no wheezes. She has no rales.  Abdominal: Soft. Bowel sounds are normal. She exhibits no distension. There is no tenderness.  Neurological: She is alert and oriented to person, place, and time.  Skin: She is not diaphoretic.  Psychiatric: She has a normal mood and affect. Her behavior is normal. Thought content normal.    Mallampati Score:  MD Evaluation Airway: WNL Heart: WNL Abdomen: WNL Chest/ Lungs: WNL ASA  Classification: 3 Mallampati/Airway Score: Two  Imaging: No results found.  Labs:  CBC:  Recent Labs  11/20/14 0133 11/20/14 0153 11/27/14 1526 01/29/15 1418 04/25/15 0900  WBC 11.4*  --  7.8 7.1 6.1  HGB 13.7 14.6 14.5 14.4  13.8  HCT 40.0 43.0 43.4 42.9 42.1  PLT 158  --  293 258.0 234    COAGS:  Recent Labs  04/24/15 1233 04/25/15 0900  INR 1.1* 1.21  APTT  --  28    BMP:  Recent Labs  11/20/14 0153 11/27/14 1526  NA 131* 140  K 3.7 3.9  CL 96 105  CO2  --  26  GLUCOSE 111* 70  BUN 6 9  CALCIUM  --  9.1  CREATININE 0.50 0.54  GFRNONAA  --  >89  GFRAA  --  >89  LIVER FUNCTION TESTS:  Recent Labs  11/27/14 1526 12/25/14 1427 01/29/15 1418  BILITOT 0.5 0.5 0.7  AST 141* 157* 157*  ALT 176* 160* 225*  ALKPHOS 86 102 100  PROT 6.2 6.5 7.4  ALBUMIN 3.7 3.8 4.2    Assessment and Plan: Elevated LFT's  US abdomen Elastography 02/13/15 with evidence of hepatic steatosis and risk for fibrosis is moderate Scheduled today for an image guided liver biopsy with moderate sedation The patient has been NPO, no blood thinners taken, labs and vitals have been reviewed. Risks and Benefits discussed with the patient including, but not limited to bleeding, infection, damage to adjacent structures or low yield requiring additional tests. All of the patient's questions were answered, patient is agreeable to proceed. Consent signed and in chart. HTN OSA GERD   Thank you for this interesting consult.  I greatly enjoyed meeting Emily Graham and look forward to participating in their care.  SignedHedy Jacob 04/25/2015, 10:11 AM   I spent a total of 20 Minutes in face to face in clinical consultation, greater than 50% of which was counseling/coordinating care for elevated LFT's

## 2015-04-30 ENCOUNTER — Telehealth: Payer: Self-pay | Admitting: Gastroenterology

## 2015-04-30 NOTE — Telephone Encounter (Signed)
The elastography was denied by her insurance. She wants to appeal it. They are saying it is not the normal course of treatment. Can you help by writing a letter with a reference to supporting articles of something of that nature? She is coming in to discuss her results on 05/06/15.

## 2015-05-02 ENCOUNTER — Encounter: Payer: Self-pay | Admitting: Gastroenterology

## 2015-05-06 ENCOUNTER — Encounter: Payer: Self-pay | Admitting: Gastroenterology

## 2015-05-06 ENCOUNTER — Ambulatory Visit (INDEPENDENT_AMBULATORY_CARE_PROVIDER_SITE_OTHER): Payer: 59 | Admitting: Gastroenterology

## 2015-05-06 VITALS — BP 132/82 | HR 100 | Ht 61.75 in | Wt 155.5 lb

## 2015-05-06 DIAGNOSIS — K7581 Nonalcoholic steatohepatitis (NASH): Secondary | ICD-10-CM | POA: Diagnosis not present

## 2015-05-06 DIAGNOSIS — K625 Hemorrhage of anus and rectum: Secondary | ICD-10-CM

## 2015-05-06 NOTE — Assessment & Plan Note (Signed)
Rectal bleeding is secondary to internal hemorrhoids.  She's had no further bleeding for several months.  Band ligation was put on hold since she is now asymptomatic.

## 2015-05-06 NOTE — Assessment & Plan Note (Signed)
I had a 15-20 minute discussion regarding Emily Graham and implications.  Weight loss was advised.  Patient inquired whether this was result of her gastric bypass surgery.  I stated this is also a possibility.  20 minutes were spent with the patient and/or family.  Greater than 50% was spent in counseling and coordination of care with the patient

## 2015-05-06 NOTE — Progress Notes (Signed)
Emily Graham is here to discuss liver biopsy results.  She demonstrated steatohepatitis.  There was minimal fibrosis.  Findings were explained to the patient.  She has inflammatory changes but, for trichomoniasis, no fibrosis.  Changes are likely due to fatty infiltration of the liver.

## 2015-05-06 NOTE — Patient Instructions (Addendum)
Follow up as needed

## 2015-05-30 ENCOUNTER — Encounter: Payer: Self-pay | Admitting: Physician Assistant

## 2015-05-30 ENCOUNTER — Ambulatory Visit (INDEPENDENT_AMBULATORY_CARE_PROVIDER_SITE_OTHER): Payer: 59 | Admitting: Physician Assistant

## 2015-05-30 VITALS — BP 138/78 | HR 76 | Temp 97.7°F | Resp 16 | Ht 61.0 in | Wt 156.0 lb

## 2015-05-30 DIAGNOSIS — K7581 Nonalcoholic steatohepatitis (NASH): Secondary | ICD-10-CM

## 2015-05-30 DIAGNOSIS — K7400 Hepatic fibrosis, unspecified: Secondary | ICD-10-CM

## 2015-05-30 DIAGNOSIS — E559 Vitamin D deficiency, unspecified: Secondary | ICD-10-CM

## 2015-05-30 DIAGNOSIS — E785 Hyperlipidemia, unspecified: Secondary | ICD-10-CM

## 2015-05-30 DIAGNOSIS — Z79899 Other long term (current) drug therapy: Secondary | ICD-10-CM

## 2015-05-30 DIAGNOSIS — F988 Other specified behavioral and emotional disorders with onset usually occurring in childhood and adolescence: Secondary | ICD-10-CM

## 2015-05-30 DIAGNOSIS — R7303 Prediabetes: Secondary | ICD-10-CM

## 2015-05-30 DIAGNOSIS — R7309 Other abnormal glucose: Secondary | ICD-10-CM

## 2015-05-30 DIAGNOSIS — I10 Essential (primary) hypertension: Secondary | ICD-10-CM

## 2015-05-30 LAB — CBC WITH DIFFERENTIAL/PLATELET
Basophils Absolute: 0 10*3/uL (ref 0.0–0.1)
Basophils Relative: 0 % (ref 0–1)
EOS PCT: 3 % (ref 0–5)
Eosinophils Absolute: 0.2 10*3/uL (ref 0.0–0.7)
HCT: 41.3 % (ref 36.0–46.0)
Hemoglobin: 13.6 g/dL (ref 12.0–15.0)
LYMPHS ABS: 1.4 10*3/uL (ref 0.7–4.0)
LYMPHS PCT: 22 % (ref 12–46)
MCH: 28.5 pg (ref 26.0–34.0)
MCHC: 32.9 g/dL (ref 30.0–36.0)
MCV: 86.4 fL (ref 78.0–100.0)
MPV: 10 fL (ref 8.6–12.4)
Monocytes Absolute: 0.6 10*3/uL (ref 0.1–1.0)
Monocytes Relative: 9 % (ref 3–12)
NEUTROS ABS: 4.1 10*3/uL (ref 1.7–7.7)
Neutrophils Relative %: 66 % (ref 43–77)
PLATELETS: 275 10*3/uL (ref 150–400)
RBC: 4.78 MIL/uL (ref 3.87–5.11)
RDW: 15.8 % — ABNORMAL HIGH (ref 11.5–15.5)
WBC: 6.2 10*3/uL (ref 4.0–10.5)

## 2015-05-30 LAB — HEMOGLOBIN A1C
Hgb A1c MFr Bld: 5.3 % (ref <5.7)
Mean Plasma Glucose: 105 mg/dL (ref <117)

## 2015-05-30 MED ORDER — VITAMIN D3 1.25 MG (50000 UT) PO CAPS: ORAL_CAPSULE | ORAL | Status: DC

## 2015-05-30 MED ORDER — PANTOPRAZOLE SODIUM 40 MG PO TBEC: 40.0000 mg | DELAYED_RELEASE_TABLET | Freq: Every day | ORAL | Status: DC

## 2015-05-30 MED ORDER — AMPHETAMINE-DEXTROAMPHET ER 20 MG PO CP24: 20.0000 mg | ORAL_CAPSULE | ORAL | Status: DC

## 2015-05-30 NOTE — Progress Notes (Signed)
Assessment and Plan:  1. Hypertension -Continue medication, monitor blood pressure at home. Continue DASH diet.  Reminder to go to the ER if any CP, SOB, nausea, dizziness, severe HA, changes vision/speech, left arm numbness and tingling and jaw pain.  2. Cholesterol -Continue diet and exercise. Check cholesterol.   3. Prediabetes  -Continue diet and exercise. Check A1C  4. Vitamin D Def - check level and continue medications.   5. Patient is s/p gastric bypass - Check labs to do to screen for vitamin deficiencies associated with gastric bypass including Vitamin D, B12, iron. Recommend strict diet and exercise.   6. NASH/elevated liver function Check labs, avoid tylenol, alcohol, weight loss advised.   7. ADD Will refill adderall.    Continue diet and meds as discussed. Further disposition pending results of labs. Over 30 minutes of exam, counseling, chart review, and critical decision making was performed  HPI 54 y.o. female  presents for 3 month follow up on hypertension, cholesterol, prediabetes, and vitamin D deficiency.  She has ADD and is on adderall, but has been off due to insurance and would like to get back on it.   Her blood pressure has been controlled at home, today their BP is BP: 138/78 mmHg  She does workout. She denies chest pain, shortness of breath, dizziness.  She is not on cholesterol medication and denies myalgias. Her cholesterol is at goal. The cholesterol last visit was:   Lab Results  Component Value Date   CHOL 162 11/27/2014   HDL 39* 11/27/2014   LDLCALC 106* 11/27/2014   TRIG 83 11/27/2014   CHOLHDL 4.2 11/27/2014    She has been working on diet and exercise for prediabetes, she has lost 100 + lbs s/p gastric bypass in 2000 and has a history of reactive hypoglycemia with secondary seizures, and denies hypoglycemia , paresthesia of the feet, polydipsia, polyuria and visual disturbances. Last A1C in the office was:  Lab Results  Component Value  Date   HGBA1C 5.4 11/27/2014   Patient is on Vitamin D supplement, 50,000 5 days a week.  Lab Results  Component Value Date   VD25OH 70 11/27/2014     Had heme + stools and abnormal LFTs, with subsequent CT AB that showed bowel wall thickening. She had liver biopsy 04/28/2016 that demonstrated NASH. She had normal colonoscopy with Dr. Deatra Ina 2012 and had recent flex sig 03/04/2015 that showed internal hemorrhoids recommend a banding.  Will have repeat colonoscopy in 2017.  She has been off naproxen, tylenol, tramadol, and has not had any injections since Dec.  Lab Results  Component Value Date   ALT 225* 01/29/2015   AST 157* 01/29/2015   ALKPHOS 100 01/29/2015   BILITOT 0.7 01/29/2015   In addition, in right handed female, left thumb can not flex at MCP, has some pain at 1st MCP and will "catch/lock" if she tries to bend it. No injury. She has chronic lower back pain, follows with Dr. Mina Marble. Sees Dr. Berenice Primas for her knees.   BMI is Body mass index is 29.49 kg/(m^2)., she is working on diet and exercise. Wt Readings from Last 8 Encounters:  05/30/15 156 lb (70.761 kg)  05/06/15 155 lb 8 oz (70.534 kg)  04/25/15 155 lb (70.308 kg)  03/04/15 161 lb (73.029 kg)  01/29/15 161 lb 9.6 oz (73.301 kg)  11/27/14 160 lb 6.4 oz (72.757 kg)  03/21/14 157 lb (71.215 kg)  11/27/13 153 lb 12.8 oz (69.763 kg)    Current  Medications:  Current Outpatient Prescriptions on File Prior to Visit  Medication Sig Dispense Refill  . Arginine 500 MG CAPS Take 500 mg by mouth daily.    Marland Kitchen aspirin 81 MG tablet Take 81 mg by mouth daily.    Marland Kitchen b complex vitamins tablet Take 1 tablet by mouth daily.    . Cholecalciferol (VITAMIN D3) 50000 UNITS CAPS Take 50,000 Units by mouth. 5 times per week    . milk thistle 175 MG tablet Take 175 mg by mouth daily.    . Multiple Vitamin (MULTIVITAMIN) tablet Take 1 tablet by mouth daily.    . NON FORMULARY Deep Blue Doterra Oil    . NON FORMULARY DDR, cellular complex     . NON FORMULARY Slim and Sassy, metabolic blend    . NON FORMULARY Zendocrine    . NON FORMULARY Mitro 2 Max    . NON FORMULARY XEO Mega, omega complex    . NON FORMULARY Tri Ease Seasonal Blend    . NON FORMULARY Bone Nutrient    . NON FORMULARY Keratin    . NON FORMULARY Collagen Forensic psychologist    . Omega-3 Fatty Acids (FISH OIL) 1200 MG CAPS Take 1 capsule by mouth daily.    . pantoprazole (PROTONIX) 40 MG tablet Take 40 mg by mouth daily.    . polycarbophil (FIBERCON) 625 MG tablet Take 625 mg by mouth daily.    Marland Kitchen amphetamine-dextroamphetamine (ADDERALL XR) 20 MG 24 hr capsule Take 1 capsule (20 mg total) by mouth every morning. 30 capsule 0   No current facility-administered medications on file prior to visit.   Medical History:  Past Medical History  Diagnosis Date  . HTN (hypertension)   . Hypoglycemia   . Goiter   . Murmur   . Dyspnea   . Cholecystitis, acute with cholelithiasis 07/19/2013  . Seizure disorder, primary generalized 07/19/2013    May be related to hypoglycemia  . Hypoglycemia 07/19/2013  . ADD (attention deficit disorder) 07/19/2013  . GERD (gastroesophageal reflux disease) 07/19/2013  . Chronic left shoulder pain 07/19/2013  . Arthritis   . Pre-diabetes   . Sleep apnea   . Seizures 2013    grand mal due to hypoglycemia   Allergies: No Known Allergies   Review of Systems:  Review of Systems  Constitutional: Negative.   HENT: Negative.   Eyes: Negative.   Respiratory: Negative.   Cardiovascular: Negative.   Gastrointestinal: Negative.   Genitourinary: Negative.   Musculoskeletal: Positive for myalgias, back pain and joint pain. Negative for falls and neck pain.  Skin: Negative.   Neurological: Negative.   Endo/Heme/Allergies: Negative.   Psychiatric/Behavioral: Negative.     Family history- Review and unchanged Social history- Review and unchanged Physical Exam: BP 138/78 mmHg  Pulse 76  Temp(Src) 97.7 F (36.5 C)  Resp 16  Ht 5' 1"  (1.549 m)   Wt 156 lb (70.761 kg)  BMI 29.49 kg/m2 Wt Readings from Last 3 Encounters:  05/30/15 156 lb (70.761 kg)  05/06/15 155 lb 8 oz (70.534 kg)  04/25/15 155 lb (70.308 kg)   General Appearance: Well nourished, in no apparent distress. Eyes: PERRLA, EOMs, conjunctiva no swelling or erythema Sinuses: No Frontal/maxillary tenderness ENT/Mouth: Ext aud canals clear, TMs without erythema, bulging. No erythema, swelling, or exudate on post pharynx.  Tonsils not swollen or erythematous. Hearing normal.  Neck: Supple, thyroid normal.  Respiratory: Respiratory effort normal, BS equal bilaterally without rales, rhonchi, wheezing or stridor.  Cardio: RRR with  no MRGs. Brisk peripheral pulses without edema.  Abdomen: Soft, + BS,  Non tender, no guarding, rebound, hernias, masses. Lymphatics: Non tender without lymphadenopathy.  Musculoskeletal: Full ROM, 5/5 strength, Normal gait Skin: Warm, dry without rashes, lesions, ecchymosis.  Neuro: Cranial nerves intact. Normal muscle tone, no cerebellar symptoms. Psych: Awake and oriented X 3, normal affect, Insight and Judgment appropriate.    Vicie Mutters, PA-C 9:02 AM [address] [facility]

## 2015-05-30 NOTE — Patient Instructions (Signed)
Before you even begin to attack a weight-loss plan, it pays to remember this: You are not fat. You have fat. Losing weight isn't about blame or shame; it's simply another achievement to accomplish. Dieting is like any other skill-you have to buckle down and work at it. As long as you act in a smart, reasonable way, you'll ultimately get where you want to be. Here are some weight loss pearls for you.  1. It's Not a Diet. It's a Lifestyle Thinking of a diet as something you're on and suffering through only for the short term doesn't work. To shed weight and keep it off, you need to make permanent changes to the way you eat. It's OK to indulge occasionally, of course, but if you cut calories temporarily and then revert to your old way of eating, you'll gain back the weight quicker than you can say yo-yo. Use it to lose it. Research shows that one of the best predictors of long-term weight loss is how many pounds you drop in the first month. For that reason, nutritionists often suggest being stricter for the first two weeks of your new eating strategy to build momentum. Cut out added sugar and alcohol and avoid unrefined carbs. After that, figure out how you can reincorporate them in a way that's healthy and maintainable.  2. There's a Right Way to Exercise Working out burns calories and fat and boosts your metabolism by building muscle. But those trying to lose weight are notorious for overestimating the number of calories they burn and underestimating the amount they take in. Unfortunately, your system is biologically programmed to hold on to extra pounds and that means when you start exercising, your body senses the deficit and ramps up its hunger signals. If you're not diligent, you'll eat everything you burn and then some. Use it to lose it. Cardio gets all the exercise glory, but strength and interval training are the real heroes. They help you build lean muscle, which in turn increases your metabolism and  calorie-burning ability 3. Don't Overreact to Mild Hunger Some people have a hard time losing weight because of hunger anxiety. To them, being hungry is bad-something to be avoided at all costs-so they carry snacks with them and eat when they don't need to. Others eat because they're stressed out or bored. While you never want to get to the point of being ravenous (that's when bingeing is likely to happen), a hunger pang, a craving, or the fact that it's 3:00 p.m. should not send you racing for the vending machine or obsessing about the energy bar in your purse. Ideally, you should put off eating until your stomach is growling and it's difficult to concentrate.  Use it to lose it. When you feel the urge to eat, use the HALT method. Ask yourself, Am I really hungry? Or am I angry or anxious, lonely or bored, or tired? If you're still not certain, try the apple test. If you're truly hungry, an apple should seem delicious; if it doesn't, something else is going on. Or you can try drinking water and making yourself busy, if you are still hungry try a healthy snack.  4. Not All Calories Are Created Equal The mechanics of weight loss are pretty simple: Take in fewer calories than you use for energy. But the kind of food you eat makes all the difference. Processed food that's high in saturated fat and refined starch or sugar can cause inflammation that disrupts the hormone signals that tell  your brain you're full. The result: You eat a lot more.  Use it to lose it. Clean up your diet. Swap in whole, unprocessed foods, including vegetables, lean protein, and healthy fats that will fill you up and give you the biggest nutritional bang for your calorie buck. In a few weeks, as your brain starts receiving regular hunger and fullness signals once again, you'll notice that you feel less hungry overall and naturally start cutting back on the amount you eat.  5. Protein, Produce, and Plant-Based Fats Are Your Weight-Loss  [place] Here's why eating the three Ps regularly will help you drop pounds. Protein fills you up. You need it to build lean muscle, which keeps your metabolism humming so that you can torch more fat. People in a weight-loss program who ate double the recommended daily allowance for protein (about 110 grams for a 150-pound woman) lost 70 percent of their weight from fat, while people who ate the RDA lost only about 40 percent, one study found. Produce is packed with filling fiber. "It's very difficult to consume too many calories if you're eating a lot of vegetables. Example: Three cups of broccoli is a lot of food, yet only 93 calories. (Fruit is another story. It can be easy to overeat and can contain a lot of calories from sugar, so be sure to monitor your intake.) Plant-based fats like olive oil and those in avocados and nuts are healthy and extra satiating.  Use it to lose it. Aim to incorporate each of the three Ps into every meal and snack. People who eat protein throughout the day are able to keep weight off, according to a study in the [place] [employer]. In addition to meat, poultry and seafood, good sources are beans, lentils, eggs, tofu, and yogurt. As for fat, keep portion sizes in check by measuring out salad dressing, oil, and nut butters (shoot for one to two tablespoons). Finally, eat veggies or a little fruit at every meal. People who did that consumed 308 fewer calories but didn't feel any hungrier than when they didn't eat more produce.  7. How You Eat Is As Important As What You Eat In order for your brain to register that you're full, you need to focus on what you're eating. Sit down whenever you eat, preferably at a table. Turn off the TV or computer, put down your phone, and look at your food. Smell it. Chew slowly, and don't put another bite on your fork until you swallow. When women ate lunch this attentively, they consumed 30 percent less when snacking later than  those who listened to an audiobook at lunchtime, according to a study in the [place] [employer]. 8. Weighing Yourself Really Works The scale provides the best evidence about whether your efforts are paying off. Seeing the numbers tick up or down or stagnate is motivation to keep going-or to rethink your approach. A 2015 study at [facility] found that daily weigh-ins helped people lose more weight, keep it off, and maintain that loss, even after two years. Use it to lose it. Step on the scale at the same time every day for the best results. If your weight shoots up several pounds from one weigh-in to the next, don't freak out. Eating a lot of salt the night before or having your period is the likely culprit. The number should return to normal in a day or two. It's a steady climb that you need to do something about.  9. Too Much Stress and Too Little Sleep Are Your Enemies When you're tired and frazzled, your body cranks up the production of cortisol, the stress hormone that can cause carb cravings. Not getting enough sleep also boosts your levels of ghrelin, a hormone associated with hunger, while suppressing leptin, a hormone that signals fullness and satiety. People on a diet who slept only five and a half hours a night for two weeks lost 55 percent less fat and were hungrier than those who slept eight and a half hours, according to a study in the [place]. Use it to lose it. Prioritize sleep, aiming for seven hours or more a night, which research shows helps lower stress. And make sure you're getting quality zzz's. If a snoring spouse or a fidgety cat wakes you up frequently throughout the night, you may end up getting the equivalent of just four hours of sleep, according to a study from [facility]. Keep pets out of the bedroom, and use a white-noise app to drown out snoring. 10. You Will Hit a plateau-And You Can Bust Through It As you slim down, your  body releases much less leptin, the fullness hormone.  If you're not strength training, start right now. Building muscle can raise your metabolism to help you overcome a plateau. To keep your body challenged and burning calories, incorporate new moves and more intense intervals into your workouts or add another sweat session to your weekly routine. Alternatively, cut an extra 100 calories or so a day from your diet. Now that you've lost weight, your body simply doesn't need as much fuel.   Ways to cut 100 calories  1. Eat your eggs with hot sauce OR salsa instead of cheese.  Eggs are great for breakfast, but many people consider eggs and cheese to be BFFs. Instead of cheese-1 oz. of cheddar has 114 calories-top your eggs with hot sauce, which contains no calories and helps with satiety and metabolism. Salsa is also a great option!!  2. Top your toast, waffles or pancakes with mashed berries instead of jelly or syrup. Half a cup of berries-fresh, frozen or thawed-has about 40 calories, compared with 2 tbsp. of maple syrup or jelly, which both have about 100 calories. The berries will also give you a good punch of fiber, which helps keep you full and satisfied and won't spike blood sugar quickly like the jelly or syrup. 3. Swap the non-fat latte for black coffee with a splash of half-and-half. Contrary to its name, that non-fat latte has 130 calories and a startling 19g of carbohydrates per 16 oz. serving. Replacing that 'light' drinkable dessert with a black coffee with a splash of half-and-half saves you more than 100 calories per 16 oz. serving. 4. Sprinkle salads with freeze-dried raspberries instead of dried cranberries. If you want a sweet addition to your nutritious salad, stay away from dried cranberries. They have a whopping 130 calories per  cup and 30g carbohydrates. Instead, sprinkle freeze-dried raspberries guilt-free and save more than 100 calories per  cup serving, adding 3g of belly-filling  fiber. 5. Go for mustard in place of mayo on your sandwich. Mustard can add really nice flavor to any sandwich, and there are tons of varieties, from spicy to honey. A serving of mayo is 95 calories, versus 10 calories in a serving of mustard. 6. Choose a DIY salad dressing instead of the store-bought kind. Mix Dijon or whole grain mustard with low-fat Kefir or red wine vinegar  and garlic. 7. Use hummus as a spread instead of a dip. Use hummus as a spread on a high-fiber cracker or tortilla with a sandwich and save on calories without sacrificing taste. 8. Pick just one salad "accessory." Salad isn't automatically a calorie winner. It's easy to over-accessorize with toppings. Instead of topping your salad with nuts, avocado and cranberries (all three will clock in at 313 calories), just pick one. The next day, choose a different accessory, which will also keep your salad interesting. You don't wear all your jewelry every day, right? 9. Ditch the white pasta in favor of spaghetti squash. One cup of cooked spaghetti squash has about 40 calories, compared with traditional spaghetti, which comes with more than 200. Spaghetti squash is also nutrient-dense. It's a good source of fiber and Vitamins A and C, and it can be eaten just like you would eat pasta-with a great tomato sauce and [place] meatballs or with pesto, tofu and spinach, for example. 10. Dress up your chili, soups and stews with non-fat [place] yogurt instead of sour cream. Just a 'dollop' of sour cream can set you back 115 calories and a whopping 12g of fat-seven of which are of the artery-clogging variety. Added bonus: [place] yogurt is packed with muscle-building protein, calcium and B Vitamins. 11. Mash cauliflower instead of mashed potatoes. One cup of traditional mashed potatoes-in all their creamy goodness-has more than 200 calories, compared to mashed cauliflower, which you can typically eat for less than 100 calories per 1 cup serving.  Cauliflower is a great source of the antioxidant indole-3-carbinol (I3C), which may help reduce the risk of some cancers, like breast cancer. 12. Ditch the ice cream sundae in favor of a [place] yogurt parfait. Instead of a cup of ice cream or fro-yo for dessert, try 1 cup of nonfat Greek yogurt topped with fresh berries and a sprinkle of cacao nibs. Both toppings are packed with antioxidants, which can help reduce cellular inflammation and oxidative damage. And the comparison is a no-brainer: One cup of ice cream has about 275 calories; one cup of frozen yogurt has about 230; and a cup of Greek yogurt has just 130, plus twice the protein, so you're less likely to return to the freezer for a second helping. 13. Put olive oil in a spray container instead of using it directly from the bottle. Each tablespoon of olive oil is 120 calories and 15g of fat. Use a mister instead of pouring it straight into the pan or onto a salad. This allows for portion control and will save you more than 100 calories. 14. When baking, substitute canned pumpkin for butter or oil. Canned pumpkin-not pumpkin pie mix-is loaded with Vitamin A, which is important for skin and eye health, as well as immunity. And the comparisons are pretty crazy:  cup of canned pumpkin has about 40 calories, compared to butter or oil, which has more than 800 calories. Yes, 800 calories. Applesauce and mashed banana can also serve as good substitutions for butter or oil, usually in a 1:1 ratio. 15. Top casseroles with high-fiber cereal instead of breadcrumbs. Breadcrumbs are typically made with white bread, while breakfast cereals contain 5-9g of fiber per serving. Not only will you save more than 150 calories per  cup serving, the swap will also keep you more full and you'll get a metabolism boost from the added fiber. 16. Snack on pistachios instead of macadamia nuts. Believe it or not, you get the same amount of calories from 35  pistachios (100  calories) as you would from only five macadamia nuts. 17. Chow down on kale chips rather than potato chips. This is my favorite 'don't knock it 'till you try it' swap. Kale chips are so easy to make at home, and you can spice them up with a little grated parmesan or chili powder. Plus, they're a mere fraction of the calories of potato chips, but with the same crunch factor we crave so often. 18. Add seltzer and some fruit slices to your cocktail instead of soda or fruit juice. One cup of soda or fruit juice can pack on as much as 140 calories. Instead, use seltzer and fruit slices. The fruit provides valuable phytochemicals, such as flavonoids and anthocyanins, which help to combat cancer and stave off the aging process.   Trigger Finger Trigger finger (digital tendinitis and stenosing tenosynovitis) is a common disorder that causes an often painful catching of the fingers or thumb. It occurs as a clicking, snapping, or locking of a finger in the palm of the hand. This is caused by a problem with the tendons that flex or bend the fingers sliding smoothly through their sheaths. The condition may occur in any finger or a couple fingers at the same time.  The finger may lock with the finger curled or suddenly straighten out with a snap. This is more common in patients with rheumatoid arthritis and diabetes. Left untreated, the condition may get worse to the point where the finger becomes locked in flexion, like making a fist, or less commonly locked with the finger straightened out. CAUSES   Inflammation and scarring that lead to swelling around the tendon sheath.  Repeated or forceful movements.  Rheumatoid arthritis, an autoimmune disease that affects joints.  Gout.  Diabetes mellitus. SIGNS AND SYMPTOMS  Soreness and swelling of your finger.  A painful clicking or snapping as you bend and straighten your finger. DIAGNOSIS  Your health care provider will do a physical exam of your finger to  diagnose trigger finger. TREATMENT   Splinting for 6-8 weeks may be helpful.  Nonsteroidal anti-inflammatory medicines (NSAIDs) can help to relieve the pain and inflammation.  Cortisone injections, along with splinting, may speed up recovery. Several injections may be required. Cortisone may give relief after one injection.  Surgery is another treatment that may be used if conservative treatments do not work. Surgery can be minor, without incisions (a cut does not have to be made), and can be done with a needle through the skin.  Other surgical choices involve an open procedure in which the surgeon opens the hand through a small incision and cuts the pulley so the tendon can again slide smoothly. Your hand will still work fine. HOME CARE INSTRUCTIONS  Apply ice to the injured area, twice per day:  Put ice in a plastic bag.  Place a towel between your skin and the bag.  Leave the ice on for 20 minutes, 3-4 times a day.  Rest your hand often. MAKE SURE YOU:   Understand these instructions.  Will watch your condition.  Will get help right away if you are not doing well or get worse. Document Released: 09/19/2004 Document Revised: 08/02/2013 Document Reviewed: 05/02/2013 [facility] Patient Information 2015 [place], [place]. This information is not intended to replace advice given to you by your health care provider. Make sure you discuss any questions you have with your health care provider.

## 2015-05-31 LAB — HEPATIC FUNCTION PANEL
ALT: 103 U/L — ABNORMAL HIGH (ref 0–35)
AST: 94 U/L — AB (ref 0–37)
Albumin: 4.1 g/dL (ref 3.5–5.2)
Alkaline Phosphatase: 81 U/L (ref 39–117)
Bilirubin, Direct: 0.2 mg/dL (ref 0.0–0.3)
Indirect Bilirubin: 0.4 mg/dL (ref 0.2–1.2)
Total Bilirubin: 0.6 mg/dL (ref 0.2–1.2)
Total Protein: 6.9 g/dL (ref 6.0–8.3)

## 2015-05-31 LAB — TSH: TSH: 1.597 u[IU]/mL (ref 0.350–4.500)

## 2015-05-31 LAB — LIPID PANEL
Cholesterol: 176 mg/dL (ref 0–200)
HDL: 68 mg/dL (ref >=46)
LDL Cholesterol: 95 mg/dL (ref 0–99)
TRIGLYCERIDES: 66 mg/dL (ref <150)
Total CHOL/HDL Ratio: 2.6 Ratio
VLDL: 13 mg/dL (ref 0–40)

## 2015-05-31 LAB — BASIC METABOLIC PANEL WITH GFR
BUN: 11 mg/dL (ref 6–23)
CO2: 26 meq/L (ref 19–32)
Calcium: 9.3 mg/dL (ref 8.4–10.5)
Chloride: 106 mEq/L (ref 96–112)
Creat: 0.55 mg/dL (ref 0.50–1.10)
GFR, Est African American: >89 mL/min
GFR, Est Non African American: >89 mL/min
GLUCOSE: 88 mg/dL (ref 70–99)
POTASSIUM: 4.5 meq/L (ref 3.5–5.3)
Sodium: 141 mEq/L (ref 135–145)

## 2015-05-31 LAB — MAGNESIUM: MAGNESIUM: 2.1 mg/dL (ref 1.5–2.5)

## 2015-05-31 LAB — INSULIN, FASTING: INSULIN FASTING, SERUM: 4.7 u[IU]/mL (ref 2.0–19.6)

## 2015-05-31 LAB — VITAMIN D 25 HYDROXY (VIT D DEFICIENCY, FRACTURES): Vit D, 25-Hydroxy: 71 ng/mL (ref 30–100)

## 2015-08-30 ENCOUNTER — Encounter: Payer: Self-pay | Admitting: Physician Assistant

## 2015-08-30 ENCOUNTER — Ambulatory Visit (INDEPENDENT_AMBULATORY_CARE_PROVIDER_SITE_OTHER): Payer: 59 | Admitting: Physician Assistant

## 2015-08-30 VITALS — BP 130/82 | HR 84 | Temp 97.7°F | Resp 16 | Ht 61.0 in | Wt 151.6 lb

## 2015-08-30 DIAGNOSIS — F909 Attention-deficit hyperactivity disorder, unspecified type: Secondary | ICD-10-CM

## 2015-08-30 DIAGNOSIS — I10 Essential (primary) hypertension: Secondary | ICD-10-CM | POA: Diagnosis not present

## 2015-08-30 DIAGNOSIS — E559 Vitamin D deficiency, unspecified: Secondary | ICD-10-CM

## 2015-08-30 DIAGNOSIS — K7581 Nonalcoholic steatohepatitis (NASH): Secondary | ICD-10-CM

## 2015-08-30 DIAGNOSIS — R7309 Other abnormal glucose: Secondary | ICD-10-CM | POA: Diagnosis not present

## 2015-08-30 DIAGNOSIS — F988 Other specified behavioral and emotional disorders with onset usually occurring in childhood and adolescence: Secondary | ICD-10-CM

## 2015-08-30 DIAGNOSIS — Z79899 Other long term (current) drug therapy: Secondary | ICD-10-CM | POA: Diagnosis not present

## 2015-08-30 DIAGNOSIS — E785 Hyperlipidemia, unspecified: Secondary | ICD-10-CM | POA: Diagnosis not present

## 2015-08-30 DIAGNOSIS — R7303 Prediabetes: Secondary | ICD-10-CM

## 2015-08-30 LAB — CBC WITH DIFFERENTIAL/PLATELET
Basophils Absolute: 0 10*3/uL (ref 0.0–0.1)
Basophils Relative: 0 % (ref 0–1)
EOS ABS: 0.2 10*3/uL (ref 0.0–0.7)
EOS PCT: 5 % (ref 0–5)
HCT: 44.1 % (ref 36.0–46.0)
Hemoglobin: 14.3 g/dL (ref 12.0–15.0)
LYMPHS ABS: 1.2 10*3/uL (ref 0.7–4.0)
Lymphocytes Relative: 26 % (ref 12–46)
MCH: 28.4 pg (ref 26.0–34.0)
MCHC: 32.4 g/dL (ref 30.0–36.0)
MCV: 87.7 fL (ref 78.0–100.0)
MPV: 10.1 fL (ref 8.6–12.4)
Monocytes Absolute: 0.5 10*3/uL (ref 0.1–1.0)
Monocytes Relative: 11 % (ref 3–12)
NEUTROS PCT: 58 % (ref 43–77)
Neutro Abs: 2.6 10*3/uL (ref 1.7–7.7)
Platelets: 264 10*3/uL (ref 150–400)
RBC: 5.03 MIL/uL (ref 3.87–5.11)
RDW: 15.5 % (ref 11.5–15.5)
WBC: 4.5 10*3/uL (ref 4.0–10.5)

## 2015-08-30 MED ORDER — AMPHETAMINE-DEXTROAMPHET ER 20 MG PO CP24: 20.0000 mg | ORAL_CAPSULE | ORAL | Status: DC

## 2015-08-30 NOTE — Progress Notes (Signed)
Assessment and Plan:  1. Hypertension -Continue medication, monitor blood pressure at home. Continue DASH diet.  Reminder to go to the ER if any CP, SOB, nausea, dizziness, severe HA, changes vision/speech, left arm numbness and tingling and jaw pain.  2. Cholesterol -Continue diet and exercise. Check cholesterol.   3. Prediabetes  -Continue diet and exercise. Check A1C  4. Vitamin D Def - check level and continue medications.   5. Patient is s/p gastric bypass - Check labs to do to screen for vitamin deficiencies associated with gastric bypass including Vitamin D, B12, iron. Recommend strict diet and exercise.   6. NASH/elevated liver function Check labs, avoid tylenol, alcohol, weight loss advised.   7. ADD She is on adderall every day, the XR, she has been out of this week because she ran out.    Future Appointments Date Time [facility] [place]  11/29/2015 11:00 AM Unk Pinto, MD [facility] None    Continue diet and meds as discussed. Further disposition pending results of labs. Over 30 minutes of exam, counseling, chart review, and critical decision making was performed  HPI 54 y.o. female  presents for 3 month follow up on hypertension, cholesterol, prediabetes, and vitamin D deficiency.   Her blood pressure has been controlled at home, today their BP is BP: 130/82 mmHg  She does workout, walking daily with a neighbor. She denies chest pain, shortness of breath, dizziness.  She is not on cholesterol medication and denies myalgias. Her cholesterol is at goal. The cholesterol last visit was:   Lab Results  Component Value Date   CHOL 176 05/30/2015   HDL 68 05/30/2015   LDLCALC 95 05/30/2015   TRIG 66 05/30/2015   CHOLHDL 2.6 05/30/2015    She has been working on diet and exercise for prediabetes, she has lost 100 + lbs s/p gastric bypass in 2000 and has a history of reactive hypoglycemia with secondary seizures, and denies hypoglycemia , paresthesia of the  feet, polydipsia, polyuria and visual disturbances. Last A1C in the office was:  Lab Results  Component Value Date   HGBA1C 5.3 05/30/2015   Patient is on Vitamin D supplement, 50,000 5 days a week.  Lab Results  Component Value Date   VD25OH 71 05/30/2015     Had heme + stools and abnormal LFTs, with subsequent CT AB that showed bowel wall thickening. She had liver biopsy 04/28/2016 that demonstrated NASH. She had normal colonoscopy with Dr. Deatra Ina 2012 and had recent flex sig 03/04/2015 that showed internal hemorrhoids recommend a banding.  Will have repeat colonoscopy in 2017.  She has been off naproxen, tylenol, tramadol, and has not had any injections since Dec.  Lab Results  Component Value Date   ALT 103* 05/30/2015   AST 94* 05/30/2015   ALKPHOS 81 05/30/2015   BILITOT 0.6 05/30/2015   In addition, in right handed female, left thumb can not flex at MCP, has some pain at 1st MCP and will "catch/lock" if she tries to bend it. No injury. She has chronic lower back pain, follows with Dr. Mina Marble. Sees Dr. Berenice Primas for her knees.   BMI is Body mass index is 28.66 kg/(m^2)., she is working on diet and exercise. Wt Readings from Last 8 Encounters:  08/30/15 151 lb 9.6 oz (68.765 kg)  05/30/15 156 lb (70.761 kg)  05/06/15 155 lb 8 oz (70.534 kg)  04/25/15 155 lb (70.308 kg)  03/04/15 161 lb (73.029 kg)  01/29/15 161 lb 9.6 oz (73.301 kg)  11/27/14 160 lb 6.4 oz (72.757 kg)  03/21/14 157 lb (71.215 kg)    Current Medications:  Current Outpatient Prescriptions on File Prior to Visit  Medication Sig Dispense Refill  . amphetamine-dextroamphetamine (ADDERALL XR) 20 MG 24 hr capsule Take 1 capsule (20 mg total) by mouth every morning. 30 capsule 0  . Arginine 500 MG CAPS Take 500 mg by mouth daily.    Marland Kitchen aspirin 81 MG tablet Take 81 mg by mouth daily.    Marland Kitchen b complex vitamins tablet Take 1 tablet by mouth daily.    . Cholecalciferol (VITAMIN D3) 50000 UNITS CAPS Take daily or as directed  30 capsule 4  . milk thistle 175 MG tablet Take 175 mg by mouth daily.    . Multiple Vitamin (MULTIVITAMIN) tablet Take 1 tablet by mouth daily.    . NON FORMULARY Deep Blue Doterra Oil    . NON FORMULARY DDR, cellular complex    . NON FORMULARY Slim and Sassy, metabolic blend    . NON FORMULARY Zendocrine    . NON FORMULARY Mitro 2 Max    . NON FORMULARY XEO Mega, omega complex    . NON FORMULARY Tri Ease Seasonal Blend    . NON FORMULARY Bone Nutrient    . NON FORMULARY Keratin    . NON FORMULARY Collagen Forensic psychologist    . Omega-3 Fatty Acids (FISH OIL) 1200 MG CAPS Take 1 capsule by mouth daily.    . pantoprazole (PROTONIX) 40 MG tablet Take 1 tablet (40 mg total) by mouth daily. 30 tablet 5  . polycarbophil (FIBERCON) 625 MG tablet Take 625 mg by mouth daily.     No current facility-administered medications on file prior to visit.   Medical History:  Past Medical History  Diagnosis Date  . HTN (hypertension)   . Hypoglycemia   . Goiter   . Murmur   . Dyspnea   . Cholecystitis, acute with cholelithiasis 07/19/2013  . Seizure disorder, primary generalized 07/19/2013    May be related to hypoglycemia  . Hypoglycemia 07/19/2013  . ADD (attention deficit disorder) 07/19/2013  . GERD (gastroesophageal reflux disease) 07/19/2013  . Chronic left shoulder pain 07/19/2013  . Arthritis   . Pre-diabetes   . Sleep apnea   . Seizures 2013    grand mal due to hypoglycemia   Allergies: No Known Allergies   Review of Systems:  Review of Systems  Constitutional: Negative.   HENT: Negative.   Eyes: Negative.   Respiratory: Negative.   Cardiovascular: Negative.   Gastrointestinal: Negative.   Genitourinary: Negative.   Musculoskeletal: Positive for myalgias, back pain and joint pain. Negative for falls and neck pain.  Skin: Negative.   Neurological: Negative.   Endo/Heme/Allergies: Negative.   Psychiatric/Behavioral: Negative.     Family history- Review and unchanged Social history-  Review and unchanged Physical Exam: BP 130/82 mmHg  Pulse 84  Temp(Src) 97.7 F (36.5 C)  Resp 16  Ht 5' 1"  (1.549 m)  Wt 151 lb 9.6 oz (68.765 kg)  BMI 28.66 kg/m2 Wt Readings from Last 3 Encounters:  08/30/15 151 lb 9.6 oz (68.765 kg)  05/30/15 156 lb (70.761 kg)  05/06/15 155 lb 8 oz (70.534 kg)   General Appearance: Well nourished, in no apparent distress. Eyes: PERRLA, EOMs, conjunctiva no swelling or erythema Sinuses: No Frontal/maxillary tenderness ENT/Mouth: Ext aud canals clear, TMs without erythema, bulging. No erythema, swelling, or exudate on post pharynx.  Tonsils not swollen or erythematous. Hearing normal.  Neck: Supple, thyroid normal.  Respiratory: Respiratory effort normal, BS equal bilaterally without rales, rhonchi, wheezing or stridor.  Cardio: RRR with no MRGs. Brisk peripheral pulses without edema.  Abdomen: Soft, + BS,  Non tender, no guarding, rebound, hernias, masses. Lymphatics: Non tender without lymphadenopathy.  Musculoskeletal: Full ROM, 5/5 strength, Normal gait Skin: Warm, dry without rashes, lesions, ecchymosis.  Neuro: Cranial nerves intact. Normal muscle tone, no cerebellar symptoms. Psych: Awake and oriented X 3, normal affect, Insight and Judgment appropriate.    Vicie Mutters, PA-C 9:30 AM [facility]

## 2015-08-30 NOTE — Patient Instructions (Signed)
We want weight loss that will last so you should lose 1-2 pounds a week.  THAT IS IT! Please pick THREE things a month to change. Once it is a habit check off the item. Then pick another three items off the list to become habits.  If you are already doing a habit on the list GREAT!  Cross that item off! o Don't drink your calories. Ie, alcohol, soda, fruit juice, and sweet tea.  o Drink more water. Drink a glass when you feel hungry or before each meal.  o Eat breakfast - Complex carb and protein (likeDannon light and fit yogurt, oatmeal, fruit, eggs, [place] bacon). o Measure your cereal.  Eat no more than one cup a day. (ie [place]) o Eat an apple a day. o Add a vegetable a day. o Try a new vegetable a month. o Use Pam! Stop using oil or butter to cook. o Don't finish your plate or use smaller plates. o Share your dessert. o Eat sugar free Jello for dessert or frozen grapes. o Don't eat 2-3 hours before bed. o Switch to whole wheat bread, pasta, and brown rice. o Make healthier choices when you eat out. No fries! o Pick baked chicken, NOT fried. o Don't forget to SLOW DOWN when you eat. It is not going anywhere.  o Take the stairs. o Park far away in the parking lot o [employer] (or weights) for 10 minutes while watching TV. o Walk at work for 10 minutes during break. o Walk outside 1 time a week with your friend, kids, dog, or significant other. o Start a walking group at [place] the mall as much as you can tolerate.  o Keep a food diary. o Weigh yourself daily. o Walk for 15 minutes 3 days per week. o Cook at home more often and eat out less.  If life happens and you go back to old habits, it is okay.  Just start over. You can do it!   If you experience chest pain, get short of breath, or tired during the exercise, please stop immediately and inform your doctor.   Ways to cut 100 calories  1. Eat your eggs with hot sauce OR salsa instead of cheese.  Eggs are great for  breakfast, but many people consider eggs and cheese to be BFFs. Instead of cheese-1 oz. of cheddar has 114 calories-top your eggs with hot sauce, which contains no calories and helps with satiety and metabolism. Salsa is also a great option!!  2. Top your toast, waffles or pancakes with mashed berries instead of jelly or syrup. Half a cup of berries-fresh, frozen or thawed-has about 40 calories, compared with 2 tbsp. of maple syrup or jelly, which both have about 100 calories. The berries will also give you a good punch of fiber, which helps keep you full and satisfied and won't spike blood sugar quickly like the jelly or syrup. 3. Swap the non-fat latte for black coffee with a splash of half-and-half. Contrary to its name, that non-fat latte has 130 calories and a startling 19g of carbohydrates per 16 oz. serving. Replacing that 'light' drinkable dessert with a black coffee with a splash of half-and-half saves you more than 100 calories per 16 oz. serving. 4. Sprinkle salads with freeze-dried raspberries instead of dried cranberries. If you want a sweet addition to your nutritious salad, stay away from dried cranberries. They have a whopping 130 calories per  cup and 30g carbohydrates. Instead, sprinkle  freeze-dried raspberries guilt-free and save more than 100 calories per  cup serving, adding 3g of belly-filling fiber. 5. Go for mustard in place of mayo on your sandwich. Mustard can add really nice flavor to any sandwich, and there are tons of varieties, from spicy to honey. A serving of mayo is 95 calories, versus 10 calories in a serving of mustard. 6. Choose a DIY salad dressing instead of the store-bought kind. Mix Dijon or whole grain mustard with low-fat Kefir or red wine vinegar and garlic. 7. Use hummus as a spread instead of a dip. Use hummus as a spread on a high-fiber cracker or tortilla with a sandwich and save on calories without sacrificing taste. 8. Pick just one salad  "accessory." Salad isn't automatically a calorie winner. It's easy to over-accessorize with toppings. Instead of topping your salad with nuts, avocado and cranberries (all three will clock in at 313 calories), just pick one. The next day, choose a different accessory, which will also keep your salad interesting. You don't wear all your jewelry every day, right? 9. Ditch the white pasta in favor of spaghetti squash. One cup of cooked spaghetti squash has about 40 calories, compared with traditional spaghetti, which comes with more than 200. Spaghetti squash is also nutrient-dense. It's a good source of fiber and Vitamins A and C, and it can be eaten just like you would eat pasta-with a great tomato sauce and [place] meatballs or with pesto, tofu and spinach, for example. 10. Dress up your chili, soups and stews with non-fat [place] yogurt instead of sour cream. Just a 'dollop' of sour cream can set you back 115 calories and a whopping 12g of fat-seven of which are of the artery-clogging variety. Added bonus: [place] yogurt is packed with muscle-building protein, calcium and B Vitamins. 11. Mash cauliflower instead of mashed potatoes. One cup of traditional mashed potatoes-in all their creamy goodness-has more than 200 calories, compared to mashed cauliflower, which you can typically eat for less than 100 calories per 1 cup serving. Cauliflower is a great source of the antioxidant indole-3-carbinol (I3C), which may help reduce the risk of some cancers, like breast cancer. 12. Ditch the ice cream sundae in favor of a [place] yogurt parfait. Instead of a cup of ice cream or fro-yo for dessert, try 1 cup of nonfat Greek yogurt topped with fresh berries and a sprinkle of cacao nibs. Both toppings are packed with antioxidants, which can help reduce cellular inflammation and oxidative damage. And the comparison is a no-brainer: One cup of ice cream has about 275 calories; one cup of frozen yogurt has about 230; and a cup  of Greek yogurt has just 130, plus twice the protein, so you're less likely to return to the freezer for a second helping. 13. Put olive oil in a spray container instead of using it directly from the bottle. Each tablespoon of olive oil is 120 calories and 15g of fat. Use a mister instead of pouring it straight into the pan or onto a salad. This allows for portion control and will save you more than 100 calories. 14. When baking, substitute canned pumpkin for butter or oil. Canned pumpkin-not pumpkin pie mix-is loaded with Vitamin A, which is important for skin and eye health, as well as immunity. And the comparisons are pretty crazy:  cup of canned pumpkin has about 40 calories, compared to butter or oil, which has more than 800 calories. Yes, 800 calories. Applesauce and mashed banana can also serve as  good substitutions for butter or oil, usually in a 1:1 ratio. 15. Top casseroles with high-fiber cereal instead of breadcrumbs. Breadcrumbs are typically made with white bread, while breakfast cereals contain 5-9g of fiber per serving. Not only will you save more than 150 calories per  cup serving, the swap will also keep you more full and you'll get a metabolism boost from the added fiber. 16. Snack on pistachios instead of macadamia nuts. Believe it or not, you get the same amount of calories from 35 pistachios (100 calories) as you would from only five macadamia nuts. 17. Chow down on kale chips rather than potato chips. This is my favorite 'don't knock it 'till you try it' swap. Kale chips are so easy to make at home, and you can spice them up with a little grated parmesan or chili powder. Plus, they're a mere fraction of the calories of potato chips, but with the same crunch factor we crave so often. 18. Add seltzer and some fruit slices to your cocktail instead of soda or fruit juice. One cup of soda or fruit juice can pack on as much as 140 calories. Instead, use seltzer and fruit slices. The  fruit provides valuable phytochemicals, such as flavonoids and anthocyanins, which help to combat cancer and stave off the aging process.  Remember exercise is great for your cardiovascular health and can help with weight loss but YOU CAN NOT OUT RUN YOUR FORK!

## 2015-08-31 LAB — TSH: TSH: 1.449 u[IU]/mL (ref 0.350–4.500)

## 2015-08-31 LAB — HEPATIC FUNCTION PANEL
ALBUMIN: 4.3 g/dL (ref 3.6–5.1)
ALT: 80 U/L — ABNORMAL HIGH (ref 6–29)
AST: 78 U/L — ABNORMAL HIGH (ref 10–35)
Alkaline Phosphatase: 80 U/L (ref 33–130)
BILIRUBIN DIRECT: 0.1 mg/dL (ref <=0.2)
Indirect Bilirubin: 0.3 mg/dL (ref 0.2–1.2)
Total Bilirubin: 0.4 mg/dL (ref 0.2–1.2)
Total Protein: 6.9 g/dL (ref 6.1–8.1)

## 2015-08-31 LAB — BASIC METABOLIC PANEL WITH GFR
BUN: 12 mg/dL (ref 7–25)
CO2: 24 mmol/L (ref 20–31)
Calcium: 9.2 mg/dL (ref 8.6–10.4)
Chloride: 107 mmol/L (ref 98–110)
Creat: 0.53 mg/dL (ref 0.50–1.05)
GFR, Est Non African American: >89 mL/min (ref >=60)
Glucose, Bld: 82 mg/dL (ref 65–99)
POTASSIUM: 4.1 mmol/L (ref 3.5–5.3)
SODIUM: 142 mmol/L (ref 135–146)

## 2015-08-31 LAB — LIPID PANEL
CHOL/HDL RATIO: 2.3 ratio (ref <=5.0)
Cholesterol: 174 mg/dL (ref 125–200)
HDL: 77 mg/dL (ref >=46)
LDL Cholesterol: 85 mg/dL (ref <130)
TRIGLYCERIDES: 62 mg/dL (ref <150)
VLDL: 12 mg/dL (ref <30)

## 2015-08-31 LAB — VITAMIN D 25 HYDROXY (VIT D DEFICIENCY, FRACTURES): VIT D 25 HYDROXY: 74 ng/mL (ref 30–100)

## 2015-08-31 LAB — INSULIN, FASTING: Insulin fasting, serum: 5.1 u[IU]/mL (ref 2.0–19.6)

## 2015-08-31 LAB — HEMOGLOBIN A1C
Hgb A1c MFr Bld: 5.3 % (ref <5.7)
Mean Plasma Glucose: 105 mg/dL (ref <117)

## 2015-08-31 LAB — MAGNESIUM: Magnesium: 2.2 mg/dL (ref 1.5–2.5)

## 2015-11-04 ENCOUNTER — Other Ambulatory Visit: Payer: Self-pay | Admitting: Physician Assistant

## 2015-11-04 DIAGNOSIS — F988 Other specified behavioral and emotional disorders with onset usually occurring in childhood and adolescence: Secondary | ICD-10-CM

## 2015-11-04 MED ORDER — AMPHETAMINE-DEXTROAMPHET ER 20 MG PO CP24
20.0000 mg | ORAL_CAPSULE | ORAL | Status: DC
Start: 2015-11-04 — End: 2015-11-30

## 2015-11-29 ENCOUNTER — Ambulatory Visit (INDEPENDENT_AMBULATORY_CARE_PROVIDER_SITE_OTHER): Payer: 59 | Admitting: Internal Medicine

## 2015-11-29 ENCOUNTER — Encounter: Payer: Self-pay | Admitting: Internal Medicine

## 2015-11-29 VITALS — BP 142/90 | HR 84 | Temp 97.9°F | Resp 16 | Ht 61.0 in | Wt 154.8 lb

## 2015-11-29 DIAGNOSIS — E785 Hyperlipidemia, unspecified: Secondary | ICD-10-CM

## 2015-11-29 DIAGNOSIS — F988 Other specified behavioral and emotional disorders with onset usually occurring in childhood and adolescence: Secondary | ICD-10-CM

## 2015-11-29 DIAGNOSIS — Z0000 Encounter for general adult medical examination without abnormal findings: Secondary | ICD-10-CM | POA: Diagnosis not present

## 2015-11-29 DIAGNOSIS — E559 Vitamin D deficiency, unspecified: Secondary | ICD-10-CM

## 2015-11-29 DIAGNOSIS — K219 Gastro-esophageal reflux disease without esophagitis: Secondary | ICD-10-CM

## 2015-11-29 DIAGNOSIS — Z79899 Other long term (current) drug therapy: Secondary | ICD-10-CM

## 2015-11-29 DIAGNOSIS — Z1212 Encounter for screening for malignant neoplasm of rectum: Secondary | ICD-10-CM

## 2015-11-29 DIAGNOSIS — R5383 Other fatigue: Secondary | ICD-10-CM

## 2015-11-29 DIAGNOSIS — Z0001 Encounter for general adult medical examination with abnormal findings: Secondary | ICD-10-CM

## 2015-11-29 DIAGNOSIS — R7303 Prediabetes: Secondary | ICD-10-CM

## 2015-11-29 DIAGNOSIS — I10 Essential (primary) hypertension: Secondary | ICD-10-CM

## 2015-11-29 LAB — IRON AND TIBC
%SAT: 17 % (ref 11–50)
Iron: 84 ug/dL (ref 45–160)
TIBC: 496 ug/dL — AB (ref 250–450)
UIBC: 412 ug/dL — ABNORMAL HIGH (ref 125–400)

## 2015-11-29 LAB — BASIC METABOLIC PANEL WITH GFR
BUN: 12 mg/dL (ref 7–25)
CALCIUM: 9.5 mg/dL (ref 8.6–10.4)
CO2: 26 mmol/L (ref 20–31)
CREATININE: 0.62 mg/dL (ref 0.50–1.05)
Chloride: 103 mmol/L (ref 98–110)
GFR, Est African American: >89 mL/min (ref >=60)
GFR, Est Non African American: >89 mL/min (ref >=60)
Glucose, Bld: 90 mg/dL (ref 65–99)
Potassium: 4.9 mmol/L (ref 3.5–5.3)
SODIUM: 140 mmol/L (ref 135–146)

## 2015-11-29 LAB — CBC WITH DIFFERENTIAL/PLATELET
BASOS PCT: 0 % (ref 0–1)
Basophils Absolute: 0 10*3/uL (ref 0.0–0.1)
EOS ABS: 0.3 10*3/uL (ref 0.0–0.7)
Eosinophils Relative: 5 % (ref 0–5)
HCT: 43.4 % (ref 36.0–46.0)
Hemoglobin: 14.1 g/dL (ref 12.0–15.0)
Lymphocytes Relative: 27 % (ref 12–46)
Lymphs Abs: 1.6 10*3/uL (ref 0.7–4.0)
MCH: 28.6 pg (ref 26.0–34.0)
MCHC: 32.5 g/dL (ref 30.0–36.0)
MCV: 88 fL (ref 78.0–100.0)
MONO ABS: 0.6 10*3/uL (ref 0.1–1.0)
MONOS PCT: 10 % (ref 3–12)
MPV: 10.4 fL (ref 8.6–12.4)
NEUTROS PCT: 58 % (ref 43–77)
Neutro Abs: 3.5 10*3/uL (ref 1.7–7.7)
PLATELETS: 270 10*3/uL (ref 150–400)
RBC: 4.93 MIL/uL (ref 3.87–5.11)
RDW: 14.3 % (ref 11.5–15.5)
WBC: 6.1 10*3/uL (ref 4.0–10.5)

## 2015-11-29 LAB — HEPATIC FUNCTION PANEL
ALT: 64 U/L — ABNORMAL HIGH (ref 6–29)
AST: 62 U/L — ABNORMAL HIGH (ref 10–35)
Albumin: 4.4 g/dL (ref 3.6–5.1)
Alkaline Phosphatase: 84 U/L (ref 33–130)
BILIRUBIN DIRECT: 0.1 mg/dL (ref <=0.2)
BILIRUBIN TOTAL: 0.7 mg/dL (ref 0.2–1.2)
Indirect Bilirubin: 0.6 mg/dL (ref 0.2–1.2)
Total Protein: 7 g/dL (ref 6.1–8.1)

## 2015-11-29 LAB — LIPID PANEL
CHOL/HDL RATIO: 2.3 ratio (ref <=5.0)
CHOLESTEROL: 189 mg/dL (ref 125–200)
HDL: 82 mg/dL (ref >=46)
LDL Cholesterol: 93 mg/dL (ref <130)
Triglycerides: 69 mg/dL (ref <150)
VLDL: 14 mg/dL (ref <30)

## 2015-11-29 LAB — MAGNESIUM: MAGNESIUM: 2.1 mg/dL (ref 1.5–2.5)

## 2015-11-29 NOTE — Progress Notes (Signed)
Patient ID: Kizzy Olafson, female   DOB: Jul 08, 1961, 54 y.o.   MRN: [id number]  Annual Screening/Preventative Visit And Comprehensive Evaluation &  Examination  This very nice 54 y.o. MWF presents for presents for a Wellness/Preventative Visit & comprehensive evaluation and management of multiple medical co-morbidities.  Patient has been followed for labile HTN, Reactive Hypoglycemia, Hyperlipidemia and Vitamin D Deficiency. Patient also has hx/o DDD/ recurrent LBP and has seen Dr Mina Marble for EDSI's. Patient also has hx/o GERD and has tapered off of Protonix & is using Ranitidine 150 my with break thru dyspepsia.     Patient has hx/o labile HTN predates since 2000 and is followed expectantly. Patient's BP has been controlled at home and patient denies any cardiac symptoms as chest pain, palpitations, shortness of breath, dizziness or ankle swelling. Today's BP is borderline elevated at 142/90.     Patient's hyperlipidemia is controlled with diet and medications. Patient denies myalgias or other medication SE's. Last lipids were at goal with  Cholesterol 189; HDL 82; LDL 93; & Triglycerides 69 on 11/29/2015.   Patient has hx/o reactive hypoglycemia and patient denies recent  reactive hypoglycemic symptoms, visual blurring, diabetic polys, or paresthesias. Current A1c is  5.3% with normal insulin level.   Finally, patient has history of Vitamin D Deficiency of 27 in 2009 and current Vitamin D is  61 on 11/29/2015.  Medication Sig  . Arginine 500 MG CAPS Take 500 mg by mouth daily.  Marland Kitchen aspirin 81 MG tablet Take 81 mg by mouth daily.  Marland Kitchen b complex vitamins tablet Take 1 tablet by mouth daily.  . Cholecalciferol (VITAMIN D3) 50000 UNITS CAPS Take daily or as directed  . milk thistle 175 MG tablet Take 175 mg by mouth daily.  . Multiple Vitamin (MULTIVITAMIN) tablet Take 1 tablet by mouth daily.  . NON FORMULARY Deep Blue Doterra Oil  . NON FORMULARY DDR, cellular complex  . NON FORMULARY Slim and Sassy,  metabolic blend  . NON FORMULARY Zendocrine  . NON FORMULARY Mitro 2 Max  . NON FORMULARY XEO Mega, omega complex  . NON FORMULARY Tri Ease Seasonal Blend  . NON FORMULARY Bone Nutrient  . NON FORMULARY Keratin  . NON FORMULARY Collagen Forensic psychologist  . Omega-3 FISH OIL 1200 MG CAPS Take 1 capsule by mouth daily.  . pantoprazole  40 MG tablet Take 1 tablet (40 mg total) by mouth daily. - tapered off  . polycarbophil  625 MG tablet Take 625 mg by mouth daily.   No Known Allergies   Past Medical History  Diagnosis Date  . HTN (hypertension)   . Hypoglycemia   . Goiter   . Murmur   . Dyspnea   . Cholecystitis, acute with cholelithiasis 07/19/2013  . Seizure disorder, primary generalized ([place]) 07/19/2013    May be related to hypoglycemia  . Hypoglycemia 07/19/2013  . ADD (attention deficit disorder) 07/19/2013  . GERD (gastroesophageal reflux disease) 07/19/2013  . Chronic left shoulder pain 07/19/2013  . Arthritis   . Pre-diabetes   . Sleep apnea   . Seizures ([place]) 2013    grand mal due to hypoglycemia   Health Maintenance  Topic Date Due  . PAP SMEAR  10/02/1981  . COLONOSCOPY  10/02/2010  . INFLUENZA VACCINE  07/15/2015  . MAMMOGRAM  10/16/2016  . TETANUS/TDAP  11/07/2019  . Hepatitis C Screening  Completed  . HIV Screening  Completed   Immunization History  Administered Date(s) Administered  . Pneumococcal Polysaccharide-23 12/14/1998  .  Tdap 11/06/2009   Past Surgical History  Procedure Laterality Date  . Gastric bypass    . Abdominal hysterectomy    . Breast enhancement surgery    . Total shoulder replacement    . Stomach tuck    . Cholecystectomy N/A 07/20/2013    Procedure: LAPAROSCOPIC CHOLECYSTECTOMY WITH INTRAOPERATIVE CHOLANGIOGRAM;  Surgeon: Shann Medal, MD;  Location: [facility];  Service: General;  Laterality: N/A;  . Gastric bypass    . Gastric bypass  2000   Family History  Problem Relation Age of Onset  . Coronary artery disease Father     MI at age 59   . Heart disease Father   . Hypertension Father   . Diabetes Father   . Cancer Mother     skin  . Hypertension Mother   . Hyperlipidemia Mother   . Cancer Maternal Aunt     breast  . Cancer Paternal Aunt     breast  . Cancer Paternal Uncle     breast  . Cancer Maternal Aunt   . Cancer Cousin   . Colon cancer Neg Hx    Social History  Substance Use Topics  . Smoking status: Never Smoker   . Smokeless tobacco: Never Used  . Alcohol Use: 0.5 oz/week    1 Standard drinks or equivalent per week     Comment: Occasional    ROS Constitutional: Denies fever, chills, weight loss/gain, headaches, insomnia,  night sweats, and change in appetite. Does c/o fatigue. Eyes: Denies redness, blurred vision, diplopia, discharge, itchy, watery eyes.  ENT: Denies discharge, congestion, post nasal drip, epistaxis, sore throat, earache, hearing loss, dental pain, Tinnitus, Vertigo, Sinus pain, snoring.  Cardio: Denies chest pain, palpitations, irregular heartbeat, syncope, dyspnea, diaphoresis, orthopnea, PND, claudication, edema Respiratory: denies cough, dyspnea, DOE, pleurisy, hoarseness, laryngitis, wheezing.  Gastrointestinal: Denies dysphagia, heartburn, reflux, water brash, pain, cramps, nausea, vomiting, bloating, diarrhea, constipation, hematemesis, melena, hematochezia, jaundice, hemorrhoids Genitourinary: Denies dysuria, frequency, urgency, nocturia, hesitancy, discharge, hematuria, flank pain Breast: Breast lumps, nipple discharge, bleeding.  Musculoskeletal: Denies arthralgia, myalgia, stiffness, Jt. Swelling, pain, limp, and strain/sprain. Denies falls. Skin: Denies puritis, rash, hives, warts, acne, eczema, changing in skin lesion Neuro: No weakness, tremor, incoordination, spasms, paresthesia, pain Psychiatric: Denies confusion, memory loss, sensory loss. Denies Depression. Endocrine: Denies change in weight, skin, hair change, nocturia, and paresthesia, diabetic polys, visual  blurring, hyper / hypo glycemic episodes.  Heme/Lymph: No excessive bleeding, bruising, enlarged lymph nodes.  Physical Exam  BP 142/90 mmHg  Pulse 84  Temp(Src) 97.9 F (36.6 C)  Resp 16  Ht 5' 1"  (1.549 m)  Wt 154 lb 12.8 oz (70.217 kg)  BMI 29.26 kg/m2  General Appearance: Well nourished and in no apparent distress. Eyes: PERRLA, EOMs, conjunctiva no swelling or erythema, normal fundi and vessels. Sinuses: No frontal/maxillary tenderness ENT/Mouth: EACs patent / TMs  nl. Nares clear without erythema, swelling, mucoid exudates. Oral hygiene is good. No erythema, swelling, or exudate. Tongue normal, non-obstructing. Tonsils not swollen or erythematous. Hearing normal.  Neck: Supple, thyroid normal. No bruits, nodes or JVD. Respiratory: Respiratory effort normal.  BS equal and clear bilateral without rales, rhonci, wheezing or stridor. Cardio: Heart sounds are normal with regular rate and rhythm and no murmurs, rubs or gallops. Peripheral pulses are normal and equal bilaterally without edema. No aortic or femoral bruits. Chest: symmetric with normal excursions and percussion. Breasts: Symmetric, without lumps, nipple discharge, retractions, or fibrocystic changes.  Abdomen: Flat, soft, with bowl sounds. Nontender,  no guarding, rebound, hernias, masses, or organomegaly.  Lymphatics: Non tender without lymphadenopathy.  Musculoskeletal: Full ROM all peripheral extremities, joint stability, 5/5 strength, and normal gait. Skin: Warm and dry without rashes, lesions, cyanosis, clubbing or  ecchymosis.  Neuro: Cranial nerves intact, reflexes equal bilaterally. Normal muscle tone, no cerebellar symptoms. Sensation intact.  Pysch: Alert and oriented X 3, normal affect, Insight and Judgment appropriate.   Assessment and Plan  1. Annual Preventative Screening Examination   2. Essential hypertension  - Microalbumin / creatinine urine ratio - EKG 12-Lead - TSH  3. Hyperlipidemia  -  Lipid panel - TSH  4. Prediabetes  - Hemoglobin A1c - Insulin, random  5. Vitamin D deficiency  - VITAMIN D 25 Hydroxy   6. Gastroesophageal reflux disease  -RX Ranitidine 300 mg # 180 - 1 bid prn  7. Screening for rectal cancer  - POC Hemoccult Bld/Stl   8. Other fatigue  - Vitamin B12 - Iron and TIBC  9. Medication management  - Urinalysis, Routine w reflex microscopic  - CBC with Differential/Platelet - BASIC METABOLIC PANEL WITH GFR - Hepatic function panel - Magnesium  10. ADD (attention deficit disorder)  - amphetamine-dextroamphetamine (ADDERALL XR) 20 MG 24 hr capsule; Take 1 capsule (20 mg total) by mouth every morning.  Dispense: 30 capsule; Refill: 0   Continue prudent diet as discussed, weight control, BP monitoring, regular exercise, and medications. Discussed med's effects and SE's. Screening labs and tests as requested with regular follow-up as recommended. Over 40 minutes of exam, counseling, chart review and high complex critical decision making was performed.

## 2015-11-29 NOTE — Patient Instructions (Signed)
Recommend Adult Low Dose Aspirin or   coated  Aspirin 81 mg daily   To reduce risk of Colon Cancer 20 %,   Skin Cancer 26 % ,   Melanoma 46%   and   Pancreatic cancer 60%   ++++++++++++++++++++++++++++++++++++++++++++++++++++++  Vitamin D goal   is between 70-100.   Please make sure that you are taking your Vitamin D as directed.   It is very important as a natural anti-inflammatory   helping hair, skin, and nails, as well as reducing stroke and heart attack risk.   It helps your bones and helps with mood.  It also decreases numerous cancer risks so please take it as directed.   Low Vit D is associated with a 200-300% higher risk for CANCER   and 200-300% higher risk for HEART   ATTACK  &  STROKE.   ......................................  It is also associated with higher death rate at younger ages,   autoimmune diseases like Rheumatoid arthritis, Lupus, Multiple Sclerosis.     Also many other serious conditions, like depression, Alzheimer's  Dementia, infertility, muscle aches, fatigue, fibromyalgia - just to name a few.  ++++++++++++++++++++++++++++++++++++++++++++++++  Recommend the book "The END of DIETING" by Dr Joel Fuhrman   & the book "The END of DIABETES " by Dr Joel Fuhrman  At [employer] - get book & Audio CD's     Being diabetic has a  300% increased risk for heart attack, stroke, cancer, and alzheimer- type vascular dementia. It is very important that you work harder with diet by avoiding all foods that are white. Avoid white rice (brown & wild rice is OK), white potatoes (sweetpotatoes in moderation is OK), White bread or wheat bread or anything made out of white flour like bagels, donuts, rolls, buns, biscuits, cakes, pastries, cookies, pizza crust, and pasta (made from white flour & egg whites) - vegetarian pasta or spinach or wheat pasta is OK. Multigrain breads like Arnold's or [employer], or multigrain sandwich thins or flatbreads.  Diet,  exercise and weight loss can reverse and cure diabetes in the early stages.  Diet, exercise and weight loss is very important in the control and prevention of complications of diabetes which affects every system in your body, ie. Brain - dementia/stroke, eyes - glaucoma/blindness, heart - heart attack/heart failure, kidneys - dialysis, stomach - gastric paralysis, intestines - malabsorption, nerves - severe painful neuritis, circulation - gangrene & loss of a leg(s), and finally cancer and Alzheimers.    I recommend avoid fried & greasy foods,  sweets/candy, white rice (brown or wild rice or Quinoa is OK), white potatoes (sweet potatoes are OK) - anything made from white flour - bagels, doughnuts, rolls, buns, biscuits,white and wheat breads, pizza crust and traditional pasta made of white flour & egg white(vegetarian pasta or spinach or wheat pasta is OK).  Multi-grain bread is OK - like multi-grain flat bread or sandwich thins. Avoid alcohol in excess. Exercise is also important.    Eat all the vegetables you want - avoid meat, especially red meat and dairy - especially cheese.  Cheese is the most concentrated form of trans-fats which is the worst thing to clog up our arteries. Veggie cheese is OK which can be found in the fresh produce section at [employer] or [employer] or [employer]  ++++++++++++++++++++++++++++++++++++++++++++++++++ DASH Eating Plan  DASH stands for "Dietary Approaches to Stop Hypertension."   The DASH eating plan is a healthy eating plan that has been shown to reduce high   blood pressure (hypertension). Additional health benefits may include reducing the risk of type 2 diabetes mellitus, heart disease, and stroke. The DASH eating plan may also help with weight loss.  WHAT DO I NEED TO KNOW ABOUT THE DASH EATING PLAN? For the DASH eating plan, you will follow these general guidelines:  Choose foods with a percent daily value for sodium of less than 5% (as listed on the food  label).  Use salt-free seasonings or herbs instead of table salt or sea salt.  Check with your health care provider or pharmacist before using salt substitutes.  Eat lower-sodium products, often labeled as "lower sodium" or "no salt added."  Eat fresh foods.  Eat more vegetables, fruits, and low-fat dairy products.    Choose whole grains. Look for the word "whole" as the first word in the ingredient list.  Choose fish   Limit sweets, desserts, sugars, and sugary drinks.  Choose heart-healthy fats.  Eat veggie cheese   Eat more home-cooked food and less restaurant, buffet, and fast food.  Limit fried foods.  Cook foods using methods other than frying.  Limit canned vegetables. If you do use them, rinse them well to decrease the sodium.  When eating at a restaurant, ask that your food be prepared with less salt, or no salt if possible.                      WHAT FOODS CAN I EAT?  Seek help from a dietitian for individual calorie needs.  Grains Whole grain or whole wheat bread. Brown rice. Whole grain or whole wheat pasta. Quinoa, bulgur, and whole grain cereals. Low-sodium cereals. Corn or whole wheat flour tortillas. Whole grain cornbread. Whole grain crackers. Low-sodium crackers.  Vegetables Fresh or frozen vegetables (raw, steamed, roasted, or grilled). Low-sodium or reduced-sodium tomato and vegetable juices. Low-sodium or reduced-sodium tomato sauce and paste. Low-sodium or reduced-sodium canned vegetables.   Fruits All fresh, canned (in natural juice), or frozen fruits.  Protein Products  All fish and seafood.  Dried beans, peas, or lentils. Unsalted nuts and seeds. Unsalted canned beans.  Dairy Low-fat dairy products, such as skim or 1% milk, 2% or reduced-fat cheeses, low-fat ricotta or cottage cheese, or plain low-fat yogurt. Low-sodium or reduced-sodium cheeses.  Fats and Oils Tub margarines without trans fats. Light or reduced-fat mayonnaise and salad  dressings (reduced sodium). Avocado. Safflower, olive, or canola oils. Natural peanut or almond butter.  Other Unsalted popcorn and pretzels. The items listed above may not be a complete list of recommended foods or beverages. Contact your dietitian for more options.  +++++++++++++++++++++++++++++++++++++++++++  WHAT FOODS ARE NOT RECOMMENDED?  Grains/ White flour or wheat flour  White bread. White pasta. White rice. Refined cornbread. Bagels and croissants. Crackers that contain trans fat.  Vegetables  Creamed or fried vegetables. Vegetables in a . Regular canned vegetables. Regular canned tomato sauce and paste. Regular tomato and vegetable juices.  Fruits Dried fruits. Canned fruit in light or heavy syrup. Fruit juice.  Meat and Other Protein Products Meat in general. Fatty cuts of meat. Ribs, chicken wings, bacon, sausage, bologna, salami, chitterlings, fatback, hot dogs, bratwurst, and packaged luncheon meats.  Dairy Whole or 2% milk, cream, half-and-half, and cream cheese. Whole-fat or sweetened yogurt. Full-fat cheeses or blue cheese. Nondairy creamers and whipped toppings. Processed cheese, cheese spreads, or cheese curds.  Condiments Onion and garlic salt, seasoned salt, table salt, and sea salt. Canned and packaged gravies. Worcestershire sauce. Tartar sauce.  Barbecue sauce. Teriyaki sauce. Soy sauce, including reduced sodium. Steak sauce. Fish sauce. Oyster sauce. Cocktail sauce. Horseradish. Ketchup and mustard. Meat flavorings and tenderizers. Bouillon cubes. Hot sauce. Tabasco sauce. Marinades. Taco seasonings. Relishes.  Fats and Oils Butter, stick margarine, lard, shortening, ghee, and bacon fat. Coconut, palm kernel, or palm oils. Regular salad dressings.  Pickles and olives. Salted popcorn and pretzels. The items listed above may not be a complete list of foods and beverages to avoid.   Preventive Care for Adults  A healthy lifestyle and preventive care can  promote health and wellness. Preventive health guidelines for women include the following key practices.  A routine yearly physical is a good way to check with your health care provider about your health and preventive screening. It is a chance to share any concerns and updates on your health and to receive a thorough exam.  Visit your dentist for a routine exam and preventive care every 6 months. Brush your teeth twice a day and floss once a day. Good oral hygiene prevents tooth decay and gum disease.  The frequency of eye exams is based on your age, health, family medical history, use of contact lenses, and other factors. Follow your health care provider's recommendations for frequency of eye exams.  Eat a healthy diet. Foods like vegetables, fruits, whole grains, low-fat dairy products, and lean protein foods contain the nutrients you need without too many calories. Decrease your intake of foods high in solid fats, added sugars, and salt. Eat the right amount of calories for you.Get information about a proper diet from your health care provider, if necessary.  Regular physical exercise is one of the most important things you can do for your health. Most adults should get at least 150 minutes of moderate-intensity exercise (any activity that increases your heart rate and causes you to sweat) each week. In addition, most adults need muscle-strengthening exercises on 2 or more days a week.  Maintain a healthy weight. The body mass index (BMI) is a screening tool to identify possible weight problems. It provides an estimate of body fat based on height and weight. Your health care provider can find your BMI and can help you achieve or maintain a healthy weight.For adults 20 years and older:  A BMI below 18.5 is considered underweight.  A BMI of 18.5 to 24.9 is normal.  A BMI of 25 to 29.9 is considered overweight.  A BMI of 30 and above is considered obese.  Maintain normal blood lipids and  cholesterol levels by exercising and minimizing your intake of saturated fat. Eat a balanced diet with plenty of fruit and vegetables. Blood tests for lipids and cholesterol should begin at age 31 and be repeated every 5 years. If your lipid or cholesterol levels are high, you are over 50, or you are at high risk for heart disease, you may need your cholesterol levels checked more frequently.Ongoing high lipid and cholesterol levels should be treated with medicines if diet and exercise are not working.  If you smoke, find out from your health care provider how to quit. If you do not use tobacco, do not start.  Lung cancer screening is recommended for adults aged 83-80 years who are at high risk for developing lung cancer because of a history of smoking. A yearly low-dose CT scan of the lungs is recommended for people who have at least a 30-pack-year history of smoking and are a current smoker or have quit within the past 15 years.  A pack year of smoking is smoking an average of 1 pack of cigarettes a day for 1 year (for example: 1 pack a day for 30 years or 2 packs a day for 15 years). Yearly screening should continue until the smoker has stopped smoking for at least 15 years. Yearly screening should be stopped for people who develop a health problem that would prevent them from having lung cancer treatment.  High blood pressure causes heart disease and increases the risk of stroke. Your blood pressure should be checked at least every 1 to 2 years. Ongoing high blood pressure should be treated with medicines if weight loss and exercise do not work.  If you are 61-30 years old, ask your health care provider if you should take aspirin to prevent strokes.  Diabetes screening involves taking a blood sample to check your fasting blood sugar level. This should be done once every 3 years, after age 9, if you are within normal weight and without risk factors for diabetes. Testing should be considered at a  younger age or be carried out more frequently if you are overweight and have at least 1 risk factor for diabetes.  Breast cancer screening is essential preventive care for women. You should practice "breast self-awareness." This means understanding the normal appearance and feel of your breasts and may include breast self-examination. Any changes detected, no matter how small, should be reported to a health care provider. Women in their 74s and 30s should have a clinical breast exam (CBE) by a health care provider as part of a regular health exam every 1 to 3 years. After age 75, women should have a CBE every year. Starting at age 19, women should consider having a mammogram (breast X-ray test) every year. Women who have a family history of breast cancer should talk to their health care provider about genetic screening. Women at a high risk of breast cancer should talk to their health care providers about having an MRI and a mammogram every year.  Breast cancer gene (BRCA)-related cancer risk assessment is recommended for women who have family members with BRCA-related cancers. BRCA-related cancers include breast, ovarian, tubal, and peritoneal cancers. Having family members with these cancers may be associated with an increased risk for harmful changes (mutations) in the breast cancer genes BRCA1 and BRCA2. Results of the assessment will determine the need for genetic counseling and BRCA1 and BRCA2 testing.  Routine pelvic exams to screen for cancer are no longer recommended for nonpregnant women who are considered low risk for cancer of the pelvic organs (ovaries, uterus, and vagina) and who do not have symptoms. Ask your health care provider if a screening pelvic exam is right for you.  If you have had past treatment for cervical cancer or a condition that could lead to cancer, you need Pap tests and screening for cancer for at least 20 years after your treatment. If Pap tests have been discontinued, your  risk factors (such as having a new sexual partner) need to be reassessed to determine if screening should be resumed. Some women have medical problems that increase the chance of getting cervical cancer. In these cases, your health care provider may recommend more frequent screening and Pap tests.  Colorectal cancer can be detected and often prevented. Most routine colorectal cancer screening begins at the age of 23 years and continues through age 42 years. However, your health care provider may recommend screening at an earlier age if you have risk factors for colon  cancer. On a yearly basis, your health care provider may provide home test kits to check for hidden blood in the stool. Use of a small camera at the end of a tube, to directly examine the colon (sigmoidoscopy or colonoscopy), can detect the earliest forms of colorectal cancer. Talk to your health care provider about this at age 50, when routine screening begins. Direct exam of the colon should be repeated every 5-10 years through age 75 years, unless early forms of pre-cancerous polyps or small growths are found.  Hepatitis C blood testing is recommended for all people born from 1945 through 1965 and any individual with known risks for hepatitis C.  Pra  Osteoporosis is a disease in which the bones lose minerals and strength with aging. This can result in serious bone fractures or breaks. The risk of osteoporosis can be identified using a bone density scan. Women ages 65 years and over and women at risk for fractures or osteoporosis should discuss screening with their health care providers. Ask your health care provider whether you should take a calcium supplement or vitamin D to reduce the rate of osteoporosis.  Menopause can be associated with physical symptoms and risks. Hormone replacement therapy is available to decrease symptoms and risks. You should talk to your health care provider about whether hormone replacement therapy is right  for you.  Use sunscreen. Apply sunscreen liberally and repeatedly throughout the day. You should seek shade when your shadow is shorter than you. Protect yourself by wearing long sleeves, pants, a wide-brimmed hat, and sunglasses year round, whenever you are outdoors.  Once a month, do a whole body skin exam, using a mirror to look at the skin on your back. Tell your health care provider of new moles, moles that have irregular borders, moles that are larger than a pencil eraser, or moles that have changed in shape or color.  Stay current with required vaccines (immunizations).  Influenza vaccine. All adults should be immunized every year.  Tetanus, diphtheria, and acellular pertussis (Td, Tdap) vaccine. Pregnant women should receive 1 dose of Tdap vaccine during each pregnancy. The dose should be obtained regardless of the length of time since the last dose. Immunization is preferred during the 27th-36th week of gestation. An adult who has not previously received Tdap or who does not know her vaccine status should receive 1 dose of Tdap. This initial dose should be followed by tetanus and diphtheria toxoids (Td) booster doses every 10 years. Adults with an unknown or incomplete history of completing a 3-dose immunization series with Td-containing vaccines should begin or complete a primary immunization series including a Tdap dose. Adults should receive a Td booster every 10 years.  Varicella vaccine. An adult without evidence of immunity to varicella should receive 2 doses or a second dose if she has previously received 1 dose. Pregnant females who do not have evidence of immunity should receive the first dose after pregnancy. This first dose should be obtained before leaving the health care facility. The second dose should be obtained 4-8 weeks after the first dose.  Human papillomavirus (HPV) vaccine. Females aged 13-26 years who have not received the vaccine previously should obtain the 3-dose  series. The vaccine is not recommended for use in pregnant females. However, pregnancy testing is not needed before receiving a dose. If a female is found to be pregnant after receiving a dose, no treatment is needed. In that case, the remaining doses should be delayed until after the pregnancy. Immunization   is recommended for any person with an immunocompromised condition through the age of 26 years if she did not get any or all doses earlier. During the 3-dose series, the second dose should be obtained 4-8 weeks after the first dose. The third dose should be obtained 24 weeks after the first dose and 16 weeks after the second dose.  Zoster vaccine. One dose is recommended for adults aged 60 years or older unless certain conditions are present.  Measles, mumps, and rubella (MMR) vaccine. Adults born before 1957 generally are considered immune to measles and mumps. Adults born in 1957 or later should have 1 or more doses of MMR vaccine unless there is a contraindication to the vaccine or there is laboratory evidence of immunity to each of the three diseases. A routine second dose of MMR vaccine should be obtained at least 28 days after the first dose for students attending postsecondary schools, health care workers, or international travelers. People who received inactivated measles vaccine or an unknown type of measles vaccine during 1963-1967 should receive 2 doses of MMR vaccine. People who received inactivated mumps vaccine or an unknown type of mumps vaccine before 1979 and are at high risk for mumps infection should consider immunization with 2 doses of MMR vaccine. For females of childbearing age, rubella immunity should be determined. If there is no evidence of immunity, females who are not pregnant should be vaccinated. If there is no evidence of immunity, females who are pregnant should delay immunization until after pregnancy. Unvaccinated health care workers born before 1957 who lack laboratory  evidence of measles, mumps, or rubella immunity or laboratory confirmation of disease should consider measles and mumps immunization with 2 doses of MMR vaccine or rubella immunization with 1 dose of MMR vaccine.  Pneumococcal 13-valent conjugate (PCV13) vaccine. When indicated, a person who is uncertain of her immunization history and has no record of immunization should receive the PCV13 vaccine. An adult aged 19 years or older who has certain medical conditions and has not been previously immunized should receive 1 dose of PCV13 vaccine. This PCV13 should be followed with a dose of pneumococcal polysaccharide (PPSV23) vaccine. The PPSV23 vaccine dose should be obtained at least 8 weeks after the dose of PCV13 vaccine. An adult aged 19 years or older who has certain medical conditions and previously received 1 or more doses of PPSV23 vaccine should receive 1 dose of PCV13. The PCV13 vaccine dose should be obtained 1 or more years after the last PPSV23 vaccine dose.    Pneumococcal polysaccharide (PPSV23) vaccine. When PCV13 is also indicated, PCV13 should be obtained first. All adults aged 65 years and older should be immunized. An adult younger than age 65 years who has certain medical conditions should be immunized. Any person who resides in a nursing home or long-term care facility should be immunized. An adult smoker should be immunized. People with an immunocompromised condition and certain other conditions should receive both PCV13 and PPSV23 vaccines. People with human immunodeficiency virus (HIV) infection should be immunized as soon as possible after diagnosis. Immunization during chemotherapy or radiation therapy should be avoided. Routine use of PPSV23 vaccine is not recommended for American Indians, [place] Natives, or people younger than 65 years unless there are medical conditions that require PPSV23 vaccine. When indicated, people who have unknown immunization and have no record of immunization  should receive PPSV23 vaccine. One-time revaccination 5 years after the first dose of PPSV23 is recommended for people aged 19-64 years who have   chronic kidney failure, nephrotic syndrome, asplenia, or immunocompromised conditions. People who received 1-2 doses of PPSV23 before age 28 years should receive another dose of PPSV23 vaccine at age 64 years or later if at least 5 years have passed since the previous dose. Doses of PPSV23 are not needed for people immunized with PPSV23 at or after age 65 years.  Preventive Services / Frequency   Ages 52 to 76 years  Blood pressure check.  Lipid and cholesterol check.  Lung cancer screening. / Every year if you are aged 62-80 years and have a 30-pack-year history of smoking and currently smoke or have quit within the past 15 years. Yearly screening is stopped once you have quit smoking for at least 15 years or develop a health problem that would prevent you from having lung cancer treatment.  Clinical breast exam.** / Every year after age 38 years.  BRCA-related cancer risk assessment.** / For women who have family members with a BRCA-related cancer (breast, ovarian, tubal, or peritoneal cancers).  Mammogram.** / Every year beginning at age 69 years and continuing for as long as you are in good health. Consult with your health care provider.  Pap test.** / Every 3 years starting at age 29 years through age 47 or 54 years with a history of 3 consecutive normal Pap tests.  HPV screening.** / Every 3 years from ages 1 years through ages 108 to 42 years with a history of 3 consecutive normal Pap tests.  Fecal occult blood test (FOBT) of stool. / Every year beginning at age 36 years and continuing until age 71 years. You may not need to do this test if you get a colonoscopy every 10 years.  Flexible sigmoidoscopy or colonoscopy.** / Every 5 years for a flexible sigmoidoscopy or every 10 years for a colonoscopy beginning at age 65 years and continuing  until age 21 years.  Hepatitis C blood test.** / For all people born from [birth year] through 1965 and any individual with known risks for hepatitis C.  Skin self-exam. / Monthly.  Influenza vaccine. / Every year.  Tetanus, diphtheria, and acellular pertussis (Tdap/Td) vaccine.** / Consult your health care provider. Pregnant women should receive 1 dose of Tdap vaccine during each pregnancy. 1 dose of Td every 10 years.  Varicella vaccine.** / Consult your health care provider. Pregnant females who do not have evidence of immunity should receive the first dose after pregnancy.  Zoster vaccine.** / 1 dose for adults aged 69 years or older.  Pneumococcal 13-valent conjugate (PCV13) vaccine.** / Consult your health care provider.  Pneumococcal polysaccharide (PPSV23) vaccine.** / 1 to 2 doses if you smoke cigarettes or if you have certain conditions.  Meningococcal vaccine.** / Consult your health care provider.  Hepatitis A vaccine.** / Consult your health care provider.  Hepatitis B vaccine.** / Consult your health care provider. Screening for abdominal aortic aneurysm (AAA)  by ultrasound is recommended for people over 50 who have history of high blood pressure or who are current or former smokers.

## 2015-11-30 ENCOUNTER — Encounter: Payer: Self-pay | Admitting: Internal Medicine

## 2015-11-30 LAB — MICROALBUMIN / CREATININE URINE RATIO
CREATININE, URINE: 222 mg/dL (ref 20–320)
MICROALB UR: 1.3 mg/dL
Microalb Creat Ratio: 6 mcg/mg creat (ref <30)

## 2015-11-30 LAB — URINALYSIS, ROUTINE W REFLEX MICROSCOPIC
Bilirubin Urine: NEGATIVE
Hgb urine dipstick: NEGATIVE
Leukocytes, UA: NEGATIVE
Nitrite: NEGATIVE
Protein, ur: NEGATIVE
Specific Gravity, Urine: 1.029 (ref 1.001–1.035)
pH: 6 (ref 5.0–8.0)

## 2015-11-30 LAB — URINALYSIS, MICROSCOPIC ONLY
BACTERIA UA: NONE SEEN [HPF]
CASTS: NONE SEEN [LPF]
CRYSTALS: NONE SEEN [HPF]
RBC / HPF: NONE SEEN RBC/HPF (ref <=2)
WBC, UA: NONE SEEN WBC/HPF (ref <=5)
YEAST: NONE SEEN [HPF]

## 2015-11-30 LAB — INSULIN, RANDOM: INSULIN: 5.8 u[IU]/mL (ref 2.0–19.6)

## 2015-11-30 LAB — TSH: TSH: 2.637 u[IU]/mL (ref 0.350–4.500)

## 2015-11-30 LAB — HEMOGLOBIN A1C
HEMOGLOBIN A1C: 5.3 % (ref <5.7)
Mean Plasma Glucose: 105 mg/dL (ref <117)

## 2015-11-30 LAB — VITAMIN D 25 HYDROXY (VIT D DEFICIENCY, FRACTURES): VIT D 25 HYDROXY: 61 ng/mL (ref 30–100)

## 2015-11-30 LAB — VITAMIN B12: VITAMIN B 12: 519 pg/mL (ref 211–911)

## 2015-11-30 MED ORDER — RANITIDINE HCL 300 MG PO TABS: ORAL_TABLET | ORAL | Status: DC

## 2015-11-30 MED ORDER — AMPHETAMINE-DEXTROAMPHET ER 20 MG PO CP24: 20.0000 mg | ORAL_CAPSULE | ORAL | Status: DC

## 2015-12-01 NOTE — Addendum Note (Signed)
Addended by: Quadasia Newsham A on: 12/01/2015 05:25 PM   Modules accepted: Orders

## 2015-12-04 ENCOUNTER — Encounter: Payer: Self-pay | Admitting: Internal Medicine

## 2016-01-20 ENCOUNTER — Encounter: Payer: Self-pay | Admitting: Gastroenterology

## 2016-03-26 ENCOUNTER — Other Ambulatory Visit: Payer: Self-pay

## 2016-03-26 ENCOUNTER — Other Ambulatory Visit: Payer: Self-pay | Admitting: Physician Assistant

## 2016-03-26 DIAGNOSIS — F988 Other specified behavioral and emotional disorders with onset usually occurring in childhood and adolescence: Secondary | ICD-10-CM

## 2016-03-26 DIAGNOSIS — Z0001 Encounter for general adult medical examination with abnormal findings: Secondary | ICD-10-CM

## 2016-03-26 DIAGNOSIS — E559 Vitamin D deficiency, unspecified: Secondary | ICD-10-CM

## 2016-03-26 MED ORDER — PANTOPRAZOLE SODIUM 40 MG PO TBEC: 40.0000 mg | DELAYED_RELEASE_TABLET | Freq: Every day | ORAL | Status: DC

## 2016-03-26 MED ORDER — AMPHETAMINE-DEXTROAMPHET ER 20 MG PO CP24: 20.0000 mg | ORAL_CAPSULE | ORAL | Status: DC

## 2016-03-26 MED ORDER — VITAMIN D3 1.25 MG (50000 UT) PO CAPS: ORAL_CAPSULE | ORAL | Status: DC

## 2016-05-20 DIAGNOSIS — H1013 Acute atopic conjunctivitis, bilateral: Secondary | ICD-10-CM | POA: Diagnosis not present

## 2016-05-29 ENCOUNTER — Encounter: Payer: Self-pay | Admitting: Physician Assistant

## 2016-05-29 ENCOUNTER — Ambulatory Visit (INDEPENDENT_AMBULATORY_CARE_PROVIDER_SITE_OTHER): Payer: BLUE CROSS/BLUE SHIELD | Admitting: Physician Assistant

## 2016-05-29 VITALS — BP 124/70 | HR 84 | Temp 98.1°F | Resp 16 | Ht 61.0 in | Wt 156.2 lb

## 2016-05-29 DIAGNOSIS — F988 Other specified behavioral and emotional disorders with onset usually occurring in childhood and adolescence: Secondary | ICD-10-CM

## 2016-05-29 DIAGNOSIS — Z0001 Encounter for general adult medical examination with abnormal findings: Secondary | ICD-10-CM

## 2016-05-29 DIAGNOSIS — I10 Essential (primary) hypertension: Secondary | ICD-10-CM | POA: Diagnosis not present

## 2016-05-29 DIAGNOSIS — R7303 Prediabetes: Secondary | ICD-10-CM | POA: Diagnosis not present

## 2016-05-29 DIAGNOSIS — Z79899 Other long term (current) drug therapy: Secondary | ICD-10-CM | POA: Diagnosis not present

## 2016-05-29 DIAGNOSIS — E559 Vitamin D deficiency, unspecified: Secondary | ICD-10-CM

## 2016-05-29 DIAGNOSIS — F909 Attention-deficit hyperactivity disorder, unspecified type: Secondary | ICD-10-CM

## 2016-05-29 DIAGNOSIS — E785 Hyperlipidemia, unspecified: Secondary | ICD-10-CM

## 2016-05-29 DIAGNOSIS — K7581 Nonalcoholic steatohepatitis (NASH): Secondary | ICD-10-CM | POA: Diagnosis not present

## 2016-05-29 LAB — HEPATIC FUNCTION PANEL
ALT: 27 U/L (ref 6–29)
AST: 27 U/L (ref 10–35)
Albumin: 4.1 g/dL (ref 3.6–5.1)
Alkaline Phosphatase: 66 U/L (ref 33–130)
BILIRUBIN DIRECT: 0.1 mg/dL (ref <=0.2)
BILIRUBIN INDIRECT: 0.4 mg/dL (ref 0.2–1.2)
BILIRUBIN TOTAL: 0.5 mg/dL (ref 0.2–1.2)
Total Protein: 6.6 g/dL (ref 6.1–8.1)

## 2016-05-29 LAB — CBC WITH DIFFERENTIAL/PLATELET
BASOS ABS: 0 {cells}/uL (ref 0–200)
BASOS PCT: 0 %
EOS ABS: 204 {cells}/uL (ref 15–500)
Eosinophils Relative: 4 %
HEMATOCRIT: 41.1 % (ref 35.0–45.0)
Hemoglobin: 13.1 g/dL (ref 11.7–15.5)
LYMPHS PCT: 30 %
Lymphs Abs: 1530 cells/uL (ref 850–3900)
MCH: 28.2 pg (ref 27.0–33.0)
MCHC: 31.9 g/dL — ABNORMAL LOW (ref 32.0–36.0)
MCV: 88.6 fL (ref 80.0–100.0)
MONO ABS: 561 {cells}/uL (ref 200–950)
MPV: 9.9 fL (ref 7.5–12.5)
Monocytes Relative: 11 %
NEUTROS ABS: 2805 {cells}/uL (ref 1500–7800)
Neutrophils Relative %: 55 %
Platelets: 223 10*3/uL (ref 140–400)
RBC: 4.64 MIL/uL (ref 3.80–5.10)
RDW: 14.4 % (ref 11.0–15.0)
WBC: 5.1 10*3/uL (ref 3.8–10.8)

## 2016-05-29 LAB — LIPID PANEL
CHOL/HDL RATIO: 2.7 ratio (ref <=5.0)
Cholesterol: 161 mg/dL (ref 125–200)
HDL: 59 mg/dL (ref >=46)
LDL Cholesterol: 88 mg/dL (ref <130)
Triglycerides: 69 mg/dL (ref <150)
VLDL: 14 mg/dL (ref <30)

## 2016-05-29 LAB — TSH: TSH: 2.37 m[IU]/L

## 2016-05-29 LAB — MAGNESIUM: MAGNESIUM: 2.3 mg/dL (ref 1.5–2.5)

## 2016-05-29 MED ORDER — RANITIDINE HCL 300 MG PO TABS: ORAL_TABLET | ORAL | Status: DC

## 2016-05-29 MED ORDER — AMPHETAMINE-DEXTROAMPHET ER 20 MG PO CP24: 20.0000 mg | ORAL_CAPSULE | ORAL | Status: DC

## 2016-05-29 NOTE — Progress Notes (Signed)
Assessment and Plan:  1. Hypertension -Continue medication, monitor blood pressure at home. Continue DASH diet.  Reminder to go to the ER if any CP, SOB, nausea, dizziness, severe HA, changes vision/speech, left arm numbness and tingling and jaw pain.  2. Cholesterol -Continue diet and exercise. Check cholesterol.   3. Prediabetes  -Continue diet and exercise. Check A1C  4. Vitamin D Def - check level and continue medications.   5. Patient is s/p gastric bypass - Check labs to do to screen for vitamin deficiencies associated with gastric bypass including Vitamin D, B12, iron. Recommend strict diet and exercise.  Will try to not take the PPI daily, zantac sent in  6. NASH/elevated liver function Check labs, avoid tylenol, alcohol, weight loss advised.   7. ADD She is on adderall does not take daily, the XR -  Continue ADD medication, helps with focus, no AE's. The patient was counseled on the addictive nature of the medication and was encouraged to take drug holidays when not needed.    Future Appointments Date Time Provider [place]  11/30/2016 10:00 AM Unk Pinto, MD [facility] None    Continue diet and meds as discussed. Further disposition pending results of labs. Over 30 minutes of exam, counseling, chart review, and critical decision making was performed  HPI 55 y.o. female  presents for 3 month follow up on hypertension, cholesterol, prediabetes, and vitamin D deficiency.   Her blood pressure has been controlled at home, today their BP is BP: 124/70 mmHg  She does workout, walking daily with a neighbor started back this week. She denies chest pain, shortness of breath, dizziness.  She is not on cholesterol medication and denies myalgias. Her cholesterol is at goal. The cholesterol last visit was:   Lab Results  Component Value Date   CHOL 189 11/29/2015   HDL 82 11/29/2015   LDLCALC 93 11/29/2015   TRIG 69 11/29/2015   CHOLHDL 2.3 11/29/2015    She  has been working on diet and exercise for prediabetes, she has lost 100 + lbs s/p gastric bypass in 2000 and has a history of reactive hypoglycemia with secondary seizures, and denies hypoglycemia , paresthesia of the feet, polydipsia, polyuria and visual disturbances. Last A1C in the office was:  Lab Results  Component Value Date   HGBA1C 5.3 11/29/2015   Patient is on Vitamin D supplement, 50,000 5 days a week.  Lab Results  Component Value Date   VD25OH 61 11/29/2015     She has history of NASH via biopsy, follows with Dr. Deatra Ina. Lab Results  Component Value Date   ALT 64* 11/29/2015   AST 62* 11/29/2015   ALKPHOS 84 11/29/2015   BILITOT 0.7 11/29/2015   She has chronic lower back pain, follows with Dr. Mina Marble. Sees Dr. Berenice Primas for her knees.   BMI is Body mass index is 29.53 kg/(m^2)., she is working on diet and exercise. Wt Readings from Last 8 Encounters:  05/29/16 156 lb 3.2 oz (70.852 kg)  11/29/15 154 lb 12.8 oz (70.217 kg)  08/30/15 151 lb 9.6 oz (68.765 kg)  05/30/15 156 lb (70.761 kg)  05/06/15 155 lb 8 oz (70.534 kg)  04/25/15 155 lb (70.308 kg)  03/04/15 161 lb (73.029 kg)  01/29/15 161 lb 9.6 oz (73.301 kg)    Current Medications:  Current Outpatient Prescriptions on File Prior to Visit  Medication Sig Dispense Refill  . Arginine 500 MG CAPS Take 500 mg by mouth daily.    Marland Kitchen aspirin  81 MG tablet Take 81 mg by mouth daily.    Marland Kitchen b complex vitamins tablet Take 1 tablet by mouth daily.    . Cholecalciferol (VITAMIN D3) 50000 units CAPS Take daily or as directed 30 capsule 4  . ergocalciferol (VITAMIN D2) 50000 UNITS capsule     . milk thistle 175 MG tablet Take 175 mg by mouth daily.    . Multiple Vitamin (MULTIVITAMIN) tablet Take 1 tablet by mouth daily.    . NON FORMULARY Deep Blue Doterra Oil    . NON FORMULARY DDR, cellular complex    . NON FORMULARY Slim and Sassy, metabolic blend    . NON FORMULARY Zendocrine    . NON FORMULARY Mitro 2 Max    . NON  FORMULARY XEO Mega, omega complex    . NON FORMULARY Tri Ease Seasonal Blend    . NON FORMULARY Bone Nutrient    . NON FORMULARY Keratin    . NON FORMULARY Collagen Forensic psychologist    . Omega-3 Fatty Acids (FISH OIL) 1200 MG CAPS Take 1 capsule by mouth daily.    . pantoprazole (PROTONIX) 40 MG tablet Take 1 tablet (40 mg total) by mouth daily. 30 tablet 5  . polycarbophil (FIBERCON) 625 MG tablet Take 625 mg by mouth daily.    Marland Kitchen amphetamine-dextroamphetamine (ADDERALL XR) 20 MG 24 hr capsule Take 1 capsule (20 mg total) by mouth every morning. 30 capsule 0   No current facility-administered medications on file prior to visit.   Medical History:  Past Medical History  Diagnosis Date  . HTN (hypertension)   . Hypoglycemia   . Goiter   . Murmur   . Dyspnea   . Cholecystitis, acute with cholelithiasis 07/19/2013  . Seizure disorder, primary generalized ([place]) 07/19/2013    May be related to hypoglycemia  . Hypoglycemia 07/19/2013  . ADD (attention deficit disorder) 07/19/2013  . GERD (gastroesophageal reflux disease) 07/19/2013  . Chronic left shoulder pain 07/19/2013  . Arthritis   . Pre-diabetes   . Sleep apnea   . Seizures ([place]) 2013    grand mal due to hypoglycemia   Allergies: No Known Allergies   Review of Systems:  Review of Systems  Constitutional: Negative.   HENT: Negative.   Eyes: Negative.   Respiratory: Negative.   Cardiovascular: Negative.   Gastrointestinal: Negative.   Genitourinary: Negative.   Musculoskeletal: Positive for myalgias, back pain and joint pain. Negative for falls and neck pain.  Skin: Negative.   Neurological: Negative.   Endo/Heme/Allergies: Negative.   Psychiatric/Behavioral: Negative.     Family history- Review and unchanged Social history- Review and unchanged Physical Exam: BP 124/70 mmHg  Pulse 84  Temp(Src) 98.1 F (36.7 C) (Temporal)  Resp 16  Ht 5' 1"  (1.549 m)  Wt 156 lb 3.2 oz (70.852 kg)  BMI 29.53 kg/m2  SpO2 97% Wt Readings  from Last 3 Encounters:  05/29/16 156 lb 3.2 oz (70.852 kg)  11/29/15 154 lb 12.8 oz (70.217 kg)  08/30/15 151 lb 9.6 oz (68.765 kg)   General Appearance: Well nourished, in no apparent distress. Eyes: PERRLA, EOMs, conjunctiva no swelling or erythema Sinuses: No Frontal/maxillary tenderness ENT/Mouth: Ext aud canals clear, TMs without erythema, bulging. No erythema, swelling, or exudate on post pharynx.  Tonsils not swollen or erythematous. Hearing normal.  Neck: Supple, thyroid normal.  Respiratory: Respiratory effort normal, BS equal bilaterally without rales, rhonchi, wheezing or stridor.  Cardio: RRR with no MRGs. Brisk peripheral pulses without edema.  Abdomen: Soft, + BS,  Non tender, no guarding, rebound, hernias, masses. Lymphatics: Non tender without lymphadenopathy.  Musculoskeletal: Full ROM, 5/5 strength, Normal gait Skin: Warm, dry without rashes, lesions, ecchymosis.  Neuro: Cranial nerves intact. Normal muscle tone, no cerebellar symptoms. Psych: Awake and oriented X 3, normal affect, Insight and Judgment appropriate.    Vicie Mutters, PA-C 9:15 AM [facility]

## 2016-05-29 NOTE — Patient Instructions (Signed)
Your ears and sinuses are connected by the eustachian tube. When your sinuses are inflamed, this can close off the tube and cause fluid to collect in your middle ear. This can then cause dizziness, popping, clicking, ringing, and echoing in your ears. This is often NOT an infection and does NOT require antibiotics, it is caused by inflammation so the treatments help the inflammation. This can take a long time to get better so please be patient.  Here are things you can do to help with this: - Try the Flonase or Nasonex. Remember to spray each nostril twice towards the outer part of your eye.  Do not sniff but instead pinch your nose and tilt your head back to help the medicine get into your sinuses.  The best time to do this is at bedtime.Stop if you get blurred vision or nose bleeds.  -While drinking fluids, pinch and hold nose close and swallow, to help open eustachian tubes to drain fluid behind ear drums. -Please pick one of the over the counter allergy medications below and take it once daily for allergies.  It will also help with fluid behind ear drums. Claritin or loratadine cheapest but likely the weakest  Zyrtec or certizine at night because it can make you sleepy The strongest is allegra or fexafinadine  Cheapest at [employer], [employer], [employer] -can use decongestant over the counter, please do not use if you have high blood pressure or certain heart conditions.   if worsening HA, changes vision/speech, imbalance, weakness go to the ER    GETTING OFF OF PPI's    Nexium/protonix/prilosec/Omeprazole/Dexilant/Aciphex are called PPI's, they are great at healing your stomach but should only be taken for a short period of time.     Recent studies have shown that taken for a long time they  can increase the risk of osteoporosis (weakening of your bones), pneumonia, low magnesium, restless legs, Cdiff (infection that causes diarrhea), DEMENTIA and most recently kidney damage / disease / insufficiency.      Due to this information we want to try to stop the PPI but if you try to stop it abruptly this can cause rebound acid and worsening symptoms.   So this is how we want you to get off the PPI:  - Start taking the nexium/protonix/prilosec/PPI  every other day with  zantac (ranitidine) 2 x a day for 2-4 weeks  - then decrease the PPI to every 3 days while taking the zantac (ranitidine) twice a day the other  days for 2-4  Weeks  - then you can try the zantac (ranitidine) once at night or up to 2 x day as needed.  - you can continue on this once at night or stop all together  - Avoid alcohol, spicy foods, NSAIDS (aleve, ibuprofen) at this time. See foods below.   +++++++++++++++++++++++++++++++++++++++++++  Food Choices for Gastroesophageal Reflux Disease  When you have gastroesophageal reflux disease (GERD), the foods you eat and your eating habits are very important. Choosing the right foods can help ease the discomfort of GERD. WHAT GENERAL GUIDELINES DO I NEED TO FOLLOW?  Choose fruits, vegetables, whole grains, low-fat dairy products, and low-fat meat, fish, and poultry.  Limit fats such as oils, salad dressings, butter, nuts, and avocado.  Keep a food diary to identify foods that cause symptoms.  Avoid foods that cause reflux. These may be different for different people.  Eat frequent small meals instead of three large meals each day.  Eat your meals slowly,  in a relaxed setting.  Limit fried foods.  Cook foods using methods other than frying.  Avoid drinking alcohol.  Avoid drinking large amounts of liquids with your meals.  Avoid bending over or lying down until 2-3 hours after eating.   WHAT FOODS ARE NOT RECOMMENDED? The following are some foods and drinks that may worsen your symptoms:  Vegetables Tomatoes. Tomato juice. Tomato and spaghetti sauce. Chili peppers. Onion and garlic. Horseradish. Fruits Oranges, grapefruit, and lemon (fruit and  juice). Meats High-fat meats, fish, and poultry. This includes hot dogs, ribs, ham, sausage, salami, and bacon. Dairy Whole milk and chocolate milk. Sour cream. Cream. Butter. Ice cream. Cream cheese.  Beverages Coffee and tea, with or without caffeine. Carbonated beverages or energy drinks. Condiments Hot sauce. Barbecue sauce.  Sweets/Desserts Chocolate and cocoa. Donuts. Peppermint and spearmint. Fats and Oils High-fat foods, including [place] fries and potato chips. Other Vinegar. Strong spices, such as black pepper, white pepper, red pepper, cayenne, curry powder, cloves, ginger, and chili powder. Nexium/protonix/prilosec are called PPI's, they are great at healing your stomach but should only be taken for a short period of time.

## 2016-05-30 LAB — VITAMIN D 25 HYDROXY (VIT D DEFICIENCY, FRACTURES): VIT D 25 HYDROXY: 69 ng/mL (ref 30–100)

## 2016-09-09 ENCOUNTER — Other Ambulatory Visit: Payer: Self-pay | Admitting: Physician Assistant

## 2016-09-09 DIAGNOSIS — Z0001 Encounter for general adult medical examination with abnormal findings: Secondary | ICD-10-CM

## 2016-09-09 DIAGNOSIS — F988 Other specified behavioral and emotional disorders with onset usually occurring in childhood and adolescence: Secondary | ICD-10-CM

## 2016-09-09 MED ORDER — AMPHETAMINE-DEXTROAMPHET ER 20 MG PO CP24: 20.0000 mg | ORAL_CAPSULE | ORAL | 0 refills | Status: DC

## 2016-10-20 ENCOUNTER — Other Ambulatory Visit: Payer: Self-pay

## 2016-10-20 ENCOUNTER — Encounter: Payer: Self-pay | Admitting: Physician Assistant

## 2016-10-20 ENCOUNTER — Ambulatory Visit (INDEPENDENT_AMBULATORY_CARE_PROVIDER_SITE_OTHER): Payer: BLUE CROSS/BLUE SHIELD | Admitting: Physician Assistant

## 2016-10-20 VITALS — BP 140/82 | HR 93 | Temp 98.6°F | Resp 16 | Ht 61.0 in | Wt 156.2 lb

## 2016-10-20 DIAGNOSIS — E559 Vitamin D deficiency, unspecified: Secondary | ICD-10-CM

## 2016-10-20 DIAGNOSIS — Z0001 Encounter for general adult medical examination with abnormal findings: Secondary | ICD-10-CM

## 2016-10-20 DIAGNOSIS — H6592 Unspecified nonsuppurative otitis media, left ear: Secondary | ICD-10-CM

## 2016-10-20 MED ORDER — PREDNISONE 20 MG PO TABS: ORAL_TABLET | ORAL | 0 refills | Status: DC

## 2016-10-20 MED ORDER — VITAMIN D3 1.25 MG (50000 UT) PO CAPS: ORAL_CAPSULE | ORAL | 4 refills | Status: DC

## 2016-10-20 NOTE — Progress Notes (Signed)
Subjective:    Patient ID: Emily Graham, female    DOB: 1961-04-04, 55 y.o.   MRN: [id number]  HPI 55 y.o. WF presents with left ear pain x Thursday. Popping, clicking, decreased hearing, had fever Saturday 101, has had allergies, itchy/watery eyes, rhinorrhea, non productive cough. On allergy pill daily on "natural allergy pill", put wax removal drops in her ears that did not help. No HA, dizziness, sore throat, nausea, changes in vision.   Blood pressure 140/82, pulse 93, temperature 98.6 F (37 C), resp. rate 16, height 5' 1"  (1.549 m), weight 156 lb 3.2 oz (70.9 kg), SpO2 96 %.  Medications Current Outpatient Prescriptions on File Prior to Visit  Medication Sig  . Arginine 500 MG CAPS Take 500 mg by mouth daily.  Marland Kitchen aspirin 81 MG tablet Take 81 mg by mouth daily.  Marland Kitchen b complex vitamins tablet Take 1 tablet by mouth daily.  . Cholecalciferol (VITAMIN D3) 50000 units CAPS Take daily or as directed  . ergocalciferol (VITAMIN D2) 50000 UNITS capsule   . milk thistle 175 MG tablet Take 175 mg by mouth daily.  . Multiple Vitamin (MULTIVITAMIN) tablet Take 1 tablet by mouth daily.  . NON FORMULARY Deep Blue Doterra Oil  . NON FORMULARY DDR, cellular complex  . NON FORMULARY Slim and Sassy, metabolic blend  . NON FORMULARY Zendocrine  . NON FORMULARY Mitro 2 Max  . NON FORMULARY XEO Mega, omega complex  . NON FORMULARY Tri Ease Seasonal Blend  . NON FORMULARY Bone Nutrient  . NON FORMULARY Keratin  . NON FORMULARY Collagen Forensic psychologist  . Omega-3 Fatty Acids (FISH OIL) 1200 MG CAPS Take 1 capsule by mouth daily.  . polycarbophil (FIBERCON) 625 MG tablet Take 625 mg by mouth daily.  . ranitidine (ZANTAC) 300 MG tablet Take twice a day while trying to get off PPI, then can go to once at night  . amphetamine-dextroamphetamine (ADDERALL XR) 20 MG 24 hr capsule Take 1 capsule (20 mg total) by mouth every morning.   No current facility-administered medications on file prior to visit.      Problem list She has HTN (hypertension); Hyperlipidemia; Cholecystitis, acute with cholelithiasis; Seizure disorder, primary generalized ([place]); ADD (attention deficit disorder); GERD (gastroesophageal reflux disease); Chronic left shoulder pain; Prediabetes; Vitamin D deficiency; Medication management; Rectal bleeding; History of colonic polyps; Abnormal liver function tests; Periumbilical abdominal pain; Liver fibrosis ([place]); and NASH (nonalcoholic steatohepatitis) on her problem list.  Review of Systems  Constitutional: Negative.  Negative for chills, fatigue and fever.  HENT: Positive for ear pain, postnasal drip and sinus pressure. Negative for congestion, dental problem, drooling, ear discharge, facial swelling, hearing loss, mouth sores, nosebleeds, rhinorrhea, sneezing, sore throat, tinnitus and trouble swallowing.   Eyes: Negative.  Negative for visual disturbance.  Respiratory: Positive for cough. Negative for apnea, choking, chest tightness, shortness of breath, wheezing and stridor.   Cardiovascular: Negative.   Genitourinary: Negative.   Neurological: Positive for headaches. Negative for dizziness, tremors, seizures, syncope, facial asymmetry, speech difficulty, weakness, light-headedness and numbness.       Objective:   Physical Exam  Constitutional: She is oriented to person, place, and time. She appears well-developed and well-nourished.  HENT:  Head: Normocephalic and atraumatic.  Right Ear: Hearing and external ear normal. No mastoid tenderness. Tympanic membrane is not injected, not erythematous, not retracted and not bulging. A middle ear effusion is present.  Left Ear: Hearing and external ear normal. No mastoid tenderness. Tympanic membrane is not  injected, not erythematous, not retracted and not bulging. A middle ear effusion is present.  + TMJ left ear without abscess  Eyes: Conjunctivae are normal. Pupils are equal, round, and reactive to light.  Neck: Normal  range of motion. Neck supple.  Cardiovascular: Normal rate.   Pulmonary/Chest: Effort normal and breath sounds normal.  Musculoskeletal: Normal range of motion.  Neurological: She is alert and oriented to person, place, and time.  Skin: Skin is warm and dry.      Assessment & Plan:   Ear effusion, no infection versus TMJ -Allergy pill, flonase, autoinflation, explained no need for ABX at this time.  - information given to the patient, no gum/decrease hard foods, warm wet wash clothes, decrease stress, wear night guard, can do massage, and exercise. - IF HEARING NOT BETTER 1-2 WEEKS WILL SEND TO ENT - TAKE PREDNISONE  Future Appointments Date Time Provider [place]  11/30/2016 10:00 AM Unk Pinto, MD [facility] None

## 2016-10-20 NOTE — Telephone Encounter (Signed)
Entered in error/provider took care of this refill

## 2016-10-20 NOTE — Patient Instructions (Addendum)
Take the prednisone Do the autoinflattion Heating pad to your jaw/ear Wear mouth guard If not better will refer for hearing test/ENT  Please take the prednisone to help decrease inflammation and therefore decrease symptoms. Take it it with food to avoid GI upset. It can cause increased energy but on the other hand it can make it hard to sleep at night so please take it AT [place], it takes 8-12 hours to start working so it will NOT affect your sleeping if you take it at night with your food!!  If you are diabetic it will increase your sugars so decrease carbs and monitor your sugars closely.     Your ears and sinuses are connected by the eustachian tube. When your sinuses are inflamed, this can close off the tube and cause fluid to collect in your middle ear. This can then cause dizziness, popping, clicking, ringing, and echoing in your ears. This is often NOT an infection and does NOT require antibiotics, it is caused by inflammation so the treatments help the inflammation. This can take a long time to get better so please be patient.  Here are things you can do to help with this: - Try the Flonase or Nasonex. Remember to spray each nostril twice towards the outer part of your eye.  Do not sniff but instead pinch your nose and tilt your head back to help the medicine get into your sinuses.  The best time to do this is at bedtime.Stop if you get blurred vision or nose bleeds.  -While drinking fluids, pinch and hold nose close and swallow, to help open eustachian tubes to drain fluid behind ear drums. -Please pick one of the over the counter allergy medications below and take it once daily for allergies.  It will also help with fluid behind ear drums. Claritin or loratadine cheapest but likely the weakest  Zyrtec or certizine at night because it can make you sleepy The strongest is allegra or fexafinadine  Cheapest at [employer], [employer], [employer] -can use decongestant over the counter, please do  not use if you have high blood pressure or certain heart conditions.   if worsening HA, changes vision/speech, imbalance, weakness go to the ER   What is the TMJ? The temporomandibular (tem-[id number]-ro-man-DIB-[id number]-ler) joint, or the TMJ, connects the upper and lower jawbones. This joint allows the jaw to open wide and move back and forth when you chew, talk, or yawn.There are also several muscles that help this joint move. There can be muscle tightness and pain in the muscle that can cause several symptoms.  What causes TMJ pain? There are many causes of TMJ pain. Repeated chewing (for example, chewing gum) and clenching your teeth can cause pain in the joint. Some TMJ pain has no obvious cause. What can I do to ease the pain? There are many things you can do to help your pain get better. When you have pain:  Eat soft foods and stay away from chewy foods (for example, taffy) Try to use both sides of your mouth to chew Don't chew gum Massage Don't open your mouth wide (for example, during yawning or singing) Don't bite your cheeks or fingernails Lower your amount of stress and worry Applying a warm, damp washcloth to the joint may help. Over-the-counter pain medicines such as ibuprofen (one brand: Advil) or acetaminophen (one brand: Tylenol) might also help. Do not use these medicines if you are allergic to them or if your doctor told you not to use  them. How can I stop the pain from coming back? When your pain is better, you can do these exercises to make your muscles stronger and to keep the pain from coming back:  Resisted mouth opening: Place your thumb or two fingers under your chin and open your mouth slowly, pushing up lightly on your chin with your thumb. Hold for three to six seconds. Close your mouth slowly. Resisted mouth closing: Place your thumbs under your chin and your two index fingers on the ridge between your mouth and the bottom of your chin. Push down lightly on your chin as you  close your mouth. Tongue up: Slowly open and close your mouth while keeping the tongue touching the roof of the mouth. Side-to-side jaw movement: Place an object about one fourth of an inch thick (for example, two tongue depressors) between your front teeth. Slowly move your jaw from side to side. Increase the thickness of the object as the exercise becomes easier Forward jaw movement: Place an object about one fourth of an inch thick between your front teeth and move the bottom jaw forward so that the bottom teeth are in front of the top teeth. Increase the thickness of the object as the exercise becomes easier. These exercises should not be painful. If it hurts to do these exercises, stop doing them and talk to your family doctor.     Vitamin D goal is between 60-80  Please make sure that you are taking your Vitamin D as directed.   It is very important as a natural anti-inflammatory   helping hair, skin, and nails, as well as reducing stroke and heart attack risk.   It helps your bones and helps with mood.  It also decreases numerous cancer risks so please take it as directed.   Low Vit D is associated with a 200-300% higher risk for CANCER   and 200-300% higher risk for HEART   ATTACK  &  STROKE.    .....................................Marland Kitchen  It is also associated with higher death rate at younger ages,   autoimmune diseases like Rheumatoid arthritis, Lupus, Multiple Sclerosis.     Also many other serious conditions, like depression, Alzheimer's  Dementia, infertility, muscle aches, fatigue, fibromyalgia - just to name a few.  +++++++++++++++++++  Can get liquid vitamin D from [place] here in [place] at  [facility] alternatives [address] [place], [place] [address]

## 2016-11-30 ENCOUNTER — Ambulatory Visit (INDEPENDENT_AMBULATORY_CARE_PROVIDER_SITE_OTHER): Payer: BLUE CROSS/BLUE SHIELD | Admitting: Internal Medicine

## 2016-11-30 ENCOUNTER — Encounter: Payer: Self-pay | Admitting: Internal Medicine

## 2016-11-30 VITALS — BP 142/78 | HR 84 | Temp 97.3°F | Resp 16 | Ht 61.75 in | Wt 153.6 lb

## 2016-11-30 DIAGNOSIS — I10 Essential (primary) hypertension: Secondary | ICD-10-CM | POA: Diagnosis not present

## 2016-11-30 DIAGNOSIS — Z136 Encounter for screening for cardiovascular disorders: Secondary | ICD-10-CM | POA: Diagnosis not present

## 2016-11-30 DIAGNOSIS — E782 Mixed hyperlipidemia: Secondary | ICD-10-CM

## 2016-11-30 DIAGNOSIS — E559 Vitamin D deficiency, unspecified: Secondary | ICD-10-CM | POA: Diagnosis not present

## 2016-11-30 DIAGNOSIS — Z79899 Other long term (current) drug therapy: Secondary | ICD-10-CM | POA: Diagnosis not present

## 2016-11-30 DIAGNOSIS — R5383 Other fatigue: Secondary | ICD-10-CM

## 2016-11-30 DIAGNOSIS — R7303 Prediabetes: Secondary | ICD-10-CM

## 2016-11-30 DIAGNOSIS — Z1212 Encounter for screening for malignant neoplasm of rectum: Secondary | ICD-10-CM

## 2016-11-30 DIAGNOSIS — Z111 Encounter for screening for respiratory tuberculosis: Secondary | ICD-10-CM

## 2016-11-30 DIAGNOSIS — F900 Attention-deficit hyperactivity disorder, predominantly inattentive type: Secondary | ICD-10-CM

## 2016-11-30 DIAGNOSIS — Z0000 Encounter for general adult medical examination without abnormal findings: Secondary | ICD-10-CM

## 2016-11-30 DIAGNOSIS — K219 Gastro-esophageal reflux disease without esophagitis: Secondary | ICD-10-CM

## 2016-11-30 DIAGNOSIS — Z0001 Encounter for general adult medical examination with abnormal findings: Secondary | ICD-10-CM

## 2016-11-30 MED ORDER — ESTRADIOL 0.1 MG/GM VA CREA: 1.0000 | TOPICAL_CREAM | Freq: Every day | VAGINAL | 12 refills | Status: DC

## 2016-11-30 MED ORDER — RANITIDINE HCL 300 MG PO TABS: ORAL_TABLET | ORAL | 3 refills | Status: DC

## 2016-11-30 MED ORDER — AMPHETAMINE-DEXTROAMPHET ER 20 MG PO CP24: 20.0000 mg | ORAL_CAPSULE | ORAL | 0 refills | Status: DC

## 2016-11-30 NOTE — Progress Notes (Signed)
[facility] Unk Pinto, M.D.    Uvaldo Bristle. Silverio Lay, P.A.-C      Starlyn Skeans, P.A.-C  [facility]                [address] [place], [place] [address] Telephone [phone] Telefax [phone]  Annual Screening/Preventative Visit & Comprehensive Evaluation &  Examination     This very nice 55 y.o. MWF presents for a Screening/Preventative Visit & comprehensive evaluation and management of multiple medical co-morbidities.  Patient has been followed for HTN, Prediabetes, Hyperlipidemia and Vitamin D Deficiency. Other problems include Lumbar DDD w/hx/o LBP and for which she underwent EDSI's by Dr Mina Marble. Also, patient has hx/o GERD - now weaned off Protonix and taking Ranitidine on an occasional basis for break-thru .       Patient has been followed expectantly for labile HTN predates since 2000 when she was noted to have an elevated BP. Patient's BP has since been controlled at home and patient denies any cardiac symptoms as chest pain, palpitations, shortness of breath, dizziness or ankle swelling. Today's BP is sl elevated at 142/78.       Patient's hyperlipidemia is controlled with diet and medications. Patient denies myalgias or other medication SE's. Last lipids were at goal: Lab Results  Component Value Date   CHOL 161 05/29/2016   HDL 59 05/29/2016   LDLCALC 88 05/29/2016   TRIG 69 05/29/2016   CHOLHDL 2.7 05/29/2016      Patient has  hx/o reactive hypoglycemia and denies reactive hypoglycemic symptoms, visual blurring, diabetic polys, or paresthesias. Last A1c was at goal:  Lab Results  Component Value Date   HGBA1C 5.3 11/29/2015      Finally, patient has history of Vitamin D Deficiency since 2009 with "27" and last Vitamin D was at goal:  Lab Results  Component Value Date   VD25OH 69 05/29/2016   Current Outpatient Prescriptions on File Prior to Visit  Medication Sig  . Arginine  500 MG  Take 500 mg by mouth daily.  Marland Kitchen aspirin 81 MG  Take 81 mg by mouth daily.  Marland Kitchen b complex vitamins  Take 1 tablet by mouth daily.  Marland Kitchen VITAMIN D2 50,000 UNITS    . milk thistle 175 MG  Take 175 mg by mouth daily.  . MULTIVITAMIN  Take 1 tablet by mouth daily.  . NON FORMULARY Deep Blue Doterra Oil  . NON FORMULARY DDR, cellular complex  . NON FORMULARY Slim and Sassy, metabolic blend  . NON FORMULARY Zendocrine  . NON FORMULARY Mitro 2 Max  . NON FORMULARY XEO Mega, omega complex  . NON FORMULARY Tri Ease Seasonal Blend  . NON FORMULARY Bone Nutrient  . NON FORMULARY Keratin  . NON FORMULARY Collagen Forensic psychologist  . Omega-3 FISH OIL 1200 MG  Take 1 capsule by mouth daily.  . polycarbophil  625 MG  Take 625 mg by mouth daily.  . ranitidine  300 MG  Take twice a day while trying to get off PPI  . ADDERALL XR 20 MG  Take 1 capsule (20 mg total) by mouth every morning.   No Known Allergies   Past Medical History:  Diagnosis Date  . ADD (attention deficit disorder) 07/19/2013  . Arthritis   . Cholecystitis, acute with cholelithiasis 07/19/2013  . Chronic left shoulder pain 07/19/2013  . Dyspnea   .  GERD (gastroesophageal reflux disease) 07/19/2013  . Goiter   . HTN (hypertension)   . Hypoglycemia   . Hypoglycemia 07/19/2013  . Murmur   . Pre-diabetes   . Seizure disorder, primary generalized ([place]) 07/19/2013   May be related to hypoglycemia  . Seizures ([place]) 2013   grand mal due to hypoglycemia  . Sleep apnea    Health Maintenance  Topic Date Due  . PAP SMEAR  10/02/1981  . COLONOSCOPY  10/02/2010  . INFLUENZA VACCINE  07/14/2016  . MAMMOGRAM  10/16/2016  . TETANUS/TDAP  11/07/2019  . Hepatitis C Screening  Completed  . HIV Screening  Completed   Immunization History  Administered Date(s) Administered  . Influenza-Unspecified 09/15/2016  . Pneumococcal Polysaccharide-23 12/14/1998  . Tdap 11/06/2009   Past Surgical History:  Procedure Laterality Date  . ABDOMINAL  HYSTERECTOMY    . BREAST ENHANCEMENT SURGERY    . CHOLECYSTECTOMY N/A 07/20/2013   Procedure: LAPAROSCOPIC CHOLECYSTECTOMY WITH INTRAOPERATIVE CHOLANGIOGRAM;  Surgeon: Shann Medal, MD;  Location: [facility];  Service: General;  Laterality: N/A;  . GASTRIC BYPASS    . GASTRIC BYPASS    . GASTRIC BYPASS  2000  . Stomach tuck    . TOTAL SHOULDER REPLACEMENT     Family History  Problem Relation Age of Onset  . Coronary artery disease Father     MI at age 53  . Heart disease Father   . Hypertension Father   . Diabetes Father   . Skin cancer Mother   . Hypertension Mother   . Hyperlipidemia Mother   . Breast cancer Maternal Aunt   . Breast cancer Paternal Aunt   . Breast cancer Paternal Uncle   . Cancer Maternal Aunt   . Cancer Cousin   . Colon cancer Neg Hx    Social History  Substance Use Topics  . Smoking status: Never Smoker  . Smokeless tobacco: Never Used  . Alcohol use 0.5 oz/week    1 Standard drinks or equivalent per week     Comment: Occasional    ROS Constitutional: Denies fever, chills, weight loss/gain, headaches, insomnia,  night sweats, and change in appetite. Does c/o fatigue. Eyes: Denies redness, blurred vision, diplopia, discharge, itchy, watery eyes.  ENT: Denies discharge, congestion, post nasal drip, epistaxis, sore throat, earache, hearing loss, dental pain, Tinnitus, Vertigo, Sinus pain, snoring.  Cardio: Denies chest pain, palpitations, irregular heartbeat, syncope, dyspnea, diaphoresis, orthopnea, PND, claudication, edema Respiratory: denies cough, dyspnea, DOE, pleurisy, hoarseness, laryngitis, wheezing.  Gastrointestinal: Denies dysphagia, heartburn, reflux, water brash, pain, cramps, nausea, vomiting, bloating, diarrhea, constipation, hematemesis, melena, hematochezia, jaundice, hemorrhoids Genitourinary: Denies dysuria, frequency, urgency, nocturia, hesitancy, discharge, hematuria, flank pain Breast: Breast lumps, nipple discharge, bleeding.   Musculoskeletal: Denies arthralgia, myalgia, stiffness, Jt. Swelling, pain, limp, and strain/sprain. Denies falls. Skin: Denies puritis, rash, hives, warts, acne, eczema, changing in skin lesion Neuro: No weakness, tremor, incoordination, spasms, paresthesia, pain Psychiatric: Denies confusion, memory loss, sensory loss. Denies Depression. Endocrine: Denies change in weight, skin, hair change, nocturia, and paresthesia, diabetic polys, visual blurring, hyper / hypo glycemic episodes.  Heme/Lymph: No excessive bleeding, bruising, enlarged lymph nodes.  Physical Exam  BP (!) 142/78   Pulse 84   Temp 97.3 F (36.3 C)   Resp 16   Ht 5' 1.75" (1.568 m)   Wt 153 lb 9.6 oz (69.7 kg)   BMI 28.32 kg/m   General Appearance: Well nourished and in no apparent distress.  Eyes: PERRLA, EOMs, conjunctiva no swelling or  erythema, normal fundi and vessels. Sinuses: No frontal/maxillary tenderness ENT/Mouth: EACs patent / TMs  nl. Nares clear without erythema, swelling, mucoid exudates. Oral hygiene is good. No erythema, swelling, or exudate. Tongue normal, non-obstructing. Tonsils not swollen or erythematous. Hearing normal.  Neck: Supple, thyroid normal. No bruits, nodes or JVD. Respiratory: Respiratory effort normal.  BS equal and clear bilateral without rales, rhonci, wheezing or stridor. Cardio: Heart sounds are normal with regular rate and rhythm and no murmurs, rubs or gallops. Peripheral pulses are normal and equal bilaterally without edema. No aortic or femoral bruits. Chest: symmetric with normal excursions and percussion. Breasts: Symmetric, without lumps, nipple discharge, retractions, or fibrocystic changes.  Abdomen: Flat, soft with bowel sounds active. Nontender, no guarding, rebound, hernias, masses, or organomegaly.  Lymphatics: Non tender without lymphadenopathy.  Genitourinary:  Musculoskeletal: Full ROM all peripheral extremities, joint stability, 5/5 strength, and normal  gait. Skin: Warm and dry without rashes, lesions, cyanosis, clubbing or  ecchymosis.  Neuro: Cranial nerves intact, reflexes equal bilaterally. Normal muscle tone, no cerebellar symptoms. Sensation intact.  Pysch: Alert and oriented X 3, normal affect, Insight and Judgment appropriate.   Assessment and Plan  1. Annual Preventative Screening Examination   2. Essential hypertension  - Microalbumin / creatinine urine ratio - EKG 12-Lead - [place], RETROPERITNL ABD,  LTD - Urinalysis, Routine w reflex microscopic - CBC with Differential/Platelet - BASIC METABOLIC PANEL WITH GFR - TSH  3. Mixed hyperlipidemia  - EKG 12-Lead - [place], RETROPERITNL ABD,  LTD - Hepatic function panel - Lipid panel - TSH  4. Prediabetes  - EKG 12-Lead - [place], RETROPERITNL ABD,  LTD - Hemoglobin A1c - Insulin, random  5. Vitamin D deficiency  - VITAMIN D 25 Hydroxy  6. Gastroesophageal reflux disease   7. Screening for rectal cancer  - POC Hemoccult Bld/Stl  8. Screening for ischemic heart disease  - EKG 12-Lead  9. Screening for AAA (aortic abdominal aneurysm)  - [place], RETROPERITNL ABD,  LTD  10. Other fatigue  - Vitamin B12 - Iron and TIBC - CBC with Differential/Platelet  11. Medication management  - Urinalysis, Routine w reflex microscopic - CBC with Differential/Platelet - BASIC METABOLIC PANEL WITH GFR - Magnesium  12. Attention deficit hyperactivity disorder (ADHD), predominantly inattentive type  - ADDERALL XR 20 MG ; Take 1 caps every morning.  Dispense: 30 caps; Refill: 0  13. Screening examination for pulmonary tuberculosis  - PPD      Continue prudent diet as discussed, weight control, BP monitoring, regular exercise, and medications. Discussed med's effects and SE's. Screening labs and tests as requested with regular follow-up as recommended. Over 40 minutes of exam, counseling, chart review and high complex critical decision making was performed.

## 2016-11-30 NOTE — Patient Instructions (Signed)

## 2016-12-01 LAB — MICROALBUMIN / CREATININE URINE RATIO
CREATININE, URINE: 276 mg/dL (ref 20–320)
MICROALB UR: 2.4 mg/dL
Microalb Creat Ratio: 9 mcg/mg creat (ref <30)

## 2016-12-01 LAB — IRON AND TIBC
%SAT: 7 % — AB (ref 11–50)
IRON: 29 ug/dL — AB (ref 45–160)
TIBC: 442 ug/dL (ref 250–450)
UIBC: 413 ug/dL — AB (ref 125–400)

## 2016-12-01 LAB — BASIC METABOLIC PANEL WITH GFR
BUN: 11 mg/dL (ref 7–25)
CALCIUM: 9 mg/dL (ref 8.6–10.4)
CO2: 25 mmol/L (ref 20–31)
CREATININE: 0.58 mg/dL (ref 0.50–1.05)
Chloride: 106 mmol/L (ref 98–110)
GFR, Est African American: >89 mL/min (ref >=60)
GFR, Est Non African American: >89 mL/min (ref >=60)
Glucose, Bld: 81 mg/dL (ref 65–99)
Potassium: 4.6 mmol/L (ref 3.5–5.3)
SODIUM: 142 mmol/L (ref 135–146)

## 2016-12-01 LAB — URINALYSIS, ROUTINE W REFLEX MICROSCOPIC
Bilirubin Urine: NEGATIVE
Hgb urine dipstick: NEGATIVE
LEUKOCYTES UA: NEGATIVE
Nitrite: NEGATIVE
Protein, ur: NEGATIVE
SPECIFIC GRAVITY, URINE: 1.027 (ref 1.001–1.035)
pH: 5.5 (ref 5.0–8.0)

## 2016-12-01 LAB — CBC WITH DIFFERENTIAL/PLATELET
BASOS ABS: 0 {cells}/uL (ref 0–200)
Basophils Relative: 0 %
EOS ABS: 350 {cells}/uL (ref 15–500)
EOS PCT: 7 %
HCT: 43.9 % (ref 35.0–45.0)
Hemoglobin: 13.6 g/dL (ref 11.7–15.5)
Lymphocytes Relative: 29 %
Lymphs Abs: 1450 cells/uL (ref 850–3900)
MCH: 28 pg (ref 27.0–33.0)
MCHC: 31 g/dL — AB (ref 32.0–36.0)
MCV: 90.3 fL (ref 80.0–100.0)
MONOS PCT: 8 %
MPV: 9.8 fL (ref 7.5–12.5)
Monocytes Absolute: 400 cells/uL (ref 200–950)
NEUTROS PCT: 56 %
Neutro Abs: 2800 cells/uL (ref 1500–7800)
PLATELETS: 263 10*3/uL (ref 140–400)
RBC: 4.86 MIL/uL (ref 3.80–5.10)
RDW: 14.9 % (ref 11.0–15.0)
WBC: 5 10*3/uL (ref 3.8–10.8)

## 2016-12-01 LAB — HEPATIC FUNCTION PANEL
ALBUMIN: 4.3 g/dL (ref 3.6–5.1)
ALT: 26 U/L (ref 6–29)
AST: 31 U/L (ref 10–35)
Alkaline Phosphatase: 61 U/L (ref 33–130)
BILIRUBIN DIRECT: 0.1 mg/dL (ref <=0.2)
BILIRUBIN TOTAL: 0.4 mg/dL (ref 0.2–1.2)
Indirect Bilirubin: 0.3 mg/dL (ref 0.2–1.2)
Total Protein: 7 g/dL (ref 6.1–8.1)

## 2016-12-01 LAB — LIPID PANEL
CHOL/HDL RATIO: 2.3 ratio (ref <5.0)
Cholesterol: 184 mg/dL (ref <200)
HDL: 80 mg/dL (ref >50)
LDL Cholesterol: 90 mg/dL (ref <100)
Triglycerides: 68 mg/dL (ref <150)
VLDL: 14 mg/dL (ref <30)

## 2016-12-01 LAB — HEMOGLOBIN A1C
HEMOGLOBIN A1C: 5 % (ref <5.7)
MEAN PLASMA GLUCOSE: 97 mg/dL

## 2016-12-01 LAB — VITAMIN B12: VITAMIN B 12: 346 pg/mL (ref 200–1100)

## 2016-12-01 LAB — VITAMIN D 25 HYDROXY (VIT D DEFICIENCY, FRACTURES): VIT D 25 HYDROXY: 73 ng/mL (ref 30–100)

## 2016-12-01 LAB — MAGNESIUM: MAGNESIUM: 2.3 mg/dL (ref 1.5–2.5)

## 2016-12-01 LAB — TSH: TSH: 2.07 m[IU]/L

## 2016-12-01 LAB — INSULIN, RANDOM: Insulin: 3.7 u[IU]/mL (ref 2.0–19.6)

## 2016-12-04 ENCOUNTER — Other Ambulatory Visit: Payer: Self-pay | Admitting: Physician Assistant

## 2016-12-04 MED ORDER — ESTROGENS, CONJUGATED 0.625 MG/GM VA CREA: 1.0000 | TOPICAL_CREAM | Freq: Every day | VAGINAL | 12 refills | Status: DC

## 2016-12-10 ENCOUNTER — Other Ambulatory Visit: Payer: Self-pay

## 2016-12-10 DIAGNOSIS — E559 Vitamin D deficiency, unspecified: Secondary | ICD-10-CM

## 2016-12-10 DIAGNOSIS — Z0001 Encounter for general adult medical examination with abnormal findings: Secondary | ICD-10-CM

## 2016-12-10 MED ORDER — ERGOCALCIFEROL 1.25 MG (50000 UT) PO CAPS: 50000.0000 [IU] | ORAL_CAPSULE | ORAL | 0 refills | Status: DC

## 2016-12-11 ENCOUNTER — Other Ambulatory Visit: Payer: Self-pay | Admitting: *Deleted

## 2016-12-11 DIAGNOSIS — Z0001 Encounter for general adult medical examination with abnormal findings: Secondary | ICD-10-CM

## 2016-12-11 DIAGNOSIS — E559 Vitamin D deficiency, unspecified: Secondary | ICD-10-CM

## 2016-12-11 MED ORDER — ERGOCALCIFEROL 1.25 MG (50000 UT) PO CAPS: 50000.0000 [IU] | ORAL_CAPSULE | Freq: Every day | ORAL | 1 refills | Status: DC

## 2016-12-18 ENCOUNTER — Encounter: Payer: Self-pay | Admitting: Internal Medicine

## 2017-01-26 ENCOUNTER — Other Ambulatory Visit: Payer: Self-pay | Admitting: Physician Assistant

## 2017-01-26 DIAGNOSIS — Z0001 Encounter for general adult medical examination with abnormal findings: Secondary | ICD-10-CM

## 2017-01-26 DIAGNOSIS — F900 Attention-deficit hyperactivity disorder, predominantly inattentive type: Secondary | ICD-10-CM

## 2017-01-26 MED ORDER — AMPHETAMINE-DEXTROAMPHET ER 20 MG PO CP24: 20.0000 mg | ORAL_CAPSULE | ORAL | 0 refills | Status: DC

## 2017-02-01 ENCOUNTER — Other Ambulatory Visit: Payer: Self-pay | Admitting: Internal Medicine

## 2017-02-01 DIAGNOSIS — Z0001 Encounter for general adult medical examination with abnormal findings: Secondary | ICD-10-CM

## 2017-02-01 DIAGNOSIS — E559 Vitamin D deficiency, unspecified: Secondary | ICD-10-CM

## 2017-03-11 ENCOUNTER — Other Ambulatory Visit: Payer: Self-pay | Admitting: Physician Assistant

## 2017-03-11 DIAGNOSIS — Z0001 Encounter for general adult medical examination with abnormal findings: Secondary | ICD-10-CM

## 2017-03-11 DIAGNOSIS — F900 Attention-deficit hyperactivity disorder, predominantly inattentive type: Secondary | ICD-10-CM

## 2017-03-11 MED ORDER — AMPHETAMINE-DEXTROAMPHET ER 20 MG PO CP24: 20.0000 mg | ORAL_CAPSULE | ORAL | 0 refills | Status: DC

## 2017-05-19 ENCOUNTER — Other Ambulatory Visit: Payer: Self-pay | Admitting: *Deleted

## 2017-05-19 DIAGNOSIS — Z0001 Encounter for general adult medical examination with abnormal findings: Secondary | ICD-10-CM

## 2017-05-19 DIAGNOSIS — F900 Attention-deficit hyperactivity disorder, predominantly inattentive type: Secondary | ICD-10-CM

## 2017-05-19 MED ORDER — AMPHETAMINE-DEXTROAMPHET ER 20 MG PO CP24: 20.0000 mg | ORAL_CAPSULE | ORAL | 0 refills | Status: DC

## 2017-06-02 ENCOUNTER — Ambulatory Visit: Payer: Self-pay | Admitting: Physician Assistant

## 2017-06-02 NOTE — Progress Notes (Signed)
Assessment and Plan:  Hypertension -Continue medication, monitor blood pressure at home. Continue DASH diet.  Reminder to go to the ER if any CP, SOB, nausea, dizziness, severe HA, changes vision/speech, left arm numbness and tingling and jaw pain.  Cholesterol -Continue diet and exercise. Check cholesterol.    Prediabetes  -Continue diet and exercise. Check A1C   Vitamin D Def - check level and continue medications.    Patient is s/p gastric bypass - Check labs to do to screen for vitamin deficiencies associated with gastric bypass including Vitamin D, B12, iron. Recommend strict diet and exercise.  Check for iron   NASH/elevated liver function Check labs, avoid tylenol, alcohol, weight loss advised.    ADD She is on adderall does not take daily, the XR -  Continue ADD medication, helps with focus, no AE's. The patient was counseled on the addictive nature of the medication and was encouraged to take drug holidays when not needed.    Future Appointments Date Time Provider [place]  12/02/2017 4:00 PM Unk Pinto, MD [facility] None    Continue diet and meds as discussed. Further disposition pending results of labs. Over 30 minutes of exam, counseling, chart review, and critical decision making was performed  HPI 56 y.o. female  presents for 3 month follow up on hypertension, cholesterol, prediabetes, and vitamin D deficiency.   Her blood pressure has been controlled at home, today their BP is BP: 136/90  She does not workout. 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.  She denies chest pain, shortness of breath, dizziness.  She is not on cholesterol medication and denies myalgias. Her cholesterol is at goal. The cholesterol last visit was:   Lab Results  Component Value Date   CHOL 184 11/30/2016   HDL 80 11/30/2016   LDLCALC 90 11/30/2016   TRIG 68 11/30/2016   CHOLHDL 2.3 11/30/2016    She has been working on diet and exercise for prediabetes,  she has lost 100 + lbs s/p gastric bypass in 2000 and has a history of reactive hypoglycemia with secondary seizures, and denies hypoglycemia , paresthesia of the feet, polydipsia, polyuria and visual disturbances. Last A1C in the office was:  Lab Results  Component Value Date   HGBA1C 5.0 11/30/2016   Patient is on Vitamin D supplement, 50,000 5 days a week.  Lab Results  Component Value Date   VD25OH 20 11/30/2016     She has history of NASH via biopsy, follows with Dr. Deatra Ina. Lab Results  Component Value Date   ALT 26 11/30/2016   AST 31 11/30/2016   ALKPHOS 61 11/30/2016   BILITOT 0.4 11/30/2016   She has chronic lower back pain, follows with Dr. Mina Marble. Sees Dr. Berenice Primas for her knees.   BMI is Body mass index is 28.15 kg/m., she is working on diet and exercise. Wt Readings from Last 8 Encounters:  06/03/17 149 lb (67.6 kg)  11/30/16 153 lb 9.6 oz (69.7 kg)  10/20/16 156 lb 3.2 oz (70.9 kg)  05/29/16 156 lb 3.2 oz (70.9 kg)  11/29/15 154 lb 12.8 oz (70.2 kg)  08/30/15 151 lb 9.6 oz (68.8 kg)  05/30/15 156 lb (70.8 kg)  05/06/15 155 lb 8 oz (70.5 kg)    Current Medications:  Current Outpatient Prescriptions on File Prior to Visit  Medication Sig Dispense Refill  . amphetamine-dextroamphetamine (ADDERALL XR) 20 MG 24 hr capsule Take 1 capsule (20 mg total) by mouth every morning. 30 capsule 0  .  Arginine 500 MG CAPS Take 500 mg by mouth daily.    Marland Kitchen aspirin 81 MG tablet Take 81 mg by mouth daily.    Marland Kitchen b complex vitamins tablet Take 1 tablet by mouth daily.    . Cholecalciferol (VITAMIN D3) 50000 units CAPS Take daily or as directed 30 capsule 4  . milk thistle 175 MG tablet Take 175 mg by mouth daily.    . Multiple Vitamin (MULTIVITAMIN) tablet Take 1 tablet by mouth daily.    . NON FORMULARY Deep Blue Doterra Oil    . NON FORMULARY DDR, cellular complex    . NON FORMULARY Slim and Sassy, metabolic blend    . NON FORMULARY Zendocrine    . NON FORMULARY Mitro 2 Max    .  NON FORMULARY XEO Mega, omega complex    . NON FORMULARY Tri Ease Seasonal Blend    . NON FORMULARY Bone Nutrient    . NON FORMULARY Keratin    . NON FORMULARY Collagen Forensic psychologist    . Omega-3 Fatty Acids (FISH OIL) 1200 MG CAPS Take 1 capsule by mouth daily.    . polycarbophil (FIBERCON) 625 MG tablet Take 625 mg by mouth daily.    . Vitamin D, Ergocalciferol, (DRISDOL) 50000 units CAPS capsule TAKE 1 CAPSULE BY MOUTH DAILY 90 capsule 1  . ranitidine (ZANTAC) 300 MG tablet Take twice a day for heartburn & indigestion 60 tablet 3   No current facility-administered medications on file prior to visit.    Medical History:  Past Medical History:  Diagnosis Date  . ADD (attention deficit disorder) 07/19/2013  . Arthritis   . Cholecystitis, acute with cholelithiasis 07/19/2013  . Chronic left shoulder pain 07/19/2013  . Dyspnea   . GERD (gastroesophageal reflux disease) 07/19/2013  . Goiter   . HTN (hypertension)   . Hypoglycemia   . Hypoglycemia 07/19/2013  . Murmur   . Pre-diabetes   . Seizure disorder, primary generalized ([place]) 07/19/2013   May be related to hypoglycemia  . Seizures ([place]) 2013   grand mal due to hypoglycemia  . Sleep apnea    Allergies: No Known Allergies   Review of Systems:  Review of Systems  Constitutional: Negative.   HENT: Negative.   Eyes: Negative.   Respiratory: Negative.   Cardiovascular: Negative.   Gastrointestinal: Negative.   Genitourinary: Negative.   Musculoskeletal: Positive for back pain, joint pain and myalgias. Negative for falls and neck pain.  Skin: Negative.   Neurological: Negative.   Endo/Heme/Allergies: Negative.   Psychiatric/Behavioral: Negative.     Family history- Review and unchanged Social history- Review and unchanged Physical Exam: BP 136/90   Pulse 78   Temp 97.5 F (36.4 C)   Resp 14   Ht 5' 1"  (1.549 m)   Wt 149 lb (67.6 kg)   SpO2 97%   BMI 28.15 kg/m  Wt Readings from Last 3 Encounters:  06/03/17 149 lb  (67.6 kg)  11/30/16 153 lb 9.6 oz (69.7 kg)  10/20/16 156 lb 3.2 oz (70.9 kg)   General Appearance: Well nourished, in no apparent distress. Eyes: PERRLA, EOMs, conjunctiva no swelling or erythema Sinuses: No Frontal/maxillary tenderness ENT/Mouth: Ext aud canals clear, TMs without erythema, bulging. No erythema, swelling, or exudate on post pharynx.  Tonsils not swollen or erythematous. Hearing normal.  Neck: Supple, thyroid normal.  Respiratory: Respiratory effort normal, BS equal bilaterally without rales, rhonchi, wheezing or stridor.  Cardio: RRR with no MRGs. Brisk peripheral pulses without edema.  Abdomen: Soft, + BS,  Non tender, no guarding, rebound, hernias, masses. Lymphatics: Non tender without lymphadenopathy.  Musculoskeletal: Full ROM, 5/5 strength, Normal gait Skin: Warm, dry without rashes, lesions, ecchymosis.  Neuro: Cranial nerves intact. Normal muscle tone, no cerebellar symptoms. Psych: Awake and oriented X 3, normal affect, Insight and Judgment appropriate.    Vicie Mutters, PA-C 4:13 PM [facility]

## 2017-06-03 ENCOUNTER — Encounter: Payer: Self-pay | Admitting: Physician Assistant

## 2017-06-03 ENCOUNTER — Ambulatory Visit (INDEPENDENT_AMBULATORY_CARE_PROVIDER_SITE_OTHER): Payer: BLUE CROSS/BLUE SHIELD | Admitting: Physician Assistant

## 2017-06-03 VITALS — BP 136/90 | HR 78 | Temp 97.5°F | Resp 14 | Ht 61.0 in | Wt 149.0 lb

## 2017-06-03 DIAGNOSIS — R945 Abnormal results of liver function studies: Secondary | ICD-10-CM | POA: Diagnosis not present

## 2017-06-03 DIAGNOSIS — K7581 Nonalcoholic steatohepatitis (NASH): Secondary | ICD-10-CM

## 2017-06-03 DIAGNOSIS — E782 Mixed hyperlipidemia: Secondary | ICD-10-CM

## 2017-06-03 DIAGNOSIS — F988 Other specified behavioral and emotional disorders with onset usually occurring in childhood and adolescence: Secondary | ICD-10-CM

## 2017-06-03 DIAGNOSIS — G40309 Generalized idiopathic epilepsy and epileptic syndromes, not intractable, without status epilepticus: Secondary | ICD-10-CM

## 2017-06-03 DIAGNOSIS — I10 Essential (primary) hypertension: Secondary | ICD-10-CM | POA: Diagnosis not present

## 2017-06-03 DIAGNOSIS — R7989 Other specified abnormal findings of blood chemistry: Secondary | ICD-10-CM

## 2017-06-03 DIAGNOSIS — Z79899 Other long term (current) drug therapy: Secondary | ICD-10-CM

## 2017-06-03 DIAGNOSIS — Z0001 Encounter for general adult medical examination with abnormal findings: Secondary | ICD-10-CM | POA: Diagnosis not present

## 2017-06-03 DIAGNOSIS — D649 Anemia, unspecified: Secondary | ICD-10-CM | POA: Diagnosis not present

## 2017-06-03 DIAGNOSIS — F900 Attention-deficit hyperactivity disorder, predominantly inattentive type: Secondary | ICD-10-CM

## 2017-06-03 DIAGNOSIS — H1013 Acute atopic conjunctivitis, bilateral: Secondary | ICD-10-CM | POA: Diagnosis not present

## 2017-06-03 DIAGNOSIS — R7303 Prediabetes: Secondary | ICD-10-CM

## 2017-06-03 MED ORDER — AMPHETAMINE-DEXTROAMPHET ER 20 MG PO CP24: 20.0000 mg | ORAL_CAPSULE | ORAL | 0 refills | Status: DC

## 2017-06-03 NOTE — Patient Instructions (Signed)
Your ears and sinuses are connected by the eustachian tube. When your sinuses are inflamed, this can close off the tube and cause fluid to collect in your middle ear. This can then cause dizziness, popping, clicking, ringing, and echoing in your ears. This is often NOT an infection and does NOT require antibiotics, it is caused by inflammation so the treatments help the inflammation. This can take a long time to get better so please be patient.  Here are things you can do to help with this: - Try the Flonase or Nasonex. Remember to spray each nostril twice towards the outer part of your eye.  Do not sniff but instead pinch your nose and tilt your head back to help the medicine get into your sinuses.  The best time to do this is at bedtime.Stop if you get blurred vision or nose bleeds.  -While drinking fluids, pinch and hold nose close and swallow, to help open eustachian tubes to drain fluid behind ear drums. -Please pick one of the over the counter allergy medications below and take it once daily for allergies.  It will also help with fluid behind ear drums. Claritin or loratadine cheapest but likely the weakest  Zyrtec or certizine at night because it can make you sleepy The strongest is allegra or fexafinadine  Cheapest at [employer], [employer], [employer] -can use decongestant over the counter, please do not use if you have high blood pressure or certain heart conditions.   if worsening HA, changes vision/speech, imbalance, weakness go to the ER

## 2017-06-04 LAB — CBC WITH DIFFERENTIAL/PLATELET
BASOS ABS: 0 {cells}/uL (ref 0–200)
BASOS PCT: 0 %
EOS ABS: 305 {cells}/uL (ref 15–500)
EOS PCT: 5 %
HCT: 41.9 % (ref 35.0–45.0)
HEMOGLOBIN: 13.3 g/dL (ref 11.7–15.5)
Lymphocytes Relative: 25 %
Lymphs Abs: 1525 cells/uL (ref 850–3900)
MCH: 28.9 pg (ref 27.0–33.0)
MCHC: 31.7 g/dL — ABNORMAL LOW (ref 32.0–36.0)
MCV: 90.9 fL (ref 80.0–100.0)
MPV: 9.7 fL (ref 7.5–12.5)
Monocytes Absolute: 671 cells/uL (ref 200–950)
Monocytes Relative: 11 %
NEUTROS ABS: 3599 {cells}/uL (ref 1500–7800)
Neutrophils Relative %: 59 %
Platelets: 274 10*3/uL (ref 140–400)
RBC: 4.61 MIL/uL (ref 3.80–5.10)
RDW: 13.7 % (ref 11.0–15.0)
WBC: 6.1 10*3/uL (ref 3.8–10.8)

## 2017-06-04 LAB — IRON AND TIBC
%SAT: 9 % — AB (ref 11–50)
Iron: 39 ug/dL — ABNORMAL LOW (ref 45–160)
TIBC: 425 ug/dL (ref 250–450)
UIBC: 386 ug/dL

## 2017-06-04 LAB — BASIC METABOLIC PANEL WITH GFR
BUN: 15 mg/dL (ref 7–25)
CO2: 22 mmol/L (ref 20–31)
CREATININE: 0.72 mg/dL (ref 0.50–1.05)
Calcium: 9.1 mg/dL (ref 8.6–10.4)
Chloride: 105 mmol/L (ref 98–110)
GFR, Est African American: >89 mL/min (ref >=60)
GFR, Est Non African American: >89 mL/min (ref >=60)
Glucose, Bld: 86 mg/dL (ref 65–99)
POTASSIUM: 4.4 mmol/L (ref 3.5–5.3)
SODIUM: 139 mmol/L (ref 135–146)

## 2017-06-04 LAB — HEPATIC FUNCTION PANEL
ALK PHOS: 65 U/L (ref 33–130)
ALT: 29 U/L (ref 6–29)
AST: 29 U/L (ref 10–35)
Albumin: 3.9 g/dL (ref 3.6–5.1)
BILIRUBIN DIRECT: 0.1 mg/dL (ref <=0.2)
Indirect Bilirubin: 0.4 mg/dL (ref 0.2–1.2)
Total Bilirubin: 0.5 mg/dL (ref 0.2–1.2)
Total Protein: 6.2 g/dL (ref 6.1–8.1)

## 2017-06-04 LAB — TSH: TSH: 1.95 m[IU]/L

## 2017-06-04 LAB — FERRITIN: FERRITIN: 12 ng/mL (ref 10–232)

## 2017-06-04 LAB — VITAMIN B12: Vitamin B-12: 465 pg/mL (ref 200–1100)

## 2017-08-09 ENCOUNTER — Other Ambulatory Visit: Payer: Self-pay | Admitting: Physician Assistant

## 2017-08-09 DIAGNOSIS — F900 Attention-deficit hyperactivity disorder, predominantly inattentive type: Secondary | ICD-10-CM

## 2017-08-09 DIAGNOSIS — Z0001 Encounter for general adult medical examination with abnormal findings: Secondary | ICD-10-CM

## 2017-08-09 MED ORDER — AMPHETAMINE-DEXTROAMPHET ER 20 MG PO CP24: 20.0000 mg | ORAL_CAPSULE | ORAL | 0 refills | Status: DC

## 2017-09-30 ENCOUNTER — Ambulatory Visit (INDEPENDENT_AMBULATORY_CARE_PROVIDER_SITE_OTHER): Payer: BLUE CROSS/BLUE SHIELD | Admitting: Adult Health

## 2017-09-30 ENCOUNTER — Ambulatory Visit (HOSPITAL_COMMUNITY)
Admission: RE | Admit: 2017-09-30 | Discharge: 2017-09-30 | Disposition: A | Discharge status: Home / Self Care | Payer: BLUE CROSS/BLUE SHIELD | Source: Ambulatory Visit | Attending: Adult Health | Admitting: Adult Health

## 2017-09-30 ENCOUNTER — Encounter: Payer: Self-pay | Admitting: Adult Health

## 2017-09-30 VITALS — BP 138/82 | HR 96 | Temp 97.5°F | Resp 18 | Ht 61.0 in | Wt 157.8 lb

## 2017-09-30 DIAGNOSIS — R05 Cough: Secondary | ICD-10-CM | POA: Diagnosis not present

## 2017-09-30 DIAGNOSIS — R0602 Shortness of breath: Secondary | ICD-10-CM | POA: Insufficient documentation

## 2017-09-30 DIAGNOSIS — R079 Chest pain, unspecified: Secondary | ICD-10-CM | POA: Diagnosis not present

## 2017-09-30 DIAGNOSIS — K2100 Gastro-esophageal reflux disease with esophagitis, without bleeding: Secondary | ICD-10-CM

## 2017-09-30 DIAGNOSIS — F900 Attention-deficit hyperactivity disorder, predominantly inattentive type: Secondary | ICD-10-CM | POA: Diagnosis not present

## 2017-09-30 DIAGNOSIS — R062 Wheezing: Secondary | ICD-10-CM | POA: Diagnosis not present

## 2017-09-30 MED ORDER — OMEPRAZOLE 20 MG PO CPDR: 20.0000 mg | DELAYED_RELEASE_CAPSULE | Freq: Two times a day (BID) | ORAL | 0 refills | Status: DC

## 2017-09-30 MED ORDER — SUCRALFATE 1 G PO TABS: 1.0000 g | ORAL_TABLET | Freq: Four times a day (QID) | ORAL | 0 refills | Status: DC

## 2017-09-30 MED ORDER — AMPHETAMINE-DEXTROAMPHET ER 20 MG PO CP24: 20.0000 mg | ORAL_CAPSULE | ORAL | 0 refills | Status: DC

## 2017-09-30 NOTE — Patient Instructions (Signed)
Esophagitis Esophagitis is inflammation of the esophagus. The esophagus is the tube that carries food and liquids from your mouth to your stomach. Esophagitis can cause soreness or pain in the esophagus. This condition can make it difficult and painful to swallow. What are the causes? Most causes of esophagitis are not serious. Common causes of this condition include:  Gastroesophageal reflux disease (GERD). This is when stomach contents move back up into the esophagus (reflux).  Repeated vomiting.  An allergic-type reaction, especially caused by food allergies (eosinophilic esophagitis).  Injury to the esophagus by swallowing large pills with or without water, or swallowing certain types of medicines.  Swallowing (ingesting) harmful chemicals, such as household cleaning products.  Heavy alcohol use.  An infection of the esophagus.This most often occurs in people who have a weakened immune system.  Radiation or chemotherapy treatment for cancer.  Certain diseases such as sarcoidosis, Crohn disease, and scleroderma.  What are the signs or symptoms? Symptoms of this condition include:  Difficult or painful swallowing.  Pain with swallowing acidic liquids, such as citrus juices.  Pain with burping.  Chest pain.  Difficulty breathing.  Nausea.  Vomiting.  Pain in the abdomen.  Weight loss.  Ulcers in the mouth.  Patches of white material in the mouth (candidiasis).  Fever.  Coughing up blood or vomiting blood.  Stool that is black, tarry, or bright red.  How is this diagnosed? Your health care provider will take a medical history and perform a physical exam. You may also have other tests, including:  An endoscopy to examine your stomach and esophagus with a small camera.  A test that measures the acidity level in your esophagus.  A test that measures how much pressure is on your esophagus.  A barium swallow or modified barium swallow to show the shape,  size, and functioning of your esophagus.  Allergy tests.  How is this treated? Treatment for this condition depends on the cause of your esophagitis. In some cases, steroids or other medicines may be given to help relieve your symptoms or to treat the underlying cause of your condition. You may have to make some lifestyle changes, such as:  Avoiding alcohol.  Quitting smoking.  Changing your diet.  Exercising.  Changing your sleep habits and your sleep environment.  Follow these instructions at home: Take these actions to decrease your discomfort and to help avoid complications. Diet  Follow a diet as recommended by your health care provider. This may involve avoiding foods and drinks such as: ? Coffee and tea (with or without caffeine). ? Drinks that contain alcohol. ? Energy drinks and sports drinks. ? Carbonated drinks or sodas. ? Chocolate and cocoa. ? Peppermint and mint flavorings. ? Garlic and onions. ? Horseradish. ? Spicy and acidic foods, including peppers, chili powder, curry powder, vinegar, hot sauces, and barbecue sauce. ? Citrus fruit juices and citrus fruits, such as oranges, lemons, and limes. ? Tomato-based foods, such as red sauce, chili, salsa, and pizza with red sauce. ? Fried and fatty foods, such as donuts, french fries, potato chips, and high-fat dressings. ? High-fat meats, such as hot dogs and fatty cuts of red and white meats, such as rib eye steak, sausage, ham, and bacon. ? High-fat dairy items, such as whole milk, butter, and cream cheese.  Eat small, frequent meals instead of large meals.  Avoid drinking large amounts of liquid with your meals.  Avoid eating meals during the 2-3 hours before bedtime.  Avoid lying down right  after you eat.  Do not exercise right after you eat.  Avoid foods and drinks that seem to make your symptoms worse. General instructions  Pay attention to any changes in your symptoms.  Take over-the-counter and  prescription medicines only as told by your health care provider. Do not take aspirin, ibuprofen, or other NSAIDs unless your health care provider told you to do so.  If you have trouble taking pills, use a pill splitter to decrease the size of the pill. This will decrease the chance of the pill getting stuck or injuring your esophagus on the way down. Also, drink water after you take a pill.  Do not use any tobacco products, including cigarettes, chewing tobacco, and e-cigarettes. If you need help quitting, ask your health care provider.  Wear loose-fitting clothing. Do not wear anything tight around your waist that causes pressure on your abdomen.  Raise (elevate) the head of your bed about 6 inches (15 cm).  Try to reduce your stress, such as with yoga or meditation. If you need help reducing stress, ask your health care provider.  If you are overweight, reduce your weight to an amount that is healthy for you. Ask your health care provider for guidance about a safe weight loss goal.  Keep all follow-up visits as told by your health care provider. This is important. Contact a health care provider if:  You have new symptoms.  You have unexplained weight loss.  You have difficulty swallowing, or it hurts to swallow.  You have wheezing or a persistent cough.  Your symptoms do not improve with treatment.  You have frequent heartburn for more than two weeks. Get help right away if:  You have severe pain in your arms, neck, jaw, teeth, or back.  You feel sweaty, dizzy, or light-headed.  You have chest pain or shortness of breath.  You vomit and your vomit looks like blood or coffee grounds.  Your stool is bloody or black.  You have a fever.  You cannot swallow, drink, or eat. This information is not intended to replace advice given to you by your health care provider. Make sure you discuss any questions you have with your health care provider. Document Released: 01/07/2005  Document Revised: 05/07/2016 Document Reviewed: 03/27/2015 Elsevier Interactive Patient Education  [employer].

## 2017-09-30 NOTE — Progress Notes (Addendum)
Assessment and Plan:  Emily Graham was seen today for acute visit and cough.  Diagnoses and all orders for this visit:  Gastroesophageal reflux disease with esophagitis -     omeprazole (PRILOSEC) 20 MG capsule; Take 1 capsule (20 mg total) by mouth 2 (two) times daily. Discussed taper. -     sucralfate (CARAFATE) 1 g tablet; Take 1 tablet (1 g total) by mouth 4 (four) times daily. Dissolve tablet in small amount of water to coat esophagus.   Shortness of breath -     DG Chest 2 View; Future to rule out aspiration  Attention deficit hyperactivity disorder (ADHD), predominantly inattentive type -     amphetamine-dextroamphetamine (ADDERALL XR) 20 MG 24 hr capsule; Take 1 capsule (20 mg total) by mouth every morning.  Patient to call if symptoms not significantly improving after the weekend; instructed to present to the ER for CP/SOB, nausea, severe vomiting, blood in emesis, weakness, syncope.   Further disposition pending results of labs. Discussed med's effects and SE's.   Over 30 minutes of exam, counseling, chart review, and critical decision making was performed.   Future Appointments Date Time Provider [place]  12/02/2017 4:00 PM Unk Pinto, MD [facility] None    ------------------------------------------------------------------------------------------------------------------   HPI BP 138/82   Pulse 96   Temp (!) 97.5 F (36.4 C)   Resp 18   Ht 5' 1"  (1.549 m)   Wt 157 lb 12.8 oz (71.6 kg)   SpO2 98%   BMI 29.82 kg/m   56 y.o.female presents for a cough x 1 week; she reports this began as an episode of acid reflux that she experienced after eating out at [employer] much later than she would normally- later than night she experienced severe acid reflux and a burning sensation throughout her chest, and had a single episode of emesis. She has since had a constant retrosternal burning sensation, taste of bile and a frequent cough and hoarseness that is worsening and  has lead to some SOB/wheezing sensation. She has been taking ranitidine 300 mg BID as prescribed but has not tried any other interventions.   The patient also shares that she has a history of a severe episode of reflux with aspiration that lead to her having a chest tube placed. She is concerned that this episode may have caused something similar. Discussed CXR and she would like to proceed with this today.   Patient is on an ADD medication and requesting refill today, she states that the medication is helping her to focus at work. she denies any adverse reactions- anxiety, appetitive issues, insomnia.    Past Medical History:  Diagnosis Date  . ADD (attention deficit disorder) 07/19/2013  . Arthritis   . Cholecystitis, acute with cholelithiasis 07/19/2013  . Chronic left shoulder pain 07/19/2013  . Dyspnea   . GERD (gastroesophageal reflux disease) 07/19/2013  . Goiter   . HTN (hypertension)   . Hypoglycemia   . Hypoglycemia 07/19/2013  . Murmur   . Pre-diabetes   . Seizure disorder, primary generalized ([place]) 07/19/2013   May be related to hypoglycemia  . Seizures ([place]) 2013   grand mal due to hypoglycemia  . Sleep apnea      No Known Allergies  Current Outpatient Prescriptions on File Prior to Visit  Medication Sig  . Arginine 500 MG CAPS Take 500 mg by mouth daily.  Marland Kitchen aspirin 81 MG tablet Take 81 mg by mouth daily.  Marland Kitchen b complex vitamins tablet Take 1 tablet by  mouth daily.  . Cholecalciferol (VITAMIN D3) 50000 units CAPS Take daily or as directed  . milk thistle 175 MG tablet Take 175 mg by mouth daily.  . Multiple Vitamin (MULTIVITAMIN) tablet Take 1 tablet by mouth daily.  . NON FORMULARY Deep Blue Doterra Oil  . NON FORMULARY DDR, cellular complex  . NON FORMULARY Slim and Sassy, metabolic blend  . NON FORMULARY Zendocrine  . NON FORMULARY Mitro 2 Max  . NON FORMULARY XEO Mega, omega complex  . NON FORMULARY Tri Ease Seasonal Blend  . NON FORMULARY Bone Nutrient  . NON  FORMULARY Keratin  . NON FORMULARY Collagen Forensic psychologist  . Omega-3 Fatty Acids (FISH OIL) 1200 MG CAPS Take 1 capsule by mouth daily.  . polycarbophil (FIBERCON) 625 MG tablet Take 625 mg by mouth daily.  . Vitamin D, Ergocalciferol, (DRISDOL) 50000 units CAPS capsule TAKE 1 CAPSULE BY MOUTH DAILY  . ranitidine (ZANTAC) 300 MG tablet Take twice a day for heartburn & indigestion   No current facility-administered medications on file prior to visit.     ROS: Review of Systems  Constitutional: Negative.  Negative for diaphoresis, malaise/fatigue and weight loss.  HENT: Positive for sore throat.   Eyes: Negative for blurred vision and double vision.  Respiratory: Positive for cough, shortness of breath and wheezing. Negative for hemoptysis and sputum production.   Cardiovascular: Negative for chest pain, palpitations and orthopnea.  Gastrointestinal: Positive for heartburn, nausea and vomiting. Negative for abdominal pain, blood in stool, constipation, diarrhea and melena.  Genitourinary: Negative.   Musculoskeletal: Negative.   Skin: Negative.   Neurological: Negative for dizziness, sensory change, focal weakness, weakness and headaches.  Endo/Heme/Allergies: Negative.   Psychiatric/Behavioral: Negative.  The patient is not nervous/anxious and does not have insomnia.     Physical Exam:  BP 138/82   Pulse 96   Temp (!) 97.5 F (36.4 C)   Resp 18   Ht 5' 1"  (1.549 m)   Wt 157 lb 12.8 oz (71.6 kg)   SpO2 98%   BMI 29.82 kg/m   General Appearance: Well nourished, coughing frequently, in no acute distress. Eyes: PERRLA,  conjunctiva no swelling or erythema ENT/Mouth: Posterior pharynx injected without swelling, or exudate. Uvula midline. Tonsils not swollen or erythematous. Hearing normal.  Neck: Supple, thyroid normal.  Respiratory: Respiratory effort normal, but interrupted by frequent coughing with attempts at deep breaths, breath sounds present throughout, some scattered  rales without rhonchi/wheezing/stridor.  Cardio: RRR with no MRGs. Brisk peripheral pulses without edema.  Abdomen: Soft, + BS.  + epigastric tenderness, no guarding, rebound, palpable hernias or masses. Lymphatics: Non tender without lymphadenopathy.  Neuro:  Normal muscle tone, no cerebellar symptoms. Psych: Awake and oriented X 3, normal affect, Insight and Judgment appropriate.     Izora Ribas, NP 2:44 PM [facility]

## 2017-12-01 ENCOUNTER — Encounter: Payer: Self-pay | Admitting: Internal Medicine

## 2017-12-02 ENCOUNTER — Ambulatory Visit (INDEPENDENT_AMBULATORY_CARE_PROVIDER_SITE_OTHER): Payer: BLUE CROSS/BLUE SHIELD | Admitting: Internal Medicine

## 2017-12-02 VITALS — BP 132/84 | HR 80 | Temp 97.4°F | Resp 16 | Ht 64.25 in | Wt 157.4 lb

## 2017-12-02 DIAGNOSIS — Z79899 Other long term (current) drug therapy: Secondary | ICD-10-CM | POA: Diagnosis not present

## 2017-12-02 DIAGNOSIS — Z0001 Encounter for general adult medical examination with abnormal findings: Secondary | ICD-10-CM

## 2017-12-02 DIAGNOSIS — K7581 Nonalcoholic steatohepatitis (NASH): Secondary | ICD-10-CM

## 2017-12-02 DIAGNOSIS — Z136 Encounter for screening for cardiovascular disorders: Secondary | ICD-10-CM | POA: Diagnosis not present

## 2017-12-02 DIAGNOSIS — R5383 Other fatigue: Secondary | ICD-10-CM

## 2017-12-02 DIAGNOSIS — Z1211 Encounter for screening for malignant neoplasm of colon: Secondary | ICD-10-CM

## 2017-12-02 DIAGNOSIS — R7303 Prediabetes: Secondary | ICD-10-CM

## 2017-12-02 DIAGNOSIS — E559 Vitamin D deficiency, unspecified: Secondary | ICD-10-CM

## 2017-12-02 DIAGNOSIS — Z111 Encounter for screening for respiratory tuberculosis: Secondary | ICD-10-CM

## 2017-12-02 DIAGNOSIS — F988 Other specified behavioral and emotional disorders with onset usually occurring in childhood and adolescence: Secondary | ICD-10-CM

## 2017-12-02 DIAGNOSIS — Z0000 Encounter for general adult medical examination without abnormal findings: Secondary | ICD-10-CM

## 2017-12-02 DIAGNOSIS — K219 Gastro-esophageal reflux disease without esophagitis: Secondary | ICD-10-CM

## 2017-12-02 DIAGNOSIS — I10 Essential (primary) hypertension: Secondary | ICD-10-CM

## 2017-12-02 DIAGNOSIS — E782 Mixed hyperlipidemia: Secondary | ICD-10-CM

## 2017-12-02 DIAGNOSIS — Z1212 Encounter for screening for malignant neoplasm of rectum: Secondary | ICD-10-CM

## 2017-12-02 NOTE — Progress Notes (Signed)
[facility] Unk Pinto, M.D.     Uvaldo Bristle. Silverio Lay, P.A.-C Liane Comber, [facility] [address] [place] [address] Telephone [phone] Telefax [phone] Annual Screening/Preventative Visit & Comprehensive Evaluation &  Examination     This very nice 56 y.o. MWF presents for a Screening/Preventative Visit & comprehensive evaluation and management of multiple medical co-morbidities.  Patient has been followed for HTN, Prediabetes, Hyperlipidemia and Vitamin D Deficiency. Patient also has been followed by Dr Mina Marble for EDSI's for chronic LBP from Lumbar DDD. She also has GERD apparently off of PPI's and controlled with Ranitidine.       Patient has been monitored for labile HTN expectantly since 2000.  Patient's BP has been controlled at home and patient denies any cardiac symptoms as chest pain, palpitations, shortness of breath, dizziness or ankle swelling. Today's BP is at goal - 132/84.      Patient's hyperlipidemia is controlled with diet and medications. Patient denies myalgias or other medication SE's. Last lipids were at goal: Lab Results  Component Value Date   CHOL 190 12/02/2017   HDL 75 12/02/2017   LDLCALC 90 11/30/2016   TRIG 90 12/02/2017   CHOLHDL 2.5 12/02/2017      Patient has hx/o reactive hypoglycemia  developing post bariatric Gastric Bipass in 2000. Patient denies recent reactive hypoglycemic symptoms, visual blurring, diabetic polys, or paresthesias. Last A1c was Normal at goal: Lab Results  Component Value Date   HGBA1C 5.0 12/02/2017      Finally, patient has history of Vitamin D Deficiency ("27" / 2009)  and last Vitamin D was at goal: Lab Results  Component Value Date   VD25OH 66 12/02/2017   Current Outpatient Medications on File Prior to Visit  Medication Sig  . Arginine 500 MG CAPS Take 500 mg by mouth daily.  Marland Kitchen aspirin 81 MG tablet Take 81 mg by mouth daily.   Marland Kitchen b complex vitamins tablet Take 1 tablet by mouth daily.  . milk thistle 175 MG tablet Take 175 mg by mouth daily.  . Multiple Vitamin (MULTIVITAMIN) tablet Take 1 tablet by mouth daily.  . NON FORMULARY Deep Blue Doterra Oil  . NON FORMULARY DDR, cellular complex  . NON FORMULARY Slim and Sassy, metabolic blend  . NON FORMULARY Zendocrine  . NON FORMULARY Mitro 2 Max  . NON FORMULARY XEO Mega, omega complex  . NON FORMULARY Tri Ease Seasonal Blend  . NON FORMULARY Bone Nutrient  . NON FORMULARY Keratin  . NON FORMULARY Collagen Forensic psychologist  . Omega-3 Fatty Acids (FISH OIL) 1200 MG CAPS Take 1 capsule by mouth daily.  . polycarbophil (FIBERCON) 625 MG tablet Take 625 mg by mouth daily.  . Vitamin D, Ergocalciferol, (DRISDOL) 50000 units CAPS capsule TAKE 1 CAPSULE BY MOUTH DAILY  . amphetamine-dextroamphetamine (ADDERALL XR) 20 MG 24 hr capsule Take 1 capsule (20 mg total) by mouth every morning.  . ranitidine (ZANTAC) 300 MG tablet Take twice a day for heartburn & indigestion   No current facility-administered medications on file prior to visit.    No Known Allergies Past Medical History:  Diagnosis Date  . ADD (attention deficit disorder) 07/19/2013  . Arthritis   . Cholecystitis, acute with cholelithiasis 07/19/2013  . Chronic left shoulder pain 07/19/2013  . Dyspnea   . GERD (gastroesophageal reflux disease) 07/19/2013  . Goiter   . HTN (hypertension)   . Hypoglycemia   . Hypoglycemia 07/19/2013  . Murmur   .  Pre-diabetes   . Seizure disorder, primary generalized ([place]) 07/19/2013   May be related to hypoglycemia  . Seizures ([place]) 2013   grand mal due to hypoglycemia  . Sleep apnea    Health Maintenance  Topic Date Due  . PAP SMEAR  10/02/1981  . COLONOSCOPY  10/02/2010  . MAMMOGRAM  10/16/2016  . INFLUENZA VACCINE  07/14/2017  . TETANUS/TDAP  11/07/2019  . Hepatitis C Screening  Completed  . HIV Screening  Completed   Immunization History  Administered Date(s)  Administered  . Influenza-Unspecified 09/15/2016  . PPD Test 11/30/2016  . Pneumococcal Polysaccharide-23 12/14/1998  . Tdap 11/06/2009   Last Colon -  Last Pap -  Past Surgical History:  Procedure Laterality Date  . ABDOMINAL HYSTERECTOMY    . BREAST ENHANCEMENT SURGERY    . CHOLECYSTECTOMY N/A 07/20/2013   Procedure: LAPAROSCOPIC CHOLECYSTECTOMY WITH INTRAOPERATIVE CHOLANGIOGRAM;  Surgeon: Shann Medal, MD;  Location: [facility];  Service: General;  Laterality: N/A;  . GASTRIC BYPASS    . GASTRIC BYPASS    . GASTRIC BYPASS  2000  . Stomach tuck    . TOTAL SHOULDER REPLACEMENT     Family History  Problem Relation Age of Onset  . Coronary artery disease Father        MI at age 41  . Heart disease Father   . Hypertension Father   . Diabetes Father   . Skin cancer Mother   . Hypertension Mother   . Hyperlipidemia Mother   . Breast cancer Maternal Aunt   . Breast cancer Paternal Aunt   . Breast cancer Paternal Uncle   . Cancer Maternal Aunt   . Cancer Cousin   . Colon cancer Neg Hx    Social History   Tobacco Use  . Smoking status: Never Smoker  . Smokeless tobacco: Never Used  Substance Use Topics  . Alcohol use: Yes    Alcohol/week: 0.5 oz    Types: 1 Standard drinks or equivalent per week    Comment: Occasional  . Drug use: No    ROS Constitutional: Denies fever, chills, weight loss/gain, headaches, insomnia,  night sweats, and change in appetite. Does c/o fatigue. Eyes: Denies redness, blurred vision, diplopia, discharge, itchy, watery eyes.  ENT: Denies discharge, congestion, post nasal drip, epistaxis, sore throat, earache, hearing loss, dental pain, Tinnitus, Vertigo, Sinus pain, snoring.  Cardio: Denies chest pain, palpitations, irregular heartbeat, syncope, dyspnea, diaphoresis, orthopnea, PND, claudication, edema Respiratory: denies cough, dyspnea, DOE, pleurisy, hoarseness, laryngitis, wheezing.  Gastrointestinal: Denies dysphagia, heartburn, reflux, water  brash, pain, cramps, nausea, vomiting, bloating, diarrhea, constipation, hematemesis, melena, hematochezia, jaundice, hemorrhoids Genitourinary: Denies dysuria, frequency, urgency, nocturia, hesitancy, discharge, hematuria, flank pain Breast: Breast lumps, nipple discharge, bleeding.  Musculoskeletal: Denies arthralgia, myalgia, stiffness, Jt. Swelling, pain, limp, and strain/sprain. Denies falls. Skin: Denies puritis, rash, hives, warts, acne, eczema, changing in skin lesion Neuro: No weakness, tremor, incoordination, spasms, paresthesia, pain Psychiatric: Denies confusion, memory loss, sensory loss. Denies Depression. Endocrine: Denies change in weight, skin, hair change, nocturia, and paresthesia, diabetic polys, visual blurring, hyper / hypo glycemic episodes.  Heme/Lymph: No excessive bleeding, bruising, enlarged lymph nodes.  Physical Exam  BP 132/84   Pulse 80   Temp (!) 97.4 F (36.3 C)   Resp 16   Ht 5' 4.25" (1.632 m)   Wt 157 lb 6.4 oz (71.4 kg)   BMI 26.81 kg/m   General Appearance: Well nourished, well groomed and in no apparent distress.  Eyes: PERRLA, EOMs,  conjunctiva no swelling or erythema, normal fundi and vessels. Sinuses: No frontal/maxillary tenderness ENT/Mouth: EACs patent / TMs  nl. Nares clear without erythema, swelling, mucoid exudates. Oral hygiene is good. No erythema, swelling, or exudate. Tongue normal, non-obstructing. Tonsils not swollen or erythematous. Hearing normal.  Neck: Supple, thyroid normal. No bruits, nodes or JVD. Respiratory: Respiratory effort normal.  BS equal and clear bilateral without rales, rhonci, wheezing or stridor. Cardio: Heart sounds are normal with regular rate and rhythm and no murmurs, rubs or gallops. Peripheral pulses are normal and equal bilaterally without edema. No aortic or femoral bruits. Chest: symmetric with normal excursions and percussion. Breasts: Symmetric, without lumps, nipple discharge, retractions, or  fibrocystic changes.  Abdomen: Flat, soft with bowel sounds active. Nontender, no guarding, rebound, hernias, masses, or organomegaly.  Lymphatics: Non tender without lymphadenopathy.  Genitourinary:  Musculoskeletal: Full ROM all peripheral extremities, joint stability, 5/5 strength, and normal gait. Skin: Warm and dry without rashes, lesions, cyanosis, clubbing or  ecchymosis.  Neuro: Cranial nerves intact, reflexes equal bilaterally. Normal muscle tone, no cerebellar symptoms. Sensation intact.  Pysch: Alert and oriented X 3, normal affect, Insight and Judgment appropriate.   Assessment and Plan  1. Annual Preventative Screening Examination  2. Essential hypertension  - EKG 12-Lead - [place], RETROPERITNL ABD,  LTD - Urinalysis, Routine w reflex microscopic - Microalbumin / creatinine urine ratio - CBC with Differential/Platelet - BASIC METABOLIC PANEL WITH GFR - Magnesium - TSH  3. Hyperlipidemia, mixed  - EKG 12-Lead - [place], RETROPERITNL ABD,  LTD - Hepatic function panel - Lipid panel - TSH  4. Prediabetes  - EKG 12-Lead - [place], RETROPERITNL ABD,  LTD - Hemoglobin A1c - Insulin, random  5. Vitamin D deficiency  - VITAMIN D 25 Hydroxy   6. Gastroesophageal reflux disease, esophagitis presence not specified  - CBC with Differential/Platelet  7. Attention deficit disorder, unspecified hyperactivity presence   8. NASH (nonalcoholic steatohepatitis)  - Hepatic function panel  9. Screening for colorectal cancer  - POC Hemoccult Bld/Stl   10. Screening for ischemic heart disease  - EKG 12-Lead  11. Screening for AAA (aortic abdominal aneurysm)  - [place], RETROPERITNL ABD,  LTD  12. Screening examination for pulmonary tuberculosis   13. Fatigue  - Iron,Total/Total Iron Binding Cap - Vitamin B12 - CBC with Differential/Platelet  14. Medication management  - Urinalysis, Routine w reflex microscopic - Microalbumin / creatinine urine ratio - CBC with  Differential/Platelet - BASIC METABOLIC PANEL WITH GFR - Hepatic function panel - Magnesium - Lipid panel - TSH - Hemoglobin A1c - Insulin, random - VITAMIN D 25 Hydroxy       Patient was counseled in prudent diet to achieve/maintain BMI less than 25 for weight control, BP monitoring, regular exercise and medications. Discussed med's effects and SE's. Screening labs and tests as requested with regular follow-up as recommended. Over 40 minutes of exam, counseling, chart review and high complex critical decision making was performed.

## 2017-12-02 NOTE — Patient Instructions (Signed)
Preventive Care for Adults  A healthy lifestyle and preventive care can promote health and wellness. Preventive health guidelines for women include the following key practices.  A routine yearly physical is a good way to check with your health care provider about your health and preventive screening. It is a chance to share any concerns and updates on your health and to receive a thorough exam.  Visit your dentist for a routine exam and preventive care every 6 months. Brush your teeth twice a day and floss once a day. Good oral hygiene prevents tooth decay and gum disease.  The frequency of eye exams is based on your age, health, family medical history, use of contact lenses, and other factors. Follow your health care provider's recommendations for frequency of eye exams.  Eat a healthy diet. Foods like vegetables, fruits, whole grains, low-fat dairy products, and lean protein foods contain the nutrients you need without too many calories. Decrease your intake of foods high in solid fats, added sugars, and salt. Eat the right amount of calories for you.Get information about a proper diet from your health care provider, if necessary.  Regular physical exercise is one of the most important things you can do for your health. Most adults should get at least 150 minutes of moderate-intensity exercise (any activity that increases your heart rate and causes you to sweat) each week. In addition, most adults need muscle-strengthening exercises on 2 or more days a week.  Maintain a healthy weight. The body mass index (BMI) is a screening tool to identify possible weight problems. It provides an estimate of body fat based on height and weight. Your health care provider can find your BMI and can help you achieve or maintain a healthy weight.For adults 20 years and older:  A BMI below 18.5 is considered underweight.  A BMI of 18.5 to 24.9 is normal.  A BMI of 25 to 29.9 is considered overweight.  A BMI of  30 and above is considered obese.  Maintain normal blood lipids and cholesterol levels by exercising and minimizing your intake of saturated fat. Eat a balanced diet with plenty of fruit and vegetables. Blood tests for lipids and cholesterol should begin at age 54 and be repeated every 5 years. If your lipid or cholesterol levels are high, you are over 50, or you are at high risk for heart disease, you may need your cholesterol levels checked more frequently.Ongoing high lipid and cholesterol levels should be treated with medicines if diet and exercise are not working.  If you smoke, find out from your health care provider how to quit. If you do not use tobacco, do not start.  Lung cancer screening is recommended for adults aged 58-80 years who are at high risk for developing lung cancer because of a history of smoking. A yearly low-dose CT scan of the lungs is recommended for people who have at least a 30-pack-year history of smoking and are a current smoker or have quit within the past 15 years. A pack year of smoking is smoking an average of 1 pack of cigarettes a day for 1 year (for example: 1 pack a day for 30 years or 2 packs a day for 15 years). Yearly screening should continue until the smoker has stopped smoking for at least 15 years. Yearly screening should be stopped for people who develop a health problem that would prevent them from having lung cancer treatment.  High blood pressure causes heart disease and increases the risk of  stroke. Your blood pressure should be checked at least every 1 to 2 years. Ongoing high blood pressure should be treated with medicines if weight loss and exercise do not work.  If you are 55-79 years old, ask your health care provider if you should take aspirin to prevent strokes.  Diabetes screening involves taking a blood sample to check your fasting blood sugar level. This should be done once every 3 years, after age 45, if you are within normal weight and  without risk factors for diabetes. Testing should be considered at a younger age or be carried out more frequently if you are overweight and have at least 1 risk factor for diabetes.  Breast cancer screening is essential preventive care for women. You should practice "breast self-awareness." This means understanding the normal appearance and feel of your breasts and may include breast self-examination. Any changes detected, no matter how small, should be reported to a health care provider. Women in their 20s and 30s should have a clinical breast exam (CBE) by a health care provider as part of a regular health exam every 1 to 3 years. After age 40, women should have a CBE every year. Starting at age 40, women should consider having a mammogram (breast X-ray test) every year. Women who have a family history of breast cancer should talk to their health care provider about genetic screening. Women at a high risk of breast cancer should talk to their health care providers about having an MRI and a mammogram every year.  Breast cancer gene (BRCA)-related cancer risk assessment is recommended for women who have family members with BRCA-related cancers. BRCA-related cancers include breast, ovarian, tubal, and peritoneal cancers. Having family members with these cancers may be associated with an increased risk for harmful changes (mutations) in the breast cancer genes BRCA1 and BRCA2. Results of the assessment will determine the need for genetic counseling and BRCA1 and BRCA2 testing.  Routine pelvic exams to screen for cancer are no longer recommended for nonpregnant women who are considered low risk for cancer of the pelvic organs (ovaries, uterus, and vagina) and who do not have symptoms. Ask your health care provider if a screening pelvic exam is right for you.  If you have had past treatment for cervical cancer or a condition that could lead to cancer, you need Pap tests and screening for cancer for at least 20  years after your treatment. If Pap tests have been discontinued, your risk factors (such as having a new sexual partner) need to be reassessed to determine if screening should be resumed. Some women have medical problems that increase the chance of getting cervical cancer. In these cases, your health care provider may recommend more frequent screening and Pap tests.  Colorectal cancer can be detected and often prevented. Most routine colorectal cancer screening begins at the age of 50 years and continues through age 75 years. However, your health care provider may recommend screening at an earlier age if you have risk factors for colon cancer. On a yearly basis, your health care provider may provide home test kits to check for hidden blood in the stool. Use of a small camera at the end of a tube, to directly examine the colon (sigmoidoscopy or colonoscopy), can detect the earliest forms of colorectal cancer. Talk to your health care provider about this at age 50, when routine screening begins. Direct exam of the colon should be repeated every 5-10 years through age 75 years, unless early forms of pre-cancerous   polyps or small growths are found.  Hepatitis C blood testing is recommended for all people born from [birth year] through 1965 and any individual with known risks for hepatitis C.  Pra  Osteoporosis is a disease in which the bones lose minerals and strength with aging. This can result in serious bone fractures or breaks. The risk of osteoporosis can be identified using a bone density scan. Women ages 59 years and over and women at risk for fractures or osteoporosis should discuss screening with their health care providers. Ask your health care provider whether you should take a calcium supplement or vitamin D to reduce the rate of osteoporosis.  Menopause can be associated with physical symptoms and risks. Hormone replacement therapy is available to decrease symptoms and risks. You should talk to your  health care provider about whether hormone replacement therapy is right for you.  Use sunscreen. Apply sunscreen liberally and repeatedly throughout the day. You should seek shade when your shadow is shorter than you. Protect yourself by wearing long sleeves, pants, a wide-brimmed hat, and sunglasses year round, whenever you are outdoors.  Once a month, do a whole body skin exam, using a mirror to look at the skin on your back. Tell your health care provider of new moles, moles that have irregular borders, moles that are larger than a pencil eraser, or moles that have changed in shape or color.  Stay current with required vaccines (immunizations).  Influenza vaccine. All adults should be immunized every year.  Tetanus, diphtheria, and acellular pertussis (Td, Tdap) vaccine. Pregnant women should receive 1 dose of Tdap vaccine during each pregnancy. The dose should be obtained regardless of the length of time since the last dose. Immunization is preferred during the 27th-36th week of gestation. An adult who has not previously received Tdap or who does not know her vaccine status should receive 1 dose of Tdap. This initial dose should be followed by tetanus and diphtheria toxoids (Td) booster doses every 10 years. Adults with an unknown or incomplete history of completing a 3-dose immunization series with Td-containing vaccines should begin or complete a primary immunization series including a Tdap dose. Adults should receive a Td booster every 10 years.  Varicella vaccine. An adult without evidence of immunity to varicella should receive 2 doses or a second dose if she has previously received 1 dose. Pregnant females who do not have evidence of immunity should receive the first dose after pregnancy. This first dose should be obtained before leaving the health care facility. The second dose should be obtained 4-8 weeks after the first dose.  Human papillomavirus (HPV) vaccine. Females aged 13-26 years  who have not received the vaccine previously should obtain the 3-dose series. The vaccine is not recommended for use in pregnant females. However, pregnancy testing is not needed before receiving a dose. If a female is found to be pregnant after receiving a dose, no treatment is needed. In that case, the remaining doses should be delayed until after the pregnancy. Immunization is recommended for any person with an immunocompromised condition through the age of 3 years if she did not get any or all doses earlier. During the 3-dose series, the second dose should be obtained 4-8 weeks after the first dose. The third dose should be obtained 24 weeks after the first dose and 16 weeks after the second dose.  Zoster vaccine. One dose is recommended for adults aged 19 years or older unless certain conditions are present.  Measles, mumps, and rubella (  MMR) vaccine. Adults born before [birth year] generally are considered immune to measles and mumps. Adults born in [birth year] or later should have 1 or more doses of MMR vaccine unless there is a contraindication to the vaccine or there is laboratory evidence of immunity to each of the three diseases. A routine second dose of MMR vaccine should be obtained at least 28 days after the first dose for students attending postsecondary schools, health care workers, or international travelers. People who received inactivated measles vaccine or an unknown type of measles vaccine during 1963-1967 should receive 2 doses of MMR vaccine. People who received inactivated mumps vaccine or an unknown type of mumps vaccine before 1979 and are at high risk for mumps infection should consider immunization with 2 doses of MMR vaccine. For females of childbearing age, rubella immunity should be determined. If there is no evidence of immunity, females who are not pregnant should be vaccinated. If there is no evidence of immunity, females who are pregnant should delay immunization until after pregnancy.  Unvaccinated health care workers born before [birth year] who lack laboratory evidence of measles, mumps, or rubella immunity or laboratory confirmation of disease should consider measles and mumps immunization with 2 doses of MMR vaccine or rubella immunization with 1 dose of MMR vaccine.  Pneumococcal 13-valent conjugate (PCV13) vaccine. When indicated, a person who is uncertain of her immunization history and has no record of immunization should receive the PCV13 vaccine. An adult aged 39 years or older who has certain medical conditions and has not been previously immunized should receive 1 dose of PCV13 vaccine. This PCV13 should be followed with a dose of pneumococcal polysaccharide (PPSV23) vaccine. The PPSV23 vaccine dose should be obtained at least 8 weeks after the dose of PCV13 vaccine. An adult aged 62 years or older who has certain medical conditions and previously received 1 or more doses of PPSV23 vaccine should receive 1 dose of PCV13. The PCV13 vaccine dose should be obtained 1 or more years after the last PPSV23 vaccine dose.    Pneumococcal polysaccharide (PPSV23) vaccine. When PCV13 is also indicated, PCV13 should be obtained first. All adults aged 67 years and older should be immunized. An adult younger than age 45 years who has certain medical conditions should be immunized. Any person who resides in a nursing home or long-term care facility should be immunized. An adult smoker should be immunized. People with an immunocompromised condition and certain other conditions should receive both PCV13 and PPSV23 vaccines. People with human immunodeficiency virus (HIV) infection should be immunized as soon as possible after diagnosis. Immunization during chemotherapy or radiation therapy should be avoided. Routine use of PPSV23 vaccine is not recommended for American Indians, [place] Natives, or people younger than 65 years unless there are medical conditions that require PPSV23 vaccine. When indicated,  people who have unknown immunization and have no record of immunization should receive PPSV23 vaccine. One-time revaccination 5 years after the first dose of PPSV23 is recommended for people aged 19-64 years who have chronic kidney failure, nephrotic syndrome, asplenia, or immunocompromised conditions. People who received 1-2 doses of PPSV23 before age 23 years should receive another dose of PPSV23 vaccine at age 35 years or later if at least 5 years have passed since the previous dose. Doses of PPSV23 are not needed for people immunized with PPSV23 at or after age 38 years.  Preventive Services / Frequency   Ages 43 to 86 years  Blood pressure check.  Lipid and cholesterol check.  Lung  cancer screening. / Every year if you are aged 10-80 years and have a 30-pack-year history of smoking and currently smoke or have quit within the past 15 years. Yearly screening is stopped once you have quit smoking for at least 15 years or develop a health problem that would prevent you from having lung cancer treatment.  Clinical breast exam.** / Every year after age 87 years.  BRCA-related cancer risk assessment.** / For women who have family members with a BRCA-related cancer (breast, ovarian, tubal, or peritoneal cancers).  Mammogram.** / Every year beginning at age 55 years and continuing for as long as you are in good health. Consult with your health care provider.  Pap test.** / Every 3 years starting at age 92 years through age 57 or 45 years with a history of 3 consecutive normal Pap tests.  HPV screening.** / Every 3 years from ages 85 years through ages 60 to 87 years with a history of 3 consecutive normal Pap tests.  Fecal occult blood test (FOBT) of stool. / Every year beginning at age 63 years and continuing until age 77 years. You may not need to do this test if you get a colonoscopy every 10 years.  Flexible sigmoidoscopy or colonoscopy.** / Every 5 years for a flexible sigmoidoscopy or  every 10 years for a colonoscopy beginning at age 71 years and continuing until age 73 years.  Hepatitis C blood test.** / For all people born from [birth year] through 1966 and any individual with known risks for hepatitis C.  Skin self-exam. / Monthly.  Influenza vaccine. / Every year.  Tetanus, diphtheria, and acellular pertussis (Tdap/Td) vaccine.** / Consult your health care provider. Pregnant women should receive 1 dose of Tdap vaccine during each pregnancy. 1 dose of Td every 10 years.  Varicella vaccine.** / Consult your health care provider. Pregnant females who do not have evidence of immunity should receive the first dose after pregnancy.  Zoster vaccine.** / 1 dose for adults aged 50 years or older.  Pneumococcal 13-valent conjugate (PCV13) vaccine.** / Consult your health care provider.  Pneumococcal polysaccharide (PPSV23) vaccine.** / 1 to 2 doses if you smoke cigarettes or if you have certain conditions.  Meningococcal vaccine.** / Consult your health care provider.  Hepatitis A vaccine.** / Consult your health care provider.  Hepatitis B vaccine.** / Consult your health care provider. Screening for abdominal aortic aneurysm (AAA)  by ultrasound is recommended for people over 50 who have history of high blood pressure or who are current or former smokers. ++++++++++++++++++ Recommend Adult Low Dose Aspirin or  coated  Aspirin 81 mg daily  To reduce risk of Colon Cancer 20 %,  Skin Cancer 26 % ,  Melanoma 46%  and  Pancreatic cancer 60% +++++++++++++++++++ Vitamin D goal  is between 70-100.  Please make sure that you are taking your Vitamin D as directed.  It is very important as a natural anti-inflammatory  helping hair, skin, and nails, as well as reducing stroke and heart attack risk.  It helps your bones and helps with mood. It also decreases numerous cancer risks so please take it as directed.  Low Vit D is associated with a 200-300% higher risk for CANCER  and  200-300% higher risk for HEART   ATTACK  &  STROKE.   .....................................Marland Kitchen It is also associated with higher death rate at younger ages,  autoimmune diseases like Rheumatoid arthritis, Lupus, Multiple Sclerosis.    Also many other serious conditions, like depression,  Alzheimer's Dementia, infertility, muscle aches, fatigue, fibromyalgia - just to name a few. ++++++++++++++++++ Recommend the book "The END of DIETING" by Dr Excell Seltzer  & the book "The END of DIABETES " by Dr Excell Seltzer At [email or web address] - get book & Audio CD's    Being diabetic has a  300% increased risk for heart attack, stroke, cancer, and alzheimer- type vascular dementia. It is very important that you work harder with diet by avoiding all foods that are white. Avoid white rice (brown & wild rice is OK), white potatoes (sweetpotatoes in moderation is OK), White bread or wheat bread or anything made out of white flour like bagels, donuts, rolls, buns, biscuits, cakes, pastries, cookies, pizza crust, and pasta (made from white flour & egg whites) - vegetarian pasta or spinach or wheat pasta is OK. Multigrain breads like Arnold's or Pepper[employer], or multigrain sandwich thins or flatbreads.  Diet, exercise and weight loss can reverse and cure diabetes in the early stages.  Diet, exercise and weight loss is very important in the control and prevention of complications of diabetes which affects every system in your body, ie. Brain - dementia/stroke, eyes - glaucoma/blindness, heart - heart attack/heart failure, kidneys - dialysis, stomach - gastric paralysis, intestines - malabsorption, nerves - severe painful neuritis, circulation - gangrene & loss of a leg(s), and finally cancer and Alzheimers.    I recommend avoid fried & greasy foods,  sweets/candy, white rice (brown or wild rice or Quinoa is OK), white potatoes (sweet potatoes are OK) - anything made from white flour - bagels, doughnuts, rolls, buns, biscuits,white  and wheat breads, pizza crust and traditional pasta made of white flour & egg white(vegetarian pasta or spinach or wheat pasta is OK).  Multi-grain bread is OK - like multi-grain flat bread or sandwich thins. Avoid alcohol in excess. Exercise is also important.    Eat all the vegetables you want - avoid meat, especially red meat and dairy - especially cheese.  Cheese is the most concentrated form of trans-fats which is the worst thing to clog up our arteries. Veggie cheese is OK which can be found in the fresh produce section at [employer] or [employer] or [employer]  ++++++++++++++++++++++ DASH Eating Plan  DASH stands for "Dietary Approaches to Stop Hypertension."   The DASH eating plan is a healthy eating plan that has been shown to reduce high blood pressure (hypertension). Additional health benefits may include reducing the risk of type 2 diabetes mellitus, heart disease, and stroke. The DASH eating plan may also help with weight loss. WHAT DO I NEED TO KNOW ABOUT THE DASH EATING PLAN? For the DASH eating plan, you will follow these general guidelines:  Choose foods with a percent daily value for sodium of less than 5% (as listed on the food label).  Use salt-free seasonings or herbs instead of table salt or sea salt.  Check with your health care provider or pharmacist before using salt substitutes.  Eat lower-sodium products, often labeled as "lower sodium" or "no salt added."  Eat fresh foods.  Eat more vegetables, fruits, and low-fat dairy products.  Choose whole grains. Look for the word "whole" as the first word in the ingredient list.  Choose fish   Limit sweets, desserts, sugars, and sugary drinks.  Choose heart-healthy fats.  Eat veggie cheese   Eat more home-cooked food and less restaurant, buffet, and fast food.  Limit fried foods.  Cook foods using methods other than frying.  Limit canned  vegetables. If you do use them, rinse them well to decrease the  sodium.  When eating at a restaurant, ask that your food be prepared with less salt, or no salt if possible.                      WHAT FOODS CAN I EAT? Read Dr Fara Olden Fuhrman's books on The End of Dieting & The End of Diabetes  Grains Whole grain or whole wheat bread. Brown rice. Whole grain or whole wheat pasta. Quinoa, bulgur, and whole grain cereals. Low-sodium cereals. Corn or whole wheat flour tortillas. Whole grain cornbread. Whole grain crackers. Low-sodium crackers.  Vegetables Fresh or frozen vegetables (raw, steamed, roasted, or grilled). Low-sodium or reduced-sodium tomato and vegetable juices. Low-sodium or reduced-sodium tomato sauce and paste. Low-sodium or reduced-sodium canned vegetables.   Fruits All fresh, canned (in natural juice), or frozen fruits.  Protein Products  All fish and seafood.  Dried beans, peas, or lentils. Unsalted nuts and seeds. Unsalted canned beans.  Dairy Low-fat dairy products, such as skim or 1% milk, 2% or reduced-fat cheeses, low-fat ricotta or cottage cheese, or plain low-fat yogurt. Low-sodium or reduced-sodium cheeses.  Fats and Oils Tub margarines without trans fats. Light or reduced-fat mayonnaise and salad dressings (reduced sodium). Avocado. Safflower, olive, or canola oils. Natural peanut or almond butter.  Other Unsalted popcorn and pretzels. The items listed above may not be a complete list of recommended foods or beverages. Contact your dietitian for more options.  ++++++++++++++++++  WHAT FOODS ARE NOT RECOMMENDED? Grains/ White flour or wheat flour White bread. White pasta. White rice. Refined cornbread. Bagels and croissants. Crackers that contain trans fat.  Vegetables  Creamed or fried vegetables. Vegetables in a . Regular canned vegetables. Regular canned tomato sauce and paste. Regular tomato and vegetable juices.  Fruits Dried fruits. Canned fruit in light or heavy syrup. Fruit juice.  Meat and Other Protein  Products Meat in general - RED meat & White meat.  Fatty cuts of meat. Ribs, chicken wings, all processed meats as bacon, sausage, bologna, salami, fatback, hot dogs, bratwurst and packaged luncheon meats.  Dairy Whole or 2% milk, cream, half-and-half, and cream cheese. Whole-fat or sweetened yogurt. Full-fat cheeses or blue cheese. Non-dairy creamers and whipped toppings. Processed cheese, cheese spreads, or cheese curds.  Condiments Onion and garlic salt, seasoned salt, table salt, and sea salt. Canned and packaged gravies. Worcestershire sauce. Tartar sauce. Barbecue sauce. Teriyaki sauce. Soy sauce, including reduced sodium. Steak sauce. Fish sauce. Oyster sauce. Cocktail sauce. Horseradish. Ketchup and mustard. Meat flavorings and tenderizers. Bouillon cubes. Hot sauce. Tabasco sauce. Marinades. Taco seasonings. Relishes.  Fats and Oils Butter, stick margarine, lard, shortening and bacon fat. Coconut, palm kernel, or palm oils. Regular salad dressings.  Pickles and olives. Salted popcorn and pretzels.  The items listed above may not be a complete list of foods and beverages to avoid.

## 2017-12-03 LAB — CBC WITH DIFFERENTIAL/PLATELET
BASOS ABS: 7 {cells}/uL (ref 0–200)
Basophils Relative: 0.1 %
EOS ABS: 234 {cells}/uL (ref 15–500)
EOS PCT: 3.2 %
HCT: 44.5 % (ref 35.0–45.0)
Hemoglobin: 15.3 g/dL (ref 11.7–15.5)
Lymphs Abs: 1913 cells/uL (ref 850–3900)
MCH: 32.2 pg (ref 27.0–33.0)
MCHC: 34.4 g/dL (ref 32.0–36.0)
MCV: 93.7 fL (ref 80.0–100.0)
MONOS PCT: 8.7 %
MPV: 10.4 fL (ref 7.5–12.5)
NEUTROS ABS: 4511 {cells}/uL (ref 1500–7800)
NEUTROS PCT: 61.8 %
Platelets: 214 10*3/uL (ref 140–400)
RBC: 4.75 10*6/uL (ref 3.80–5.10)
RDW: 11.9 % (ref 11.0–15.0)
TOTAL LYMPHOCYTE: 26.2 %
WBC mixed population: 635 cells/uL (ref 200–950)
WBC: 7.3 10*3/uL (ref 3.8–10.8)

## 2017-12-03 LAB — TSH: TSH: 2.71 m[IU]/L (ref 0.40–4.50)

## 2017-12-03 LAB — URINALYSIS, ROUTINE W REFLEX MICROSCOPIC
BILIRUBIN URINE: NEGATIVE
GLUCOSE, UA: NEGATIVE
HGB URINE DIPSTICK: NEGATIVE
LEUKOCYTES UA: NEGATIVE
Nitrite: NEGATIVE
Protein, ur: NEGATIVE
Specific Gravity, Urine: 1.022 (ref 1.001–1.03)

## 2017-12-03 LAB — VITAMIN D 25 HYDROXY (VIT D DEFICIENCY, FRACTURES): VIT D 25 HYDROXY: 66 ng/mL (ref 30–100)

## 2017-12-03 LAB — BASIC METABOLIC PANEL WITH GFR
BUN: 13 mg/dL (ref 7–25)
CHLORIDE: 103 mmol/L (ref 98–110)
CO2: 28 mmol/L (ref 20–32)
Calcium: 9.7 mg/dL (ref 8.6–10.4)
Creat: 0.65 mg/dL (ref 0.50–1.05)
GFR, EST AFRICAN AMERICAN: 114 mL/min/{1.73_m2} (ref >=60)
GFR, Est Non African American: 99 mL/min/{1.73_m2} (ref >=60)
Glucose, Bld: 87 mg/dL (ref 65–99)
POTASSIUM: 5.4 mmol/L — AB (ref 3.5–5.3)
Sodium: 139 mmol/L (ref 135–146)

## 2017-12-03 LAB — LIPID PANEL
Cholesterol: 190 mg/dL (ref <200)
HDL: 75 mg/dL (ref >50)
LDL Cholesterol (Calc): 97 mg/dL (calc)
Non-HDL Cholesterol (Calc): 115 mg/dL (calc) (ref <130)
Total CHOL/HDL Ratio: 2.5 (calc) (ref <5.0)
Triglycerides: 90 mg/dL (ref <150)

## 2017-12-03 LAB — MICROALBUMIN / CREATININE URINE RATIO
Creatinine, Urine: 94 mg/dL (ref 20–275)
MICROALB/CREAT RATIO: 5 ug/mg{creat} (ref <30)
Microalb, Ur: 0.5 mg/dL

## 2017-12-03 LAB — HEMOGLOBIN A1C
HEMOGLOBIN A1C: 5 %{Hb} (ref <5.7)
MEAN PLASMA GLUCOSE: 97 (calc)
eAG (mmol/L): 5.4 (calc)

## 2017-12-03 LAB — HEPATIC FUNCTION PANEL
AG Ratio: 1.7 (calc) (ref 1.0–2.5)
ALKALINE PHOSPHATASE (APISO): 72 U/L (ref 33–130)
ALT: 34 U/L — ABNORMAL HIGH (ref 6–29)
AST: 27 U/L (ref 10–35)
Albumin: 4.3 g/dL (ref 3.6–5.1)
BILIRUBIN DIRECT: 0.1 mg/dL (ref 0.0–0.2)
BILIRUBIN INDIRECT: 0.5 mg/dL (ref 0.2–1.2)
Globulin: 2.6 g/dL (calc) (ref 1.9–3.7)
Total Bilirubin: 0.6 mg/dL (ref 0.2–1.2)
Total Protein: 6.9 g/dL (ref 6.1–8.1)

## 2017-12-03 LAB — VITAMIN B12: VITAMIN B 12: 626 pg/mL (ref 200–1100)

## 2017-12-03 LAB — INSULIN, RANDOM: INSULIN: 3.5 u[IU]/mL (ref 2.0–19.6)

## 2017-12-03 LAB — MAGNESIUM: MAGNESIUM: 2.5 mg/dL (ref 1.5–2.5)

## 2017-12-03 LAB — IRON, TOTAL/TOTAL IRON BINDING CAP
%SAT: 29 % (ref 11–50)
IRON: 105 ug/dL (ref 45–160)
TIBC: 356 mcg/dL (calc) (ref 250–450)

## 2017-12-06 ENCOUNTER — Encounter: Payer: Self-pay | Admitting: Internal Medicine

## 2017-12-10 ENCOUNTER — Other Ambulatory Visit: Payer: Self-pay | Admitting: Internal Medicine

## 2017-12-10 ENCOUNTER — Encounter: Payer: Self-pay | Admitting: Gastroenterology

## 2017-12-10 DIAGNOSIS — F900 Attention-deficit hyperactivity disorder, predominantly inattentive type: Secondary | ICD-10-CM

## 2017-12-10 MED ORDER — AMPHETAMINE-DEXTROAMPHET ER 20 MG PO CP24: 20.0000 mg | ORAL_CAPSULE | ORAL | 0 refills | Status: DC

## 2018-01-04 ENCOUNTER — Encounter: Payer: Self-pay | Admitting: Internal Medicine

## 2018-01-14 ENCOUNTER — Ambulatory Visit (AMBULATORY_SURGERY_CENTER): Payer: Self-pay | Admitting: *Deleted

## 2018-01-14 ENCOUNTER — Telehealth: Payer: Self-pay | Admitting: *Deleted

## 2018-01-14 ENCOUNTER — Other Ambulatory Visit: Payer: Self-pay

## 2018-01-14 ENCOUNTER — Encounter: Payer: Self-pay | Admitting: Gastroenterology

## 2018-01-14 VITALS — Ht 62.0 in | Wt 157.0 lb

## 2018-01-14 DIAGNOSIS — Z86010 Personal history of colonic polyps: Secondary | ICD-10-CM

## 2018-01-14 MED ORDER — PEG-KCL-NACL-NASULF-NA ASC-C 140 G PO SOLR: 1.0000 | Freq: Once | ORAL | 0 refills | Status: AC

## 2018-01-14 MED ORDER — PEG-KCL-NACL-NASULF-NA ASC-C 140 G PO SOLR: 1.0000 | Freq: Once | ORAL | 0 refills | Status: DC

## 2018-01-14 NOTE — Telephone Encounter (Signed)
Patient notified of Dr.Danis' recommendations. She was very appreciative for this information.

## 2018-01-14 NOTE — Telephone Encounter (Signed)
Thanks for letting me know.  She will only be NPO after MN and case is at 8:30 AM, so I think she will be OK.  She should have gatorade or a popsicle about 10PM to keep blood glucose from getting too low.  - HD

## 2018-01-14 NOTE — Progress Notes (Signed)
Patient denies any allergies to eggs or soy. Patient denies any problems with anesthesia/sedation. Patient denies any oxygen use at home. Patient denies taking any diet/weight loss medications or blood thinners. Phone note sent to [facility] CRNA and Dr.Danis.

## 2018-01-14 NOTE — Telephone Encounter (Signed)
John and Dr.Danis,  Patient came in today for pv for recall colon at [facility] on 01/28/18. Last colon was 2012 with Dr.Kaplan hx colon polyps. Patient states she has "seizures" due to hypoglycemia and she does not take any medication for seizures since about 3 years ago. Her last "seizure" was on 01/08/2018 due to not having ate any food that day and putting sugar in her coffee that morning, something she don't usually do per pt. She blacked out in her car in a parking lot but then awaken on her on. Before this episode her last seizure was about 3 years ago per pt. [place] for recall colon at [facility] on 01/28/18? Please advise. Thank you,Robbin pv

## 2018-01-17 ENCOUNTER — Other Ambulatory Visit: Payer: Self-pay | Admitting: Internal Medicine

## 2018-01-17 DIAGNOSIS — Z0001 Encounter for general adult medical examination with abnormal findings: Secondary | ICD-10-CM

## 2018-01-17 DIAGNOSIS — E559 Vitamin D deficiency, unspecified: Secondary | ICD-10-CM

## 2018-01-17 NOTE — Telephone Encounter (Signed)
Noted  

## 2018-01-17 NOTE — Telephone Encounter (Signed)
Robbin,  This pt is cleared for anesthetic care at [facility].  Thanks,  Osvaldo Angst

## 2018-01-28 ENCOUNTER — Other Ambulatory Visit: Payer: Self-pay

## 2018-01-28 ENCOUNTER — Ambulatory Visit (AMBULATORY_SURGERY_CENTER): Payer: BLUE CROSS/BLUE SHIELD | Admitting: Gastroenterology

## 2018-01-28 ENCOUNTER — Encounter: Payer: Self-pay | Admitting: Gastroenterology

## 2018-01-28 VITALS — BP 99/58 | HR 60 | Temp 97.8°F | Resp 12 | Ht 62.0 in | Wt 157.0 lb

## 2018-01-28 DIAGNOSIS — Z86010 Personal history of colonic polyps: Secondary | ICD-10-CM | POA: Diagnosis present

## 2018-01-28 DIAGNOSIS — Z1211 Encounter for screening for malignant neoplasm of colon: Secondary | ICD-10-CM | POA: Diagnosis not present

## 2018-01-28 MED ORDER — SODIUM CHLORIDE 0.9 % IV SOLN: 500.0000 mL | Freq: Once | INTRAVENOUS | Status: DC

## 2018-01-28 NOTE — Progress Notes (Signed)
Pt. Reports no change in her medical or surgical history since her pre-visit 01/14/2018.

## 2018-01-28 NOTE — Patient Instructions (Addendum)
YOU HAD AN ENDOSCOPIC PROCEDURE TODAY AT [facility]:   Refer to the procedure report that was given to you for any specific questions about what was found during the examination.  If the procedure report does not answer your questions, please call your gastroenterologist to clarify.  If you requested that your care partner not be given the details of your procedure findings, then the procedure report has been included in a sealed envelope for you to review at your convenience later.  YOU SHOULD EXPECT: Some feelings of bloating in the abdomen. Passage of more gas than usual.  Walking can help get rid of the air that was put into your GI tract during the procedure and reduce the bloating. If you had a lower endoscopy (such as a colonoscopy or flexible sigmoidoscopy) you may notice spotting of blood in your stool or on the toilet paper. If you underwent a bowel prep for your procedure, you may not have a normal bowel movement for a few days.  Please Note:  You might notice some irritation and congestion in your nose or some drainage.  This is from the oxygen used during your procedure.  There is no need for concern and it should clear up in a day or so.  SYMPTOMS TO REPORT IMMEDIATELY:   Following lower endoscopy (colonoscopy or flexible sigmoidoscopy):  Excessive amounts of blood in the stool  Significant tenderness or worsening of abdominal pains  Swelling of the abdomen that is new, acute  Fever of 100F or higher    For urgent or emergent issues, a gastroenterologist can be reached at any hour by calling [phone].   DIET:  We do recommend a small meal at first, but then you may proceed to your regular diet.  Drink plenty of fluids but you should avoid alcoholic beverages for 24 hours.  ACTIVITY:  You should plan to take it easy for the rest of today and you should NOT DRIVE or use heavy machinery until tomorrow (because of the sedation medicines used during the test).     FOLLOW UP: Our staff will call the number listed on your records the next business day following your procedure to check on you and address any questions or concerns that you may have regarding the information given to you following your procedure. If we do not reach you, we will leave a message.  However, if you are feeling well and you are not experiencing any problems, there is no need to return our call.  We will assume that you have returned to your regular daily activities without incident.  If any biopsies were taken you will be contacted by phone or by letter within the next 1-3 weeks.  Please call us at [phone] if you have not heard about the biopsies in 3 weeks.    SIGNATURES/CONFIDENTIALITY: You and/or your care partner have signed paperwork which will be entered into your electronic medical record.  These signatures attest to the fact that that the information above on your After Visit Summary has been reviewed and is understood.  Full responsibility of the confidentiality of this discharge information lies with you and/or your care-partner.   Resume medications. Information given on hemorrhoid banding.

## 2018-01-28 NOTE — Progress Notes (Signed)
Husband stated she is hypoglycemic and we need to make sure her blood sugar is not to low or she would have a seizure. cbg checked =100.

## 2018-01-28 NOTE — Progress Notes (Signed)
A and O x3. Report to RN. Tolerated MAC anesthesia well.

## 2018-01-28 NOTE — Op Note (Signed)
Reading Patient Name: Emily Graham Procedure Date: 01/28/2018 8:00 AM MRN: [id number] Endoscopist: Mallie Mussel L. Loletha Carrow , MD Age: 57 Referring MD:  Date of Birth: 1961-07-09 Gender: Female Account #: [id number] Procedure:                Colonoscopy Indications:              Surveillance: Personal history of adenomatous                            polyps on last colonoscopy > 5 years ago (TA<68m                            Jan 2012) Medicines:                Monitored Anesthesia Care Procedure:                Pre-Anesthesia Assessment:                           - Prior to the procedure, a History and Physical                            was performed, and patient medications and                            allergies were reviewed. The patient's tolerance of                            previous anesthesia was also reviewed. The risks                            and benefits of the procedure and the sedation                            options and risks were discussed with the patient.                            All questions were answered, and informed consent                            was obtained. Prior Anticoagulants: The patient has                            taken no previous anticoagulant or antiplatelet                            agents. ASA Grade Assessment: II - A patient with                            mild systemic disease. After reviewing the risks                            and benefits, the patient was deemed in  satisfactory condition to undergo the procedure.                           After obtaining informed consent, the colonoscope                            was passed under direct vision. Throughout the                            procedure, the patient's blood pressure, pulse, and                            oxygen saturations were monitored continuously. The                            Model CF-HQ190L [id number]) scope was introduced                           through the anus and advanced to the the cecum,                            identified by appendiceal orifice and ileocecal                            valve. The colonoscopy was performed without                            difficulty. The patient tolerated the procedure                            well. The quality of the bowel preparation was                            excellent after lavage. The ileocecal valve,                            appendiceal orifice, and rectum were photographed. Scope In: 8:10:10 AM Scope Out: 8:22:55 AM Scope Withdrawal Time: 0 hours 9 minutes 48 seconds  Total Procedure Duration: 0 hours 12 minutes 45 seconds  Findings:                 The perianal and digital rectal examinations were                            normal.                           The entire examined colon appeared normal on direct                            and retroflexion views. Complications:            No immediate complications. Estimated Blood Loss:     Estimated blood loss: none. Impression:               - The entire examined colon is normal on  direct and                            retroflexion views.                           - No specimens collected. Recommendation:           - Patient has a contact number available for                            emergencies. The signs and symptoms of potential                            delayed complications were discussed with the                            patient. Return to normal activities tomorrow.                            Written discharge instructions were provided to the                            patient.                           - Resume previous diet.                           - Continue present medications.                           - Repeat colonoscopy in 10 years for screening                            purposes. Henry L. Loletha Carrow, MD 01/28/2018 8:31:56 AM This report has been signed electronically.

## 2018-01-31 ENCOUNTER — Telehealth: Payer: Self-pay | Admitting: *Deleted

## 2018-01-31 NOTE — Telephone Encounter (Signed)
  Follow up Call-  Call back number 01/28/2018  Post procedure Call Back phone  # [phone]  Permission to leave phone message Yes  Some recent data might be hidden     Patient questions:  Do you have a fever, pain , or abdominal swelling? No. Pain Score  0 *  Have you tolerated food without any problems? Yes.    Have you been able to return to your normal activities? Yes.    Do you have any questions about your discharge instructions: Diet   No. Medications  No. Follow up visit  No.  Do you have questions or concerns about your Care? No.  Actions: * If pain score is 4 or above: No action needed, pain <4.

## 2018-02-02 ENCOUNTER — Other Ambulatory Visit: Payer: Self-pay | Admitting: Internal Medicine

## 2018-02-02 DIAGNOSIS — F900 Attention-deficit hyperactivity disorder, predominantly inattentive type: Secondary | ICD-10-CM

## 2018-02-02 MED ORDER — AMPHETAMINE-DEXTROAMPHET ER 20 MG PO CP24: 20.0000 mg | ORAL_CAPSULE | ORAL | 0 refills | Status: DC

## 2018-03-09 ENCOUNTER — Other Ambulatory Visit: Payer: Self-pay | Admitting: Internal Medicine

## 2018-03-09 DIAGNOSIS — F900 Attention-deficit hyperactivity disorder, predominantly inattentive type: Secondary | ICD-10-CM

## 2018-03-09 MED ORDER — AMPHETAMINE-DEXTROAMPHET ER 20 MG PO CP24: 20.0000 mg | ORAL_CAPSULE | ORAL | 0 refills | Status: DC

## 2018-03-16 DIAGNOSIS — E663 Overweight: Secondary | ICD-10-CM | POA: Insufficient documentation

## 2018-03-16 NOTE — Progress Notes (Signed)
FOLLOW UP  Assessment and Plan:   BP  Well controlled without medications at this time Monitor blood pressure at home; patient to call if consistently greater than 130/80 Continue DASH diet.   Reminder to go to the ER if any CP, SOB, nausea, dizziness, severe HA, changes vision/speech, left arm numbness and tingling and jaw pain.  Cholesterol Currently at goal; not on medications at this time Continue low cholesterol diet and exercise.  Review of historic values demonstrates has been well controlled for some time; defer lipid panel today   Other abnormal glucose Recent A1Cs at goal/very well controlled Discussed diet/exercise, weight management   Overweight Long discussion about weight loss, diet, and exercise Recommended diet heavy in fruits and veggies and low in animal meats, cheeses, and dairy products, appropriate calorie intake Discussed ideal weight for height  Patient will work on eating out less, cutting back on white flour, eating low glycemic index Will follow up in 3 months  Vitamin D Def At goal at last visit; continue supplementation to maintain goal of 70-100 Defer Vit D level  ADD Continue medications Helps with focus, no AE's. The patient was counseled on the addictive nature of the medication and was encouraged to take drug holidays when not needed.   Defer labs today as just had 3 months ago and all at goal; obtain at next visit  Continue diet and meds as discussed. Further disposition pending results of labs. Discussed med's effects and SE's.   Over 30 minutes of exam, counseling, chart review, and critical decision making was performed.   Future Appointments  Date Time Provider [place]  06/23/2018  3:30 PM Unk Pinto, MD [facility] None  12/08/2018  3:45 PM Unk Pinto, MD [facility] None    ----------------------------------------------------------------------------------------------------------------------  HPI 57 y.o.  female  presents for 3 month follow up on blood pressure, cholesterol, glucose management, weight, ADD and vitamin D deficiency. She also has hx of GERD controlled on ranitidine.   Patient is on an ADD medication, she states that the medication is helping and she denies any adverse reactions. She currently reports is taking this Mon-Fri on days that she works.   she has a diagnosis of GERD which is currently managed by ranitidine 300 mg once daily she reports symptoms is currently well controlled, and denies breakthrough reflux, burning in chest, hoarseness or cough.    BMI is Body mass index is 29.08 kg/m., she has been working on diet and exercise. Wt Readings from Last 3 Encounters:  03/17/18 159 lb (72.1 kg)  01/28/18 157 lb (71.2 kg)  01/14/18 157 lb (71.2 kg)   Today their BP is    She does not workout, but walks a lot at work and gets at least 7000 steps in daily. She denies chest pain, shortness of breath, dizziness.   She is not on cholesterol medication. Her cholesterol is at goal. The cholesterol last visit was:   Lab Results  Component Value Date   CHOL 190 12/02/2017   HDL 75 12/02/2017   LDLCALC 97 12/02/2017   TRIG 90 12/02/2017   CHOLHDL 2.5 12/02/2017    She has been working on diet and exercise for glucose management, and denies foot ulcerations, hyperglycemia, increased appetite, nausea, paresthesia of the feet, polydipsia, polyuria, visual disturbances, vomiting and weight loss. Last A1C in the office was:  Lab Results  Component Value Date   HGBA1C 5.0 12/02/2017   Patient is on Vitamin D supplement and at goal at recent check:  Lab Results  Component Value Date   VD25OH 66 12/02/2017        Current Medications:  Current Outpatient Medications on File Prior to Visit  Medication Sig  . amphetamine-dextroamphetamine (ADDERALL XR) 20 MG 24 hr capsule Take 1 capsule (20 mg total) by mouth every morning.  . Arginine 500 MG CAPS Take 500 mg by mouth daily.   Marland Kitchen aspirin 81 MG tablet Take 81 mg by mouth daily.  Marland Kitchen b complex vitamins tablet Take 1 tablet by mouth daily.  . IRON, FERROUS SULFATE, PO Take 65 mg by mouth daily.  . milk thistle 175 MG tablet Take 175 mg by mouth daily.  . Multiple Vitamin (MULTIVITAMIN) tablet Take 1 tablet by mouth daily.   . NON FORMULARY Deep Blue Doterra Oil  . NON FORMULARY DDR, cellular complex  . NON FORMULARY Zendocrine  . NON FORMULARY Mitro 2 Max  . NON FORMULARY XEO Mega, omega complex  . NON FORMULARY Tri Ease Seasonal Blend  . NON FORMULARY Bone Nutrient  . NON FORMULARY Keratin  . NON FORMULARY Collagen Forensic psychologist  . polycarbophil (FIBERCON) 625 MG tablet Take 625 mg by mouth daily.  . TURMERIC PO Take 1,500 mg by mouth 2 (two) times daily.  . Vitamin D, Ergocalciferol, (DRISDOL) 50000 units CAPS capsule TAKE 1 CAPSULE BY MOUTH DAILY   No current facility-administered medications on file prior to visit.      Allergies:  Allergies  Allergen Reactions  . Latex Rash     Medical History:  Past Medical History:  Diagnosis Date  . ADD (attention deficit disorder) 07/19/2013  . Arthritis   . Cholecystitis, acute with cholelithiasis 07/19/2013  . Chronic left shoulder pain 07/19/2013  . Dyspnea   . GERD (gastroesophageal reflux disease) 07/19/2013  . Goiter   . HTN (hypertension)   . Hypoglycemia   . Hypoglycemia 07/19/2013  . Murmur   . Pre-diabetes   . Seizure disorder, primary generalized ([place]) 07/19/2013   May be related to hypoglycemia  . Seizures ([place]) last 01/08/18   2013-grand mal due to hypoglycemia  . Sleep apnea    "mild"-not using CPAP per pt   Family history- Reviewed and unchanged Social history- Reviewed and unchanged   Review of Systems:  Review of Systems  Constitutional: Negative for malaise/fatigue and weight loss.  HENT: Negative for hearing loss and tinnitus.   Eyes: Negative for blurred vision and double vision.  Respiratory: Negative for cough, shortness of breath  and wheezing.   Cardiovascular: Negative for chest pain, palpitations, orthopnea, claudication and leg swelling.  Gastrointestinal: Negative for abdominal pain, blood in stool, constipation, diarrhea, heartburn, melena, nausea and vomiting.  Genitourinary: Negative.   Musculoskeletal: Negative for joint pain and myalgias.  Skin: Negative for rash.  Neurological: Negative for dizziness, tingling, sensory change, weakness and headaches.  Endo/Heme/Allergies: Positive for environmental allergies. Negative for polydipsia.  Psychiatric/Behavioral: Negative.  Negative for depression and substance abuse. The patient is not nervous/anxious and does not have insomnia.   All other systems reviewed and are negative.    Physical Exam: Ht 5' 2"  (1.575 m)   Wt 159 lb (72.1 kg)   BMI 29.08 kg/m  Wt Readings from Last 3 Encounters:  03/17/18 159 lb (72.1 kg)  01/28/18 157 lb (71.2 kg)  01/14/18 157 lb (71.2 kg)   General Appearance: Well nourished, in no apparent distress. Eyes: PERRLA, EOMs, conjunctiva no swelling or erythema Sinuses: No Frontal/maxillary tenderness ENT/Mouth: Ext aud canals clear, TMs without erythema,  bulging. No erythema, swelling, or exudate on post pharynx.  Tonsils not swollen or erythematous. Hearing normal.  Neck: Supple, thyroid normal.  Respiratory: Respiratory effort normal, BS equal bilaterally without rales, rhonchi, wheezing or stridor.  Cardio: RRR with no MRGs. Brisk peripheral pulses without edema.  Abdomen: Soft, + BS.  Non tender, no guarding, rebound, hernias, masses. Lymphatics: Non tender without lymphadenopathy.  Musculoskeletal: Full ROM, 5/5 strength, Normal gait Skin: Warm, dry without rashes, lesions, ecchymosis.  Neuro: Cranial nerves intact. No cerebellar symptoms.  Psych: Awake and oriented X 3, normal affect, Insight and Judgment appropriate.    Izora Ribas, NP 3:53 PM [facility]

## 2018-03-17 ENCOUNTER — Encounter: Payer: Self-pay | Admitting: Adult Health

## 2018-03-17 ENCOUNTER — Ambulatory Visit (INDEPENDENT_AMBULATORY_CARE_PROVIDER_SITE_OTHER): Payer: BLUE CROSS/BLUE SHIELD | Admitting: Adult Health

## 2018-03-17 VITALS — BP 136/82 | HR 84 | Temp 97.3°F | Ht 62.0 in | Wt 159.0 lb

## 2018-03-17 DIAGNOSIS — R7309 Other abnormal glucose: Secondary | ICD-10-CM | POA: Diagnosis not present

## 2018-03-17 DIAGNOSIS — I10 Essential (primary) hypertension: Secondary | ICD-10-CM | POA: Diagnosis not present

## 2018-03-17 DIAGNOSIS — K219 Gastro-esophageal reflux disease without esophagitis: Secondary | ICD-10-CM | POA: Diagnosis not present

## 2018-03-17 DIAGNOSIS — E782 Mixed hyperlipidemia: Secondary | ICD-10-CM | POA: Diagnosis not present

## 2018-03-17 DIAGNOSIS — Z79899 Other long term (current) drug therapy: Secondary | ICD-10-CM

## 2018-03-17 DIAGNOSIS — E559 Vitamin D deficiency, unspecified: Secondary | ICD-10-CM

## 2018-03-17 DIAGNOSIS — E663 Overweight: Secondary | ICD-10-CM

## 2018-03-17 DIAGNOSIS — F988 Other specified behavioral and emotional disorders with onset usually occurring in childhood and adolescence: Secondary | ICD-10-CM

## 2018-03-17 MED ORDER — RANITIDINE HCL 300 MG PO TABS: ORAL_TABLET | ORAL | 1 refills | Status: DC

## 2018-04-11 ENCOUNTER — Encounter: Payer: Self-pay | Admitting: Internal Medicine

## 2018-04-11 ENCOUNTER — Ambulatory Visit (INDEPENDENT_AMBULATORY_CARE_PROVIDER_SITE_OTHER): Payer: BLUE CROSS/BLUE SHIELD | Admitting: Internal Medicine

## 2018-04-11 VITALS — BP 116/84 | HR 88 | Temp 100.2°F | Resp 16 | Ht 62.0 in | Wt 154.0 lb

## 2018-04-11 DIAGNOSIS — J4530 Mild persistent asthma, uncomplicated: Secondary | ICD-10-CM | POA: Diagnosis not present

## 2018-04-11 DIAGNOSIS — J40 Bronchitis, not specified as acute or chronic: Secondary | ICD-10-CM | POA: Diagnosis not present

## 2018-04-11 MED ORDER — PREDNISONE 20 MG PO TABS: ORAL_TABLET | ORAL | 0 refills | Status: DC

## 2018-04-11 MED ORDER — BENZONATATE 200 MG PO CAPS: ORAL_CAPSULE | ORAL | 1 refills | Status: DC

## 2018-04-11 MED ORDER — PROMETHAZINE-DM 6.25-15 MG/5ML PO SYRP
ORAL_SOLUTION | ORAL | 1 refills | Status: DC
Start: 2018-04-11 — End: 2018-05-16

## 2018-04-11 NOTE — Progress Notes (Signed)
Subjective:    Patient ID: Emily Graham, female    DOB: 1961-04-10, 57 y.o.   MRN: [id number]  HPI  This very nice 57 yo MWF presents with 4-5 day prodrome of worsening worsening cough, fever/chills & sweats. Minimal sputum. Some dyspnea.   Medication Sig  . Arginine 500 MG CAPS Take 500 mg by mouth daily.  Marland Kitchen aspirin 81 MG tablet Take 81 mg by mouth daily.  Marland Kitchen b complex vitamins tablet Take 1 tablet by mouth daily.  . IRON, FERROUS SULFATE, PO Take 65 mg by mouth daily.  . milk thistle 175 MG tablet Take 175 mg by mouth daily.  . Multiple Vitamin (MULTIVITAMIN) tablet Take 1 tablet by mouth daily.   . NON FORMULARY Deep Blue Doterra Oil  . NON FORMULARY DDR, cellular complex  . NON FORMULARY Zendocrine  . NON FORMULARY Mitro 2 Max  . NON FORMULARY XEO Mega, omega complex  . NON FORMULARY Tri Ease Seasonal Blend  . NON FORMULARY Bone Nutrient  . NON FORMULARY Keratin  . NON FORMULARY Collagen Forensic psychologist  . polycarbophil (FIBERCON) 625 MG tablet Take 625 mg by mouth daily.  . ranitidine (ZANTAC) 300 MG tablet Take 1/2- 1 tab daily for heartburn & indigestion  . TURMERIC PO Take 1,500 mg by mouth 2 (two) times daily.  . Vitamin D, Ergocalciferol, (DRISDOL) 50000 units CAPS capsule TAKE 1 CAPSULE BY MOUTH DAILY  . amphetamine-dextroamphetamine (ADDERALL XR) 20 MG 24 hr capsule Take 1 capsule (20 mg total) by mouth every morning.   Allergies  Allergen Reactions  . Latex Rash   Past Medical History:  Diagnosis Date  . ADD (attention deficit disorder) 07/19/2013  . Arthritis   . Cholecystitis, acute with cholelithiasis 07/19/2013  . Chronic left shoulder pain 07/19/2013  . Dyspnea   . GERD (gastroesophageal reflux disease) 07/19/2013  . Goiter   . HTN (hypertension)   . Hypoglycemia   . Hypoglycemia 07/19/2013  . Murmur   . Pre-diabetes   . Seizure disorder, primary generalized ([place]) 07/19/2013   May be related to hypoglycemia  . Seizures ([place]) last 01/08/18   2013-grand mal due to  hypoglycemia  . Sleep apnea    "mild"-not using CPAP per pt   Past Surgical History:  Procedure Laterality Date  . ABDOMINAL HYSTERECTOMY    . BREAST ENHANCEMENT SURGERY    . CHOLECYSTECTOMY N/A 07/20/2013   Procedure: LAPAROSCOPIC CHOLECYSTECTOMY WITH INTRAOPERATIVE CHOLANGIOGRAM;  Surgeon: Shann Medal, MD;  Location: [facility];  Service: General;  Laterality: N/A;  . GASTRIC BYPASS    . GASTRIC BYPASS    . GASTRIC BYPASS  2000  . leg lift  2008   had aspiration in lungs, fluid was "drained" per pt/this was done at outpatient center/RM  . Stomach tuck    . TOTAL SHOULDER REPLACEMENT     Review of Systems  10 point systems review negative except as above.   Objective:   Physical Exam  BP 116/84   Pulse 88   Temp 100.2 F (37.9 C)   Resp 16   Ht 5' 2"  (1.575 m)   Wt 154 lb (69.9 kg)   BMI 28.17 kg/m   Brassy wheezy cough. No cyanosis or stridor. Sl hoarse w/laryngitis.  HEENT - WNL. Neck - supple.  Chest - Few inspiratory rales and expiratory wheezes Cor - Nl HS. RRR w/o sig MGR. PP 1(+). No edema. MS- FROM w/o deformities.  Gait Nl. Neuro -  Nl w/o focal abnormalities.  Assessment & Plan:   1. Mild persistent asthmatic bronchitis without complication  - Has a Z-pak from her employer that she will start today   - Dispensed Anora x 7 day with instructions  - predniSONE (DELTASONE) 20 MG tablet; 1 tab 3 x day for 3 days, then 1 tab 2 x day for 3 days, then 1 tab 1 x day for 5 days  Dispense: 20 tablet  - promethazine-dextromethorphan (PROMETHAZINE-DM) 6.25-15 MG/5ML syrup; Take 1 to 2 tsp enery 4 hours if needed for cough  Dispense: 360 mL; Refill: 1  2. Tracheobronchitis  - predniSONE (DELTASONE) 20 MG tablet; 1 tab 3 x day for 3 days, then 1 tab 2 x day for 3 days, then 1 tab 1 x day for 5 days  Dispense: 20 tablet  - benzonatate (TESSALON) 200 MG capsule; Take 1 perle 3 x / day to prevent cough  Dispense: 30 capsule; Refill: 1  -  promethazine-dextromethorphan (PROMETHAZINE-DM) 6.25-15 MG/5ML syrup; Take 1 to 2 tsp enery 4 hours if needed for cough  Dispense: 360 mL; Refill: 1  - Discussed meds & SE"s And recommended ROV or go to ER if Sx's worsen.

## 2018-05-16 ENCOUNTER — Other Ambulatory Visit: Payer: Self-pay | Admitting: Internal Medicine

## 2018-05-16 DIAGNOSIS — F900 Attention-deficit hyperactivity disorder, predominantly inattentive type: Secondary | ICD-10-CM

## 2018-05-16 MED ORDER — AMPHETAMINE-DEXTROAMPHET ER 20 MG PO CP24: 20.0000 mg | ORAL_CAPSULE | ORAL | 0 refills | Status: DC

## 2018-06-22 NOTE — Patient Instructions (Signed)

## 2018-06-22 NOTE — Progress Notes (Signed)
This very nice 57 y.o. MWF  presents for 6 month follow up with HTN, HLD, Pre-Diabetes and Vitamin D Deficiency. Patient has GERD controlled on Ranitidine. Patient also has Ch LBP from Lumbar DDD and receives EDSI's from Dr Mina Marble. Patient has hx/o inattentive ADD and has done well on Adderall with improved focus and tasking.      Patient is monitored expectantly for labile HTN circa 2000 & BP has been controlled at home. Today's BPis borderline elevated - 132/88. Patient has had no complaints of any cardiac type chest pain, palpitations, dyspnea / orthopnea / PND, dizziness, claudication, or dependent edema.     Hyperlipidemia is controlled with diet & meds. Patient denies myalgias or other med SE's. Last Lipids were at goal: Lab Results  Component Value Date   CHOL 190 12/02/2017   HDL 75 12/02/2017   LDLCALC 97 12/02/2017   TRIG 90 12/02/2017   CHOLHDL 2.5 12/02/2017      Also, the patient has history of  Reactive Hypoglycemia post Gastric Bipass in 2000 and is expectantly monitored for PreDiabetes and has had no symptoms of reactive hypoglycemia, diabetic polys, paresthesias or visual blurring.  Last A1c was normal & at goal: Lab Results  Component Value Date   HGBA1C 5.0 12/02/2017      Further, the patient also has history of Vitamin D Deficiency ("27"/2009) and supplements vitamin D without any suspected side-effects. Last vitamin D was at goal:  Lab Results  Component Value Date   VD25OH 66 12/02/2017   Current Outpatient Medications on File Prior to Visit  Medication Sig  . Arginine 500 MG CAPS Take 500 mg by mouth daily.  Marland Kitchen aspirin 81 MG tablet Take 81 mg by mouth daily.  Marland Kitchen b complex vitamins tablet Take 1 tablet by mouth daily.  . IRON, FERROUS SULFATE, PO Take 65 mg by mouth daily.  . milk thistle 175 MG tablet Take 175 mg by mouth daily.  . Multiple Vitamin (MULTIVITAMIN) tablet Take 1 tablet by mouth daily.   . NON FORMULARY Deep Blue Doterra Oil  . NON FORMULARY  DDR, cellular complex  . NON FORMULARY Zendocrine  . NON FORMULARY Mitro 2 Max  . NON FORMULARY XEO Mega, omega complex  . NON FORMULARY Tri Ease Seasonal Blend  . NON FORMULARY Bone Nutrient  . NON FORMULARY Keratin  . NON FORMULARY Collagen Forensic psychologist  . polycarbophil (FIBERCON) 625 MG tablet Take 625 mg by mouth daily.  . ranitidine (ZANTAC) 300 MG tablet Take 1/2- 1 tab daily for heartburn & indigestion  . TURMERIC PO Take 1,500 mg by mouth 2 (two) times daily.   No current facility-administered medications on file prior to visit.    Allergies  Allergen Reactions  . Latex Rash   PMHx:   Past Medical History:  Diagnosis Date  . ADD (attention deficit disorder) 07/19/2013  . Arthritis   . Cholecystitis, acute with cholelithiasis 07/19/2013  . Chronic left shoulder pain 07/19/2013  . Dyspnea   . GERD (gastroesophageal reflux disease) 07/19/2013  . Goiter   . HTN (hypertension)   . Hypoglycemia   . Hypoglycemia 07/19/2013  . Murmur   . Pre-diabetes   . Seizure disorder, primary generalized ([place]) 07/19/2013   May be related to hypoglycemia  . Seizures ([place]) last 01/08/18   2013-grand mal due to hypoglycemia  . Sleep apnea    "mild"-not using CPAP per pt   Immunization History  Administered Date(s) Administered  . Influenza-Unspecified  09/15/2016  . PPD Test 11/30/2016  . Pneumococcal Polysaccharide-23 12/14/1998  . Tdap 11/06/2009   Past Surgical History:  Procedure Laterality Date  . ABDOMINAL HYSTERECTOMY    . BREAST ENHANCEMENT SURGERY    . CHOLECYSTECTOMY N/A 07/20/2013   Procedure: LAPAROSCOPIC CHOLECYSTECTOMY WITH INTRAOPERATIVE CHOLANGIOGRAM;  Surgeon: Shann Medal, MD;  Location: [facility];  Service: General;  Laterality: N/A;  . GASTRIC BYPASS    . GASTRIC BYPASS    . GASTRIC BYPASS  2000  . leg lift  2008   had aspiration in lungs, fluid was "drained" per pt/this was done at outpatient center/[facility]  . Stomach tuck    . TOTAL SHOULDER REPLACEMENT     FHx:     Reviewed / unchanged  SHx:    Reviewed / unchanged   Systems Review:  Constitutional: Denies fever, chills, wt changes, headaches, insomnia, fatigue, night sweats, change in appetite. Eyes: Denies redness, blurred vision, diplopia, discharge, itchy, watery eyes.  ENT: Denies discharge, congestion, post nasal drip, epistaxis, sore throat, earache, hearing loss, dental pain, tinnitus, vertigo, sinus pain, snoring.  CV: Denies chest pain, palpitations, irregular heartbeat, syncope, dyspnea, diaphoresis, orthopnea, PND, claudication or edema. Respiratory: denies cough, dyspnea, DOE, pleurisy, hoarseness, laryngitis, wheezing.  Gastrointestinal: Denies dysphagia, odynophagia, heartburn, reflux, water brash, abdominal pain or cramps, nausea, vomiting, bloating, diarrhea, constipation, hematemesis, melena, hematochezia  or hemorrhoids. Genitourinary: Denies dysuria, frequency, urgency, nocturia, hesitancy, discharge, hematuria or flank pain. Musculoskeletal: Denies arthralgias, myalgias, stiffness, jt. swelling, pain, limping or strain/sprain.  Skin: Denies pruritus, rash, hives, warts, acne, eczema or change in skin lesion(s). Neuro: No weakness, tremor, incoordination, spasms, paresthesia or pain. Psychiatric: Denies confusion, memory loss or sensory loss. Endo: Denies change in weight, skin or hair change.  Heme/Lymph: No excessive bleeding, bruising or enlarged lymph nodes.  Physical Exam  BP 132/88   Pulse 84   Temp (!) 97.3 F (36.3 C)   Resp 16   Ht 5' 2"  (1.575 m)   Wt 160 lb 9.6 oz (72.8 kg)   BMI 29.37 kg/m   Appears  well nourished, well groomed  and in no distress.  Eyes: PERRLA, EOMs, conjunctiva no swelling or erythema. Sinuses: No frontal/maxillary tenderness ENT/Mouth: EAC's clear, TM's nl w/o erythema, bulging. Nares clear w/o erythema, swelling, exudates. Oropharynx clear without erythema or exudates. Oral hygiene is good. Tongue normal, non obstructing. Hearing intact.   Neck: Supple. Thyroid not palpable. Car 2+/2+ without bruits, nodes or JVD. Chest: Respirations nl with BS clear & equal w/o rales, rhonchi, wheezing or stridor.  Cor: Heart sounds normal w/ regular rate and rhythm without sig. murmurs, gallops, clicks or rubs. Peripheral pulses normal and equal  without edema.  Abdomen: Soft & bowel sounds normal. Non-tender w/o guarding, rebound, hernias, masses or organomegaly.  Lymphatics: Unremarkable.  Musculoskeletal: Full ROM all peripheral extremities, joint stability, 5/5 strength and normal gait.  Skin: Warm, dry without exposed rashes, lesions or ecchymosis apparent.  Neuro: Cranial nerves intact, reflexes equal bilaterally. Sensory-motor testing grossly intact. Tendon reflexes grossly intact.  Pysch: Alert & oriented x 3.  Insight and judgement nl & appropriate. No ideations.  Assessment and Plan:  1. Labile hypertension  - Continue medication, monitor blood pressure at home.  - Continue DASH diet.  Reminder to go to the ER if any CP,  SOB, nausea, dizziness, severe HA, changes vision/speech.  - CBC with Differential/Platelet - COMPLETE METABOLIC PANEL WITH GFR - Magnesium - TSH  2. Hyperlipidemia, mixed  - Continue diet/meds, exercise,&  lifestyle modifications.  - Continue monitor periodic cholesterol/liver & renal functions   - Lipid panel - TSH  3. Prediabetes  - Continue diet, exercise, lifestyle modifications.  - Monitor appropriate labs.  - Hemoglobin A1c - Insulin, random  4. Vitamin D deficiency  - Continue supplementation.  - VITAMIN D 25 Hydroxy   5. Attention deficit disorder, unspecified hyperactivity presence   6. Gastroesophageal reflux disease  - CBC with Differential/Platelet  7. Medication management  - CBC with Differential/Platelet - COMPLETE METABOLIC PANEL WITH GFR - Magnesium - Lipid panel - TSH - Hemoglobin A1c - Insulin, random - VITAMIN D 25 Hydroxy       Discussed  regular  exercise, BP monitoring, weight control to achieve/maintain BMI less than 25 and discussed med and SE's. Recommended labs to assess and monitor clinical status with further disposition pending results of labs. Over 30 minutes of exam, counseling, chart review was performed.

## 2018-06-23 ENCOUNTER — Other Ambulatory Visit: Payer: Self-pay | Admitting: *Deleted

## 2018-06-23 ENCOUNTER — Ambulatory Visit (INDEPENDENT_AMBULATORY_CARE_PROVIDER_SITE_OTHER): Payer: BLUE CROSS/BLUE SHIELD | Admitting: Internal Medicine

## 2018-06-23 ENCOUNTER — Encounter: Payer: Self-pay | Admitting: Internal Medicine

## 2018-06-23 VITALS — BP 132/88 | HR 84 | Temp 97.3°F | Resp 16 | Ht 62.0 in | Wt 160.6 lb

## 2018-06-23 DIAGNOSIS — Z0001 Encounter for general adult medical examination with abnormal findings: Secondary | ICD-10-CM

## 2018-06-23 DIAGNOSIS — E559 Vitamin D deficiency, unspecified: Secondary | ICD-10-CM

## 2018-06-23 DIAGNOSIS — E782 Mixed hyperlipidemia: Secondary | ICD-10-CM

## 2018-06-23 DIAGNOSIS — K2100 Gastro-esophageal reflux disease with esophagitis, without bleeding: Secondary | ICD-10-CM

## 2018-06-23 DIAGNOSIS — Z79899 Other long term (current) drug therapy: Secondary | ICD-10-CM | POA: Diagnosis not present

## 2018-06-23 DIAGNOSIS — F988 Other specified behavioral and emotional disorders with onset usually occurring in childhood and adolescence: Secondary | ICD-10-CM

## 2018-06-23 DIAGNOSIS — R7303 Prediabetes: Secondary | ICD-10-CM

## 2018-06-23 DIAGNOSIS — R0989 Other specified symptoms and signs involving the circulatory and respiratory systems: Secondary | ICD-10-CM

## 2018-06-23 DIAGNOSIS — F900 Attention-deficit hyperactivity disorder, predominantly inattentive type: Secondary | ICD-10-CM

## 2018-06-23 MED ORDER — AMPHETAMINE-DEXTROAMPHET ER 20 MG PO CP24
20.0000 mg | ORAL_CAPSULE | ORAL | 0 refills | Status: DC
Start: 2018-06-23 — End: 2018-08-08

## 2018-06-23 MED ORDER — VITAMIN D (ERGOCALCIFEROL) 1.25 MG (50000 UNIT) PO CAPS: 50000.0000 [IU] | ORAL_CAPSULE | Freq: Every day | ORAL | 1 refills | Status: DC

## 2018-06-24 LAB — CBC WITH DIFFERENTIAL/PLATELET
BASOS PCT: 0.1 %
Basophils Absolute: 7 cells/uL (ref 0–200)
Eosinophils Absolute: 311 cells/uL (ref 15–500)
Eosinophils Relative: 4.2 %
HEMATOCRIT: 46.6 % — AB (ref 35.0–45.0)
Hemoglobin: 16.4 g/dL — ABNORMAL HIGH (ref 11.7–15.5)
LYMPHS ABS: 1843 {cells}/uL (ref 850–3900)
MCH: 33.5 pg — ABNORMAL HIGH (ref 27.0–33.0)
MCHC: 35.2 g/dL (ref 32.0–36.0)
MCV: 95.3 fL (ref 80.0–100.0)
MPV: 11.1 fL (ref 7.5–12.5)
Monocytes Relative: 10.1 %
NEUTROS PCT: 60.7 %
Neutro Abs: 4492 cells/uL (ref 1500–7800)
Platelets: 219 10*3/uL (ref 140–400)
RBC: 4.89 10*6/uL (ref 3.80–5.10)
RDW: 11.6 % (ref 11.0–15.0)
Total Lymphocyte: 24.9 %
WBC: 7.4 10*3/uL (ref 3.8–10.8)
WBCMIX: 747 {cells}/uL (ref 200–950)

## 2018-06-24 LAB — COMPLETE METABOLIC PANEL WITH GFR
AG RATIO: 2.1 (calc) (ref 1.0–2.5)
ALT: 41 U/L — AB (ref 6–29)
AST: 34 U/L (ref 10–35)
Albumin: 4.7 g/dL (ref 3.6–5.1)
Alkaline phosphatase (APISO): 82 U/L (ref 33–130)
BUN: 12 mg/dL (ref 7–25)
CALCIUM: 9.8 mg/dL (ref 8.6–10.4)
CO2: 29 mmol/L (ref 20–32)
Chloride: 100 mmol/L (ref 98–110)
Creat: 0.63 mg/dL (ref 0.50–1.05)
GFR, EST AFRICAN AMERICAN: 115 mL/min/{1.73_m2} (ref >=60)
GFR, EST NON AFRICAN AMERICAN: 100 mL/min/{1.73_m2} (ref >=60)
GLOBULIN: 2.2 g/dL (ref 1.9–3.7)
Glucose, Bld: 91 mg/dL (ref 65–99)
POTASSIUM: 4.2 mmol/L (ref 3.5–5.3)
Sodium: 140 mmol/L (ref 135–146)
TOTAL PROTEIN: 6.9 g/dL (ref 6.1–8.1)
Total Bilirubin: 0.5 mg/dL (ref 0.2–1.2)

## 2018-06-24 LAB — VITAMIN D 25 HYDROXY (VIT D DEFICIENCY, FRACTURES): Vit D, 25-Hydroxy: 54 ng/mL (ref 30–100)

## 2018-06-24 LAB — MAGNESIUM: Magnesium: 2.5 mg/dL (ref 1.5–2.5)

## 2018-06-24 LAB — HEMOGLOBIN A1C
EAG (MMOL/L): 4.9 (calc)
Hgb A1c MFr Bld: 4.7 % of total Hgb (ref <5.7)
Mean Plasma Glucose: 88 (calc)

## 2018-06-24 LAB — LIPID PANEL
CHOL/HDL RATIO: 2.4 (calc) (ref <5.0)
Cholesterol: 186 mg/dL (ref <200)
HDL: 77 mg/dL (ref >50)
LDL CHOLESTEROL (CALC): 92 mg/dL
Non-HDL Cholesterol (Calc): 109 mg/dL (calc) (ref <130)
Triglycerides: 79 mg/dL (ref <150)

## 2018-06-24 LAB — INSULIN, RANDOM: Insulin: 6.5 u[IU]/mL (ref 2.0–19.6)

## 2018-06-24 LAB — TSH: TSH: 2.1 mIU/L (ref 0.40–4.50)

## 2018-06-26 ENCOUNTER — Encounter: Payer: Self-pay | Admitting: Internal Medicine

## 2018-08-08 ENCOUNTER — Other Ambulatory Visit: Payer: Self-pay | Admitting: Internal Medicine

## 2018-08-08 DIAGNOSIS — F900 Attention-deficit hyperactivity disorder, predominantly inattentive type: Secondary | ICD-10-CM

## 2018-08-08 MED ORDER — AMPHETAMINE-DEXTROAMPHET ER 20 MG PO CP24: 20.0000 mg | ORAL_CAPSULE | ORAL | 0 refills | Status: DC

## 2018-08-09 ENCOUNTER — Telehealth: Payer: Self-pay | Admitting: *Deleted

## 2018-08-09 NOTE — Telephone Encounter (Signed)
A Fax was sent to [facility] to cancel the prior auth on Adderral XR, due to the generic was not cover and the preferred RX for brand was sent to her pharmacy.

## 2018-09-22 ENCOUNTER — Ambulatory Visit (INDEPENDENT_AMBULATORY_CARE_PROVIDER_SITE_OTHER): Payer: BLUE CROSS/BLUE SHIELD | Admitting: Physician Assistant

## 2018-09-22 ENCOUNTER — Encounter: Payer: Self-pay | Admitting: Physician Assistant

## 2018-09-22 VITALS — BP 128/64 | HR 78 | Temp 97.8°F | Resp 16 | Wt 163.0 lb

## 2018-09-22 DIAGNOSIS — I10 Essential (primary) hypertension: Secondary | ICD-10-CM

## 2018-09-22 DIAGNOSIS — R7309 Other abnormal glucose: Secondary | ICD-10-CM

## 2018-09-22 DIAGNOSIS — G40309 Generalized idiopathic epilepsy and epileptic syndromes, not intractable, without status epilepticus: Secondary | ICD-10-CM

## 2018-09-22 DIAGNOSIS — Z79899 Other long term (current) drug therapy: Secondary | ICD-10-CM

## 2018-09-22 DIAGNOSIS — E559 Vitamin D deficiency, unspecified: Secondary | ICD-10-CM

## 2018-09-22 DIAGNOSIS — K7581 Nonalcoholic steatohepatitis (NASH): Secondary | ICD-10-CM

## 2018-09-22 DIAGNOSIS — E782 Mixed hyperlipidemia: Secondary | ICD-10-CM

## 2018-09-22 NOTE — Progress Notes (Signed)
Assessment and Plan:  ONLY NEEDS TO BE SEEN EVERY 6 MONTHS  Future Appointments  Date Time Provider [place]  12/08/2018  3:45 PM Unk Pinto, MD [facility] None    Continue diet and meds as discussed. Further disposition pending results of labs. Over 30 minutes of exam, counseling, chart review, and critical decision making was performed  HPI 57 y.o. female  presents for 3 month follow up on hypertension, cholesterol, prediabetes, and vitamin D deficiency.   Her blood pressure has been controlled at home, today their BP is BP: 128/64  She does not workout. 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.  She denies chest pain, shortness of breath, dizziness.  She is not on cholesterol medication and denies myalgias. Her cholesterol is at goal. The cholesterol last visit was:   Lab Results  Component Value Date   CHOL 186 06/23/2018   HDL 77 06/23/2018   LDLCALC 92 06/23/2018   TRIG 79 06/23/2018   CHOLHDL 2.4 06/23/2018    She has been working on diet and exercise for prediabetes, she has lost 100 + lbs s/p gastric bypass in 2000 and has a history of reactive hypoglycemia with secondary seizures, and denies hypoglycemia , paresthesia of the feet, polydipsia, polyuria and visual disturbances. Last A1C in the office was:  Lab Results  Component Value Date   HGBA1C 4.7 06/23/2018   Patient is on Vitamin D supplement, 50,000 5 days a week.  Lab Results  Component Value Date   VD25OH 54 06/23/2018     She has history of NASH via biopsy, follows with Dr. Deatra Ina. Lab Results  Component Value Date   ALT 41 (H) 06/23/2018   AST 34 06/23/2018   ALKPHOS 65 06/03/2017   BILITOT 0.5 06/23/2018   She has chronic lower back pain, follows with Dr. Mina Marble. Sees Dr. Berenice Primas for her knees.   BMI is Body mass index is 29.81 kg/m., she is working on diet and exercise. Wt Readings from Last 8 Encounters:  09/22/18 163 lb (73.9 kg)  06/23/18 160 lb 9.6 oz (72.8 kg)  04/11/18  154 lb (69.9 kg)  03/17/18 159 lb (72.1 kg)  01/28/18 157 lb (71.2 kg)  01/14/18 157 lb (71.2 kg)  12/02/17 157 lb 6.4 oz (71.4 kg)  09/30/17 157 lb 12.8 oz (71.6 kg)    Current Medications:  Current Outpatient Medications on File Prior to Visit  Medication Sig Dispense Refill  . Arginine 500 MG CAPS Take 500 mg by mouth daily.    Marland Kitchen aspirin 81 MG tablet Take 81 mg by mouth daily.    Marland Kitchen b complex vitamins tablet Take 1 tablet by mouth daily.    . IRON, FERROUS SULFATE, PO Take 65 mg by mouth daily.    . milk thistle 175 MG tablet Take 175 mg by mouth daily.    . Multiple Vitamin (MULTIVITAMIN) tablet Take 1 tablet by mouth daily.     . NON FORMULARY Deep Blue Doterra Oil    . NON FORMULARY DDR, cellular complex    . NON FORMULARY Zendocrine    . NON FORMULARY Mitro 2 Max    . NON FORMULARY XEO Mega, omega complex    . NON FORMULARY Tri Ease Seasonal Blend    . NON FORMULARY Bone Nutrient    . NON FORMULARY Keratin    . NON FORMULARY Collagen Forensic psychologist    . polycarbophil (FIBERCON) 625 MG tablet Take 625 mg by mouth daily.    Marland Kitchen  TURMERIC PO Take 1,500 mg by mouth 2 (two) times daily.    . Vitamin D, Ergocalciferol, (DRISDOL) 50000 units CAPS capsule Take 1 capsule (50,000 Units total) by mouth daily. 90 capsule 1  . amphetamine-dextroamphetamine (ADDERALL XR) 20 MG 24 hr capsule Take 1 capsule (20 mg total) by mouth every morning. 90 capsule 0   No current facility-administered medications on file prior to visit.    Medical History:  Past Medical History:  Diagnosis Date  . ADD (attention deficit disorder) 07/19/2013  . Arthritis   . Cholecystitis, acute with cholelithiasis 07/19/2013  . Chronic left shoulder pain 07/19/2013  . Dyspnea   . GERD (gastroesophageal reflux disease) 07/19/2013  . Goiter   . HTN (hypertension)   . Hypoglycemia   . Hypoglycemia 07/19/2013  . Murmur   . Pre-diabetes   . Seizure disorder, primary generalized ([place]) 07/19/2013   May be related to  hypoglycemia  . Seizures ([place]) last 01/08/18   2013-grand mal due to hypoglycemia  . Sleep apnea    "mild"-not using CPAP per pt   Allergies:  Allergies  Allergen Reactions  . Latex Rash     Review of Systems:  Review of Systems  Constitutional: Negative.   HENT: Negative.   Eyes: Negative.   Respiratory: Negative.   Cardiovascular: Negative.   Gastrointestinal: Negative.   Genitourinary: Negative.   Musculoskeletal: Positive for back pain, joint pain and myalgias. Negative for falls and neck pain.  Skin: Negative.   Neurological: Negative.   Endo/Heme/Allergies: Negative.   Psychiatric/Behavioral: Negative.     Family history- Review and unchanged Social history- Review and unchanged Physical Exam: BP 128/64   Pulse 78   Temp 97.8 F (36.6 C)   Resp 16   Wt 163 lb (73.9 kg)   SpO2 98%   BMI 29.81 kg/m  Wt Readings from Last 3 Encounters:  09/22/18 163 lb (73.9 kg)  06/23/18 160 lb 9.6 oz (72.8 kg)  04/11/18 154 lb (69.9 kg)   General Appearance: Well nourished, in no apparent distress. Eyes: PERRLA, EOMs, conjunctiva no swelling or erythema Sinuses: No Frontal/maxillary tenderness ENT/Mouth: Ext aud canals clear, TMs without erythema, bulging. No erythema, swelling, or exudate on post pharynx.  Tonsils not swollen or erythematous. Hearing normal.  Neck: Supple, thyroid normal.  Respiratory: Respiratory effort normal, BS equal bilaterally without rales, rhonchi, wheezing or stridor.  Cardio: RRR with no MRGs. Brisk peripheral pulses without edema.  Abdomen: Soft, + BS,  Non tender, no guarding, rebound, hernias, masses. Lymphatics: Non tender without lymphadenopathy.  Musculoskeletal: Full ROM, 5/5 strength, Normal gait Skin: Warm, dry without rashes, lesions, ecchymosis.  Neuro: Cranial nerves intact. Normal muscle tone, no cerebellar symptoms. Psych: Awake and oriented X 3, normal affect, Insight and Judgment appropriate.    Vicie Mutters, PA-C 3:44  PM [facility]

## 2018-10-09 IMAGING — CR DG CHEST 2V
2 series · 2 of 2 positions shown · non-contrast
Comparison: 09/16/2012

CLINICAL DATA: Severe acid reflux on 09/24/2017 leaning to
vomiting, chest pain and pressure, wheezing, cough, and shortness of
breath since episode, history hypertension

EXAM:
CHEST  2 VIEW

[chest pa]
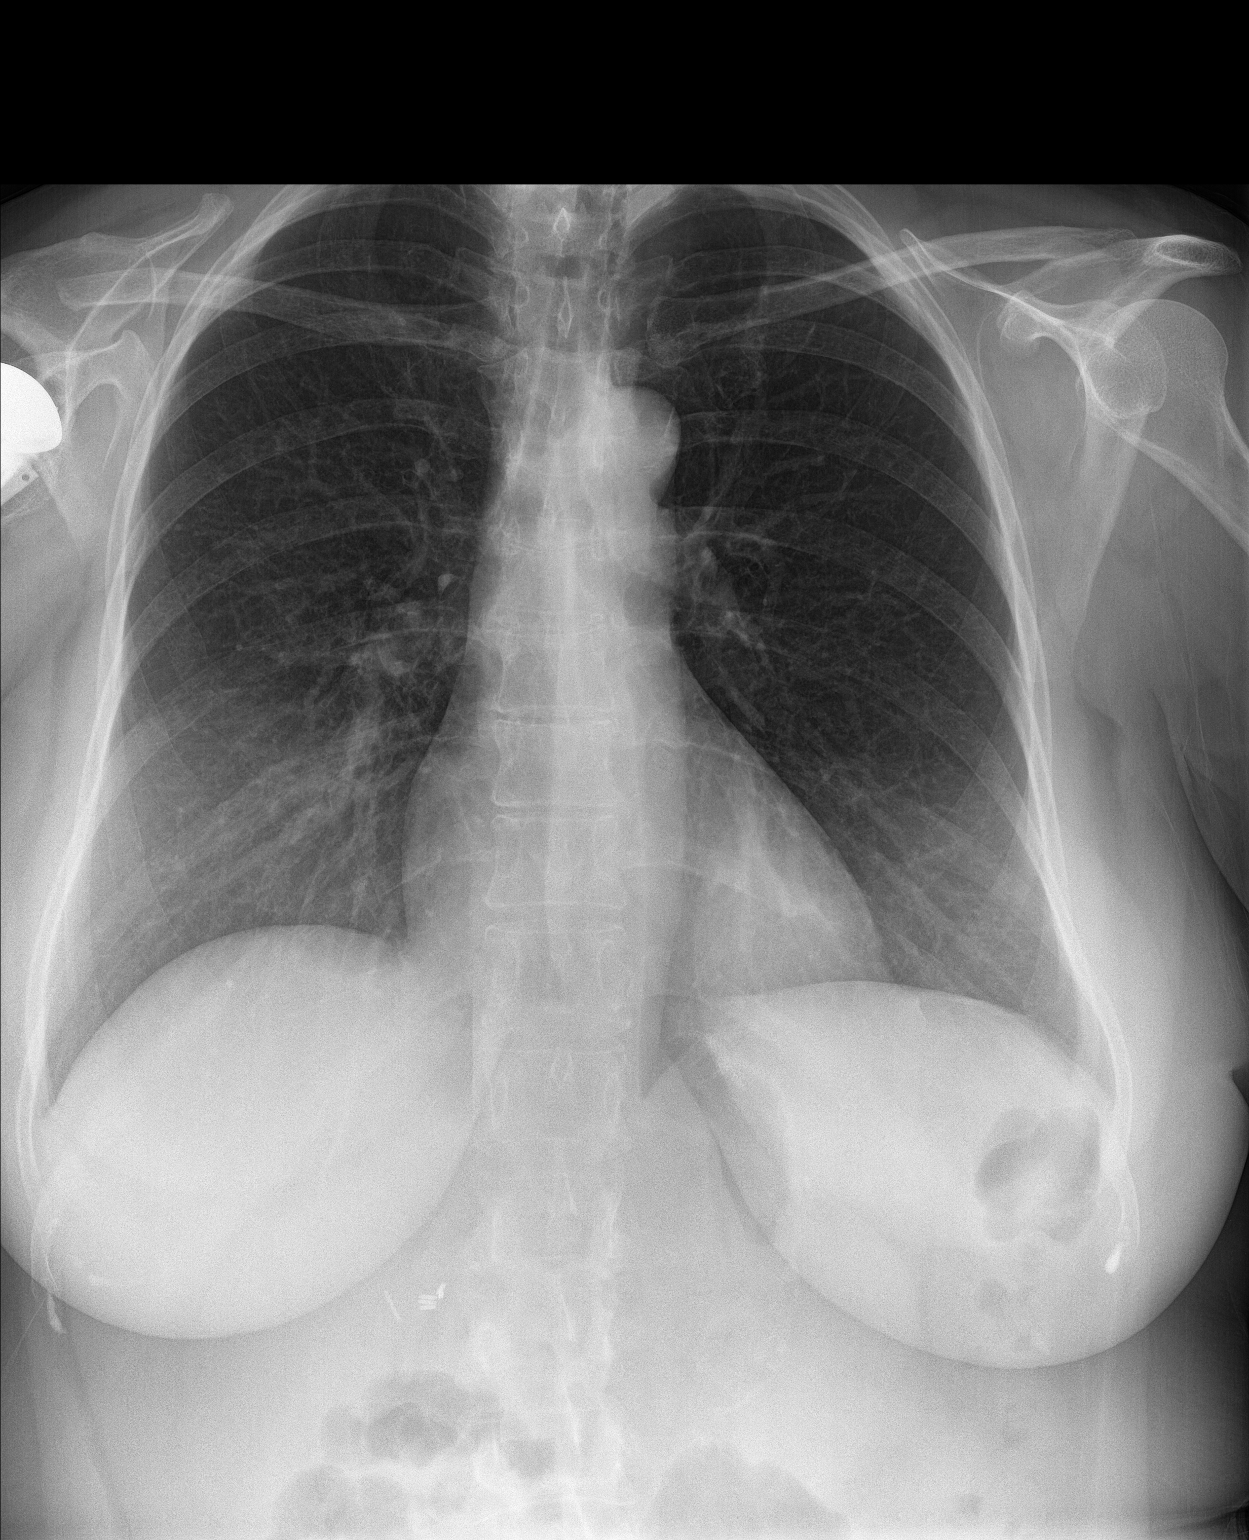

[chest lat]
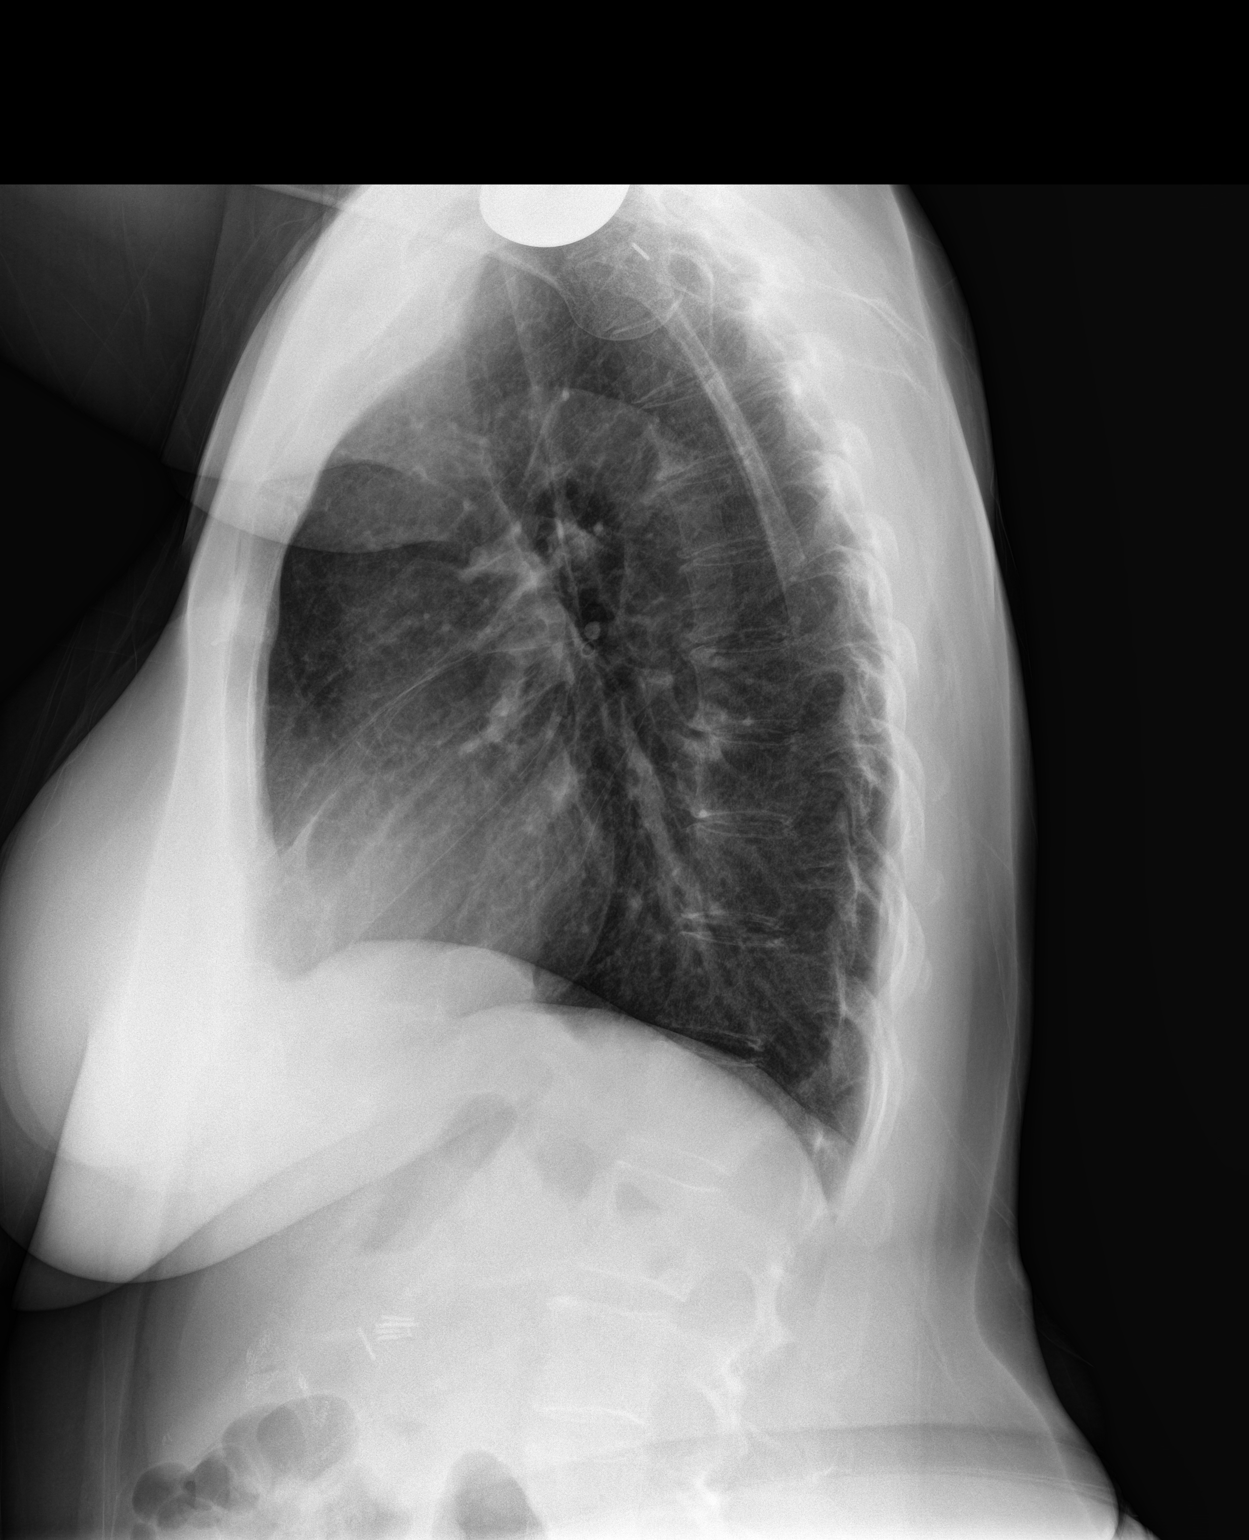

[2 of 2 positions shown; findings below may reference images not displayed]

FINDINGS: Normal heart size, mediastinal contours, and pulmonary vascularity.

Lungs mildly hyperaerated but clear.

No infiltrate, pleural effusion or pneumothorax.

Diffuse osseous demineralization with note of a RIGHT shoulder
prosthesis.

Surgical clips RIGHT upper quadrant question prior cholecystectomy.
IMPRESSION: No acute abnormalities.

## 2018-11-15 ENCOUNTER — Other Ambulatory Visit: Payer: Self-pay | Admitting: Internal Medicine

## 2018-11-15 DIAGNOSIS — F900 Attention-deficit hyperactivity disorder, predominantly inattentive type: Secondary | ICD-10-CM

## 2018-11-15 MED ORDER — AMPHETAMINE-DEXTROAMPHET ER 20 MG PO CP24: ORAL_CAPSULE | ORAL | 0 refills | Status: DC

## 2018-12-07 ENCOUNTER — Encounter: Payer: Self-pay | Admitting: Internal Medicine

## 2018-12-07 NOTE — Progress Notes (Signed)
[facility] Unk Pinto, M.D.     Uvaldo Bristle. Silverio Lay, P.A.-C Liane Comber, [place] [address] [place], [place] [address] Telephone [phone] Telefax [phone] Annual Screening/Preventative Visit & Comprehensive Evaluation &  Examination     This very nice 57 y.o. MWF  presents for a Screening /Preventative Visit & comprehensive evaluation and management of multiple medical co-morbidities.  Patient has been followed for HTN, HLD, Prediabetes  and Vitamin D Deficiency. Patient has hx/o GERD currently off meds and controlled with diet. Patient has DDD and has received EDSI per Dr Mina Marble and she is following with Dr Berenice Primas & she's deferring  a Lt TKR as long as possible. She currently wears a knee brace for stability.       Patient has hx/o labile HTN followed expectantly circa 2000. Patient's BP has been controlled at home and patient denies any cardiac symptoms as chest pain, palpitations, shortness of breath, dizziness or ankle swelling. Today's        Patient's hyperlipidemia is controlled with diet and medications. Patient denies myalgias or other medication SE's. Last lipids were  Lab Results  Component Value Date   CHOL 186 06/23/2018   HDL 77 06/23/2018   LDLCALC 92 06/23/2018   TRIG 79 06/23/2018   CHOLHDL 2.4 06/23/2018      Patient has hx/o Gastric Bipass in 2000 and did at one time have episodes of hypoglycemic induced seizures. Patient denies recent reactive hypoglycemic symptoms, visual blurring, diabetic polys or paresthesias. Last A1c was at goal: Lab Results  Component Value Date   HGBA1C 4.7 06/23/2018      Finally, patient has history of Vitamin D Deficiency ("27" / 2009)  and last Vitamin D was near goal; Lab Results  Component Value Date   VD25OH 1 06/23/2018   Current Outpatient Medications on File Prior to Visit  Medication Sig  . amphetamine-dextroamphetamine (ADDERALL XR)  20 MG 24 hr capsule Take 1 capsule daily for focus & concentration & recommend try limit to 5 days /week  . Arginine 500 MG CAPS Take 500 mg by mouth daily.  Marland Kitchen aspirin 81 MG tablet Take 81 mg by mouth daily.  Marland Kitchen b complex vitamins tablet Take 1 tablet by mouth daily.  . IRON, FERROUS SULFATE, PO Take 65 mg by mouth daily.  . milk thistle 175 MG tablet Take 175 mg by mouth daily.  . Multiple Vitamin (MULTIVITAMIN) tablet Take 1 tablet by mouth daily.   . NON FORMULARY Deep Blue Doterra Oil  . NON FORMULARY DDR, cellular complex  . NON FORMULARY Zendocrine  . NON FORMULARY Mitro 2 Max  . NON FORMULARY XEO Mega, omega complex  . NON FORMULARY Tri Ease Seasonal Blend  . NON FORMULARY Bone Nutrient  . NON FORMULARY Keratin  . NON FORMULARY Collagen Forensic psychologist  . polycarbophil (FIBERCON) 625 MG tablet Take 625 mg by mouth daily.  . TURMERIC PO Take 1,500 mg by mouth 2 (two) times daily.  . Vitamin D, Ergocalciferol, (DRISDOL) 50000 units CAPS capsule Take 1 capsule (50,000 Units total) by mouth daily.   No current facility-administered medications on file prior to visit.    Allergies  Allergen Reactions  . Latex Rash   Past Medical History:  Diagnosis Date  . ADD (attention deficit disorder) 07/19/2013  . Arthritis   . Cholecystitis, acute with cholelithiasis 07/19/2013  . Chronic left shoulder pain 07/19/2013  . Dyspnea   . GERD (gastroesophageal reflux disease)  07/19/2013  . Goiter   . HTN (hypertension)   . Hypoglycemia   . Hypoglycemia 07/19/2013  . Murmur   . Pre-diabetes   . Seizure disorder, primary generalized ([place]) 07/19/2013   May be related to hypoglycemia  . Seizures ([place]) last 01/08/18   2013-grand mal due to hypoglycemia  . Sleep apnea    "mild"-not using CPAP per pt   Health Maintenance  Topic Date Due  . PAP SMEAR-Modifier  10/02/1981  . MAMMOGRAM  10/16/2016  . INFLUENZA VACCINE  07/14/2018  . TETANUS/TDAP  11/07/2019  . COLONOSCOPY  01/29/2028  . Hepatitis C  Screening  Completed  . HIV Screening  Completed   Immunization History  Administered Date(s) Administered  . Influenza-Unspecified 09/15/2016  . PPD Test 11/30/2016  . Pneumococcal Polysaccharide-23 12/14/1998  . Tdap 11/06/2009   Last Colon - 01/28/2018 - Dr Loletha Carrow - Recc f/u 10 yr - due Feb 2029  Last MGM - 05/10/2013 -  Past Surgical History:  Procedure Laterality Date  . ABDOMINAL HYSTERECTOMY    . BREAST ENHANCEMENT SURGERY    . CHOLECYSTECTOMY N/A 07/20/2013   Procedure: LAPAROSCOPIC CHOLECYSTECTOMY WITH INTRAOPERATIVE CHOLANGIOGRAM;  Surgeon: Shann Medal, MD;  Location: [facility];  Service: General;  Laterality: N/A;  . GASTRIC BYPASS    . GASTRIC BYPASS    . GASTRIC BYPASS  2000  . leg lift  2008   had aspiration in lungs, fluid was "drained" per pt/this was done at outpatient center/[facility]  . Stomach tuck    . TOTAL SHOULDER REPLACEMENT     Family History  Problem Relation Age of Onset  . Coronary artery disease Father        MI at age 63  . Heart disease Father   . Hypertension Father   . Diabetes Father   . Skin cancer Mother   . Hypertension Mother   . Hyperlipidemia Mother   . Breast cancer Maternal Aunt   . Cancer Maternal Aunt   . Breast cancer Paternal Aunt   . Breast cancer Paternal Uncle   . Cancer Cousin   . Colon cancer Neg Hx    Social History   Tobacco Use  . Smoking status: Never Smoker  . Smokeless tobacco: Never Used  Substance Use Topics  . Alcohol use: Yes    Alcohol/week: 3.0 standard drinks    Types: 1 Standard drinks or equivalent, 2 Glasses of wine per week    Comment: Occasional/social   . Drug use: No    ROS Constitutional: Denies fever, chills, weight loss/gain, headaches, insomnia,  night sweats, and change in appetite. Does c/o fatigue. Eyes: Denies redness, blurred vision, diplopia, discharge, itchy, watery eyes.  ENT: Denies discharge, congestion, post nasal drip, epistaxis, sore throat, earache, hearing loss, dental pain,  Tinnitus, Vertigo, Sinus pain, snoring.  Cardio: Denies chest pain, palpitations, irregular heartbeat, syncope, dyspnea, diaphoresis, orthopnea, PND, claudication, edema Respiratory: denies cough, dyspnea, DOE, pleurisy, hoarseness, laryngitis, wheezing.  Gastrointestinal: Denies dysphagia, heartburn, reflux, water brash, pain, cramps, nausea, vomiting, bloating, diarrhea, constipation, hematemesis, melena, hematochezia, jaundice, hemorrhoids Genitourinary: Denies dysuria, frequency, urgency, nocturia, hesitancy, discharge, hematuria, flank pain Breast: Breast lumps, nipple discharge, bleeding.  Musculoskeletal: Denies arthralgia, myalgia, stiffness, Jt. Swelling, pain, limp, and strain/sprain. Denies falls. Skin: Denies puritis, rash, hives, warts, acne, eczema, changing in skin lesion Neuro: No weakness, tremor, incoordination, spasms, paresthesia, pain Psychiatric: Denies confusion, memory loss, sensory loss. Denies Depression. Endocrine: Denies change in weight, skin, hair change, nocturia, and paresthesia, diabetic polys, visual  blurring, hyper / hypo glycemic episodes.  Heme/Lymph: No excessive bleeding, bruising, enlarged lymph nodes.  Physical Exam  There were no vitals taken for this visit.  General Appearance: Well nourished, well groomed and in no apparent distress.  Eyes: PERRLA, EOMs, conjunctiva no swelling or erythema, normal fundi and vessels. Sinuses: No frontal/maxillary tenderness ENT/Mouth: EACs patent / TMs  nl. Nares clear without erythema, swelling, mucoid exudates. Oral hygiene is good. No erythema, swelling, or exudate. Tongue normal, non-obstructing. Tonsils not swollen or erythematous. Hearing normal.  Neck: Supple, thyroid not palpable. No bruits, nodes or JVD. Respiratory: Respiratory effort normal.  BS equal and clear bilateral without rales, rhonci, wheezing or stridor. Cardio: Heart sounds are normal with regular rate and rhythm and no murmurs, rubs or gallops.  Peripheral pulses are normal and equal bilaterally without edema. No aortic or femoral bruits. Chest: symmetric with normal excursions and percussion. Breasts: Symmetric, without lumps, nipple discharge, retractions, or fibrocystic changes.  Abdomen: Flat, soft with bowel sounds active. Nontender, no guarding, rebound, hernias, masses, or organomegaly.  Lymphatics: Non tender without lymphadenopathy.  Musculoskeletal: Full ROM all peripheral extremities, joint stability, 5/5 strength, and normal gait. Skin: Warm and dry without rashes, lesions, cyanosis, clubbing or  ecchymosis.  Neuro: Cranial nerves intact, reflexes equal bilaterally. Normal muscle tone, no cerebellar symptoms. Sensation intact.  Pysch: Alert and oriented X 3, normal affect, Insight and Judgment appropriate.   Assessment and Plan  1. Annual Preventative Screening Examination  2. Labile hypertension  - EKG 12-Lead - [place], RETROPERITNL ABD,  LTD - Urinalysis, Routine w reflex microscopic - Microalbumin / creatinine urine ratio - CBC with Differential/Platelet - COMPLETE METABOLIC PANEL WITH GFR - Magnesium - TSH  3. Hyperlipidemia, mixed  - EKG 12-Lead - [place], RETROPERITNL ABD,  LTD - Lipid panel - TSH  4. Prediabetes  - EKG 12-Lead - [place], RETROPERITNL ABD,  LTD - Hemoglobin A1c - Insulin, random  5. Vitamin D deficiency  - VITAMIN D 25 HydroxyL  6. Attention deficit hyperactivity disorder (ADHD), predominantly inattentive type  7. Gastroesophageal reflux disease, esophagitis presence not specified  - CBC with Differential/Platelet  8. Screening for colorectal cancer   9. Screening for ischemic heart disease  - EKG 12-Lead  10. FHx: heart disease  - EKG 12-Lead - [place], RETROPERITNL ABD,  LTD  11. Screening for AAA (aortic abdominal aneurysm)  - [place], RETROPERITNL ABD,  LTD  12. Screening examination for pulmonary tuberculosis  - TB Skin Test  13. Fatigue  - Iron,Total/Total Iron Binding  Cap - Vitamin B12  14. Medication management  - Urinalysis, Routine w reflex microscopic - Microalbumin / creatinine urine ratio - CBC with Differential/Platelet - COMPLETE METABOLIC PANEL WITH GFR - Magnesium - Lipid panel - TSH - Hemoglobin A1c - Insulin, random - VITAMIN D 25 HydroxyL        Patient was counseled in prudent diet to achieve/maintain BMI less than 25 for weight control, BP monitoring, regular exercise and medications. Discussed med's effects and SE's. Screening labs and tests as requested with regular follow-up as recommended. Over 40 minutes of exam, counseling, chart review and high complex critical decision making was performed.

## 2018-12-07 NOTE — Patient Instructions (Signed)

## 2018-12-08 ENCOUNTER — Encounter: Payer: Self-pay | Admitting: Internal Medicine

## 2018-12-08 ENCOUNTER — Ambulatory Visit (INDEPENDENT_AMBULATORY_CARE_PROVIDER_SITE_OTHER): Payer: BLUE CROSS/BLUE SHIELD | Admitting: Internal Medicine

## 2018-12-08 VITALS — BP 146/90 | HR 92 | Temp 97.7°F | Resp 16 | Ht 61.25 in | Wt 163.0 lb

## 2018-12-08 DIAGNOSIS — Z130 Encounter for screening for diseases of the blood and blood-forming organs and certain disorders involving the immune mechanism: Secondary | ICD-10-CM

## 2018-12-08 DIAGNOSIS — R0989 Other specified symptoms and signs involving the circulatory and respiratory systems: Secondary | ICD-10-CM

## 2018-12-08 DIAGNOSIS — Z1389 Encounter for screening for other disorder: Secondary | ICD-10-CM | POA: Diagnosis not present

## 2018-12-08 DIAGNOSIS — E559 Vitamin D deficiency, unspecified: Secondary | ICD-10-CM

## 2018-12-08 DIAGNOSIS — R5383 Other fatigue: Secondary | ICD-10-CM

## 2018-12-08 DIAGNOSIS — Z111 Encounter for screening for respiratory tuberculosis: Secondary | ICD-10-CM | POA: Diagnosis not present

## 2018-12-08 DIAGNOSIS — Z0001 Encounter for general adult medical examination with abnormal findings: Secondary | ICD-10-CM

## 2018-12-08 DIAGNOSIS — Z1211 Encounter for screening for malignant neoplasm of colon: Secondary | ICD-10-CM

## 2018-12-08 DIAGNOSIS — Z13220 Encounter for screening for lipoid disorders: Secondary | ICD-10-CM | POA: Diagnosis not present

## 2018-12-08 DIAGNOSIS — R7303 Prediabetes: Secondary | ICD-10-CM

## 2018-12-08 DIAGNOSIS — Z1212 Encounter for screening for malignant neoplasm of rectum: Secondary | ICD-10-CM

## 2018-12-08 DIAGNOSIS — Z79899 Other long term (current) drug therapy: Secondary | ICD-10-CM

## 2018-12-08 DIAGNOSIS — E782 Mixed hyperlipidemia: Secondary | ICD-10-CM

## 2018-12-08 DIAGNOSIS — Z131 Encounter for screening for diabetes mellitus: Secondary | ICD-10-CM | POA: Diagnosis not present

## 2018-12-08 DIAGNOSIS — Z1329 Encounter for screening for other suspected endocrine disorder: Secondary | ICD-10-CM

## 2018-12-08 DIAGNOSIS — Z0000 Encounter for general adult medical examination without abnormal findings: Secondary | ICD-10-CM | POA: Diagnosis not present

## 2018-12-08 DIAGNOSIS — K219 Gastro-esophageal reflux disease without esophagitis: Secondary | ICD-10-CM

## 2018-12-08 DIAGNOSIS — Z8249 Family history of ischemic heart disease and other diseases of the circulatory system: Secondary | ICD-10-CM | POA: Diagnosis not present

## 2018-12-08 DIAGNOSIS — F900 Attention-deficit hyperactivity disorder, predominantly inattentive type: Secondary | ICD-10-CM

## 2018-12-08 DIAGNOSIS — Z136 Encounter for screening for cardiovascular disorders: Secondary | ICD-10-CM

## 2018-12-09 LAB — LIPID PANEL
Cholesterol: 160 mg/dL (ref <200)
HDL: 63 mg/dL (ref >50)
LDL Cholesterol (Calc): 81 mg/dL (calc)
Non-HDL Cholesterol (Calc): 97 mg/dL (calc) (ref <130)
Total CHOL/HDL Ratio: 2.5 (calc) (ref <5.0)
Triglycerides: 81 mg/dL (ref <150)

## 2018-12-09 LAB — URINALYSIS, ROUTINE W REFLEX MICROSCOPIC
Bilirubin Urine: NEGATIVE
Hgb urine dipstick: NEGATIVE
Ketones, ur: NEGATIVE
Leukocytes, UA: NEGATIVE
NITRITE: NEGATIVE
Protein, ur: NEGATIVE
Specific Gravity, Urine: 1.031 (ref 1.001–1.03)
pH: 5.5 (ref 5.0–8.0)

## 2018-12-09 LAB — CBC WITH DIFFERENTIAL/PLATELET
Absolute Monocytes: 672 cells/uL (ref 200–950)
Basophils Absolute: 9 cells/uL (ref 0–200)
Basophils Relative: 0.2 %
Eosinophils Absolute: 89 cells/uL (ref 15–500)
Eosinophils Relative: 1.9 %
HCT: 45.3 % — ABNORMAL HIGH (ref 35.0–45.0)
Hemoglobin: 15.5 g/dL (ref 11.7–15.5)
Lymphs Abs: 635 cells/uL — ABNORMAL LOW (ref 850–3900)
MCH: 33.1 pg — ABNORMAL HIGH (ref 27.0–33.0)
MCHC: 34.2 g/dL (ref 32.0–36.0)
MCV: 96.8 fL (ref 80.0–100.0)
MPV: 11 fL (ref 7.5–12.5)
Monocytes Relative: 14.3 %
Neutro Abs: 3295 cells/uL (ref 1500–7800)
Neutrophils Relative %: 70.1 %
Platelets: 198 10*3/uL (ref 140–400)
RBC: 4.68 10*6/uL (ref 3.80–5.10)
RDW: 11.4 % (ref 11.0–15.0)
Total Lymphocyte: 13.5 %
WBC: 4.7 10*3/uL (ref 3.8–10.8)

## 2018-12-09 LAB — COMPLETE METABOLIC PANEL WITH GFR
AG Ratio: 2 (calc) (ref 1.0–2.5)
ALT: 30 U/L — ABNORMAL HIGH (ref 6–29)
AST: 28 U/L (ref 10–35)
Albumin: 4.3 g/dL (ref 3.6–5.1)
Alkaline phosphatase (APISO): 77 U/L (ref 33–130)
BUN: 11 mg/dL (ref 7–25)
CO2: 26 mmol/L (ref 20–32)
Calcium: 9.5 mg/dL (ref 8.6–10.4)
Chloride: 102 mmol/L (ref 98–110)
Creat: 0.6 mg/dL (ref 0.50–1.05)
GFR, Est African American: 116 mL/min/{1.73_m2} (ref >=60)
GFR, Est Non African American: 100 mL/min/{1.73_m2} (ref >=60)
Globulin: 2.2 g/dL (calc) (ref 1.9–3.7)
Glucose, Bld: 81 mg/dL (ref 65–99)
POTASSIUM: 3.8 mmol/L (ref 3.5–5.3)
Sodium: 141 mmol/L (ref 135–146)
Total Bilirubin: 0.5 mg/dL (ref 0.2–1.2)
Total Protein: 6.5 g/dL (ref 6.1–8.1)

## 2018-12-09 LAB — MICROALBUMIN / CREATININE URINE RATIO
Creatinine, Urine: 114 mg/dL (ref 20–275)
Microalb Creat Ratio: 4 mcg/mg creat (ref <30)
Microalb, Ur: 0.5 mg/dL

## 2018-12-09 LAB — TSH: TSH: 1.55 mIU/L (ref 0.40–4.50)

## 2018-12-09 LAB — HEMOGLOBIN A1C
HEMOGLOBIN A1C: 4.8 %{Hb} (ref <5.7)
Mean Plasma Glucose: 91 (calc)
eAG (mmol/L): 5 (calc)

## 2018-12-09 LAB — VITAMIN D 25 HYDROXY (VIT D DEFICIENCY, FRACTURES): Vit D, 25-Hydroxy: 76 ng/mL (ref 30–100)

## 2018-12-09 LAB — IRON, TOTAL/TOTAL IRON BINDING CAP
%SAT: 21 % (calc) (ref 16–45)
Iron: 64 ug/dL (ref 45–160)
TIBC: 311 mcg/dL (calc) (ref 250–450)

## 2018-12-09 LAB — VITAMIN B12: Vitamin B-12: 494 pg/mL (ref 200–1100)

## 2018-12-09 LAB — MAGNESIUM: Magnesium: 2.3 mg/dL (ref 1.5–2.5)

## 2018-12-09 LAB — INSULIN, RANDOM: Insulin: 7.2 u[IU]/mL (ref 2.0–19.6)

## 2019-01-05 ENCOUNTER — Encounter: Payer: Self-pay | Admitting: Internal Medicine

## 2019-02-28 DIAGNOSIS — R509 Fever, unspecified: Secondary | ICD-10-CM

## 2019-03-01 ENCOUNTER — Other Ambulatory Visit: Payer: Self-pay | Admitting: Internal Medicine

## 2019-03-01 ENCOUNTER — Other Ambulatory Visit: Payer: Self-pay | Admitting: Physician Assistant

## 2019-03-01 DIAGNOSIS — R509 Fever, unspecified: Secondary | ICD-10-CM

## 2019-03-08 ENCOUNTER — Other Ambulatory Visit: Payer: Self-pay | Admitting: Internal Medicine

## 2019-03-09 ENCOUNTER — Encounter: Payer: Self-pay | Admitting: Physician Assistant

## 2019-03-16 LAB — NOVEL CORONAVIRUS, NAA: SARS-CoV-2, NAA: NOT DETECTED

## 2019-06-12 NOTE — Progress Notes (Signed)
Assessment and Plan:  Essential hypertension - continue medications, DASH diet, exercise and monitor at home. Call if greater than 130/80.  -     CBC with Differential/Platelet -     COMPLETE METABOLIC PANEL WITH GFR -     Lipid panel  Nonintractable generalized idiopathic epilepsy without status epilepticus ([place]) Continue keppra  Mixed hyperlipidemia check lipids decrease fatty foods increase activity.   Other abnormal glucose Discussed disease progression and risks Discussed diet/exercise, weight management and risk modification  Medication management -     Magnesium  Vitamin D deficiency -     VITAMIN D 25 Hydroxy (Vit-D Deficiency, Fractures)  Palpitation -     TSH -     EKG 12-Lead Palpitations- check labs, r/u anemia, TSH, dehydration, electrolytes imbalance Check EKG, PPI for 2 weeks, refer to cardiology if it happens again, patient declines at this time Increase water, decrease alcohol/caffeine, valsalva maneuvers taught  Anemia, unspecified type -     Iron,Total/Total Iron Binding Cap -     Ferritin -     Vitamin B12     Future Appointments  Date Time Provider Vayas  01/25/2020  3:00 PM Unk Pinto, MD [facility] None    Continue diet and meds as discussed. Further disposition pending results of labs. Over 30 minutes of exam, counseling, chart review, and critical decision making was performed  HPI 58 y.o. female  presents for 3 month follow up on hypertension, cholesterol, prediabetes, and vitamin D deficiency.    Her blood pressure has been controlled at home, today their BP is BP: 120/66  She does not workout.  SHE IS NOT GOING TO WORK ANYMORE SINCE DR. Delman Cheadle IS CLOSING HIS PRACTICE, SHE IS VERY SAD ABOUT THIS, WILL MISS DR. Delman Cheadle.    STATES WITH STRESS LAST 2 MONTHS HAS HAD INCREASED PALPITATIONS. States that 3 times she had chest tightness, felt her "heart was beating out of her chest" and her watch said that her HR was 150. Non  exertional each time.  States would last less than a minute. She was sweaty the first time.  No caffeine, no new supplements other than changed brand of biotin and vitamin d. Never started adderall.  Had stress test Dr. Stanford Breed 2013 Echo 3614 diastolic HF Had history of OSA, states still snores, wakes up frequently with hot flashes, moves a lot in her sleep.    She is not on cholesterol medication and denies myalgias. Her cholesterol is at goal. The cholesterol last visit was:   Lab Results  Component Value Date   CHOL 160 12/08/2018   HDL 63 12/08/2018   LDLCALC 81 12/08/2018   TRIG 81 12/08/2018   CHOLHDL 2.5 12/08/2018    She has been working on diet and exercise for prediabetes, she has lost 100 + lbs s/p gastric bypass in 2000 and has a history of reactive hypoglycemia with secondary seizures, and denies hypoglycemia , paresthesia of the feet, polydipsia, polyuria and visual disturbances. Last A1C in the office was:  Lab Results  Component Value Date   HGBA1C 4.8 12/08/2018   Patient is on Vitamin D supplement, 50,000 5 days a week.  Lab Results  Component Value Date   VD25OH 76 12/08/2018     She has history of NASH via biopsy, follows with Dr. Deatra Ina. Lab Results  Component Value Date   ALT 30 (H) 12/08/2018   AST 28 12/08/2018   ALKPHOS 65 06/03/2017   BILITOT 0.5 12/08/2018   She has  chronic lower back pain, follows with Dr. Mina Marble. Sees Dr. Berenice Primas for her knees.   BMI is Body mass index is 31.15 kg/m., she is working on diet and exercise. Wt Readings from Last 8 Encounters:  06/14/19 166 lb 3.2 oz (75.4 kg)  12/08/18 163 lb (73.9 kg)  09/22/18 163 lb (73.9 kg)  06/23/18 160 lb 9.6 oz (72.8 kg)  04/11/18 154 lb (69.9 kg)  03/17/18 159 lb (72.1 kg)  01/28/18 157 lb (71.2 kg)  01/14/18 157 lb (71.2 kg)    Current Medications:  Current Outpatient Medications on File Prior to Visit  Medication Sig Dispense Refill  . Arginine 500 MG CAPS Take 500 mg by mouth  daily.    Marland Kitchen aspirin 81 MG tablet Take 81 mg by mouth daily.    Marland Kitchen b complex vitamins tablet Take 1 tablet by mouth daily.    . IRON, FERROUS SULFATE, PO Take 65 mg by mouth daily.    . milk thistle 175 MG tablet Take 175 mg by mouth daily.    . Multiple Vitamin (MULTIVITAMIN) tablet Take 1 tablet by mouth daily.     . NON FORMULARY Deep Blue Doterra Oil    . NON FORMULARY DDR, cellular complex    . NON FORMULARY Zendocrine    . NON FORMULARY Mitro 2 Max    . NON FORMULARY XEO Mega, omega complex    . NON FORMULARY Tri Ease Seasonal Blend    . NON FORMULARY Bone Nutrient    . NON FORMULARY Keratin    . NON FORMULARY Collagen Forensic psychologist    . polycarbophil (FIBERCON) 625 MG tablet Take 625 mg by mouth daily.    . TURMERIC PO Take 1,500 mg by mouth 2 (two) times daily.    . Vitamin D, Ergocalciferol, (DRISDOL) 50000 units CAPS capsule Take 1 capsule (50,000 Units total) by mouth daily. 90 capsule 1   No current facility-administered medications on file prior to visit.    Medical History:  Past Medical History:  Diagnosis Date  . ADD (attention deficit disorder) 07/19/2013  . Arthritis   . Cholecystitis, acute with cholelithiasis 07/19/2013  . Chronic left shoulder pain 07/19/2013  . Dyspnea   . GERD (gastroesophageal reflux disease) 07/19/2013  . Goiter   . HTN (hypertension)   . Hypoglycemia   . Hypoglycemia 07/19/2013  . Murmur   . Pre-diabetes   . Seizure disorder, primary generalized ([place]) 07/19/2013   May be related to hypoglycemia  . Seizures ([place]) last 01/08/18   2013-grand mal due to hypoglycemia  . Sleep apnea    "mild"-not using CPAP per pt   Allergies:  Allergies  Allergen Reactions  . Latex Rash     Review of Systems:  Review of Systems  Constitutional: Negative.   HENT: Negative.   Eyes: Negative.   Respiratory: Negative.   Cardiovascular: Positive for chest pain and palpitations.  Gastrointestinal: Negative.   Genitourinary: Negative.   Musculoskeletal:  Positive for back pain, joint pain and myalgias. Negative for falls and neck pain.  Skin: Negative.   Neurological: Negative.   Endo/Heme/Allergies: Negative.   Psychiatric/Behavioral: Negative for hallucinations, memory loss, substance abuse and suicidal ideas. The patient is not nervous/anxious and does not have insomnia.     Family history- Review and unchanged Social history- Review and unchanged Physical Exam: BP 120/66   Pulse 99   Temp 97.9 F (36.6 C)   Ht 5' 1.25" (1.556 m)   Wt 166 lb 3.2 oz (75.4  kg)   SpO2 98%   BMI 31.15 kg/m  Wt Readings from Last 3 Encounters:  06/14/19 166 lb 3.2 oz (75.4 kg)  12/08/18 163 lb (73.9 kg)  09/22/18 163 lb (73.9 kg)   General Appearance: Well nourished, in no apparent distress. Eyes: PERRLA, EOMs, conjunctiva no swelling or erythema Sinuses: No Frontal/maxillary tenderness ENT/Mouth: Ext aud canals clear, TMs without erythema, bulging. No erythema, swelling, or exudate on post pharynx.  Tonsils not swollen or erythematous. Hearing normal.  Neck: Supple, thyroid normal.  Respiratory: Respiratory effort normal, BS equal bilaterally without rales, rhonchi, wheezing or stridor.  Cardio: RRR with no MRGs. Brisk peripheral pulses without edema.  Abdomen: Soft, + BS,  Non tender, no guarding, rebound, hernias, masses. Lymphatics: Non tender without lymphadenopathy.  Musculoskeletal: Full ROM, 5/5 strength, Normal gait Skin: Warm, dry without rashes, lesions, ecchymosis.  Neuro: Cranial nerves intact. Normal muscle tone, no cerebellar symptoms. Psych: Awake and oriented X 3, normal affect, Insight and Judgment appropriate.   EKG: WNL, no ST changes  Vicie Mutters, PA-C 9:59 AM [facility]

## 2019-06-14 ENCOUNTER — Other Ambulatory Visit: Payer: Self-pay

## 2019-06-14 ENCOUNTER — Ambulatory Visit: Payer: Self-pay | Admitting: Adult Health

## 2019-06-14 ENCOUNTER — Ambulatory Visit (INDEPENDENT_AMBULATORY_CARE_PROVIDER_SITE_OTHER): Payer: BC Managed Care – PPO | Admitting: Physician Assistant

## 2019-06-14 ENCOUNTER — Encounter: Payer: Self-pay | Admitting: Physician Assistant

## 2019-06-14 VITALS — BP 120/66 | HR 99 | Temp 97.9°F | Ht 61.25 in | Wt 166.2 lb

## 2019-06-14 DIAGNOSIS — E782 Mixed hyperlipidemia: Secondary | ICD-10-CM | POA: Diagnosis not present

## 2019-06-14 DIAGNOSIS — D649 Anemia, unspecified: Secondary | ICD-10-CM

## 2019-06-14 DIAGNOSIS — I10 Essential (primary) hypertension: Secondary | ICD-10-CM

## 2019-06-14 DIAGNOSIS — R002 Palpitations: Secondary | ICD-10-CM | POA: Diagnosis not present

## 2019-06-14 DIAGNOSIS — R7309 Other abnormal glucose: Secondary | ICD-10-CM | POA: Diagnosis not present

## 2019-06-14 DIAGNOSIS — G40309 Generalized idiopathic epilepsy and epileptic syndromes, not intractable, without status epilepticus: Secondary | ICD-10-CM | POA: Diagnosis not present

## 2019-06-14 DIAGNOSIS — E559 Vitamin D deficiency, unspecified: Secondary | ICD-10-CM | POA: Diagnosis not present

## 2019-06-14 DIAGNOSIS — Z79899 Other long term (current) drug therapy: Secondary | ICD-10-CM | POA: Diagnosis not present

## 2019-06-14 NOTE — Patient Instructions (Signed)
Supraventricular Tachycardia, Adult Supraventricular tachycardia (SVT) is a type of abnormal heart rhythm. It causes the heart to beat very quickly and then return to a normal speed. A normal resting heart rate is 60-100 beats per minute. During an episode of SVT, your heart rate may be higher than 150 beats per minute. Episodes of SVT can be frightening, but they are usually not dangerous. However, if episodes happen several times per day or last longer than a few seconds, they may lead to heart failure. What are the causes?  Usually, a normal heartbeat starts when an area called the sinoatrial node releases an electrical signal. In SVT, other areas of the heart send out electrical signals that interfere with the signal from the sinoatrial node. It is not known why some people get SVT and others do not. What increases the risk? You are more likely to develop this condition if you are:  17-13 years old.  A woman. The following factors may make you more likely to develop this condition:  Stress.  Tiredness.  Smoking.  Stimulant drugs, such as cocaine and methamphetamine.  Alcohol.  Caffeine.  Pregnancy.  Anxiety. What are the signs or symptoms? Symptoms of this condition include:  A pounding heart.  A feeling that the heart is skipping beats (palpitations).  Weakness.  Shortness of breath.  Tightness or pain in your chest.  Light-headedness.  Anxiety.  Dizziness.  Sweating.  Nausea.  Fainting.  Fatigue or tiredness. A mild episode may not cause symptoms. How is this diagnosed? This condition may be diagnosed based on:  Your symptoms.  A physical exam. ? If you have an episode of SVT during the exam, the health care provider may be able to diagnose SVT by listening to your heart and feeling your pulse.  Tests. These may include: ? An electrocardiogram (ECG). This test is done to check for problems with electrical activity in the heart. ? A Holter  monitor or event monitor test. This test involves wearing a portable device that monitors your heart rate over time. ? An echocardiogram. This test involves taking an image of your heart using sound waves. It is done to rule out other causes of a fast heart rate. ? Blood tests. How is this treated? This condition may be treated with:  Vagal nerve stimulation. The treatment involves stimulating your vagus nerve, which slows down the heart. It is often the first and only treatment that is needed for this condition. Work with your health care provider to find which one works best for you. Ways to do this treatment include: ? Holding your breath and pushing, as though you are having a bowel movement. ? Massaging an area on one side of your neck, below your jaw. Do not try this yourself. Only a health care provider should do this. If done the wrong way, it can lead to a stroke. ? Bending forward with your head between your legs. ? Coughing while bending forward with your head between your legs. ? Closing your eyes and massaging your eyeballs. A health care provider should guide you through this method before you try it on your own.  Medicines that prevent attacks.  Medicine to stop an attack. The medicine is given through an IV at the hospital.  A small electric shock (cardioversion) that stops an attack. Before you get the shock, you will get medicine to make you fall asleep.  Radiofrequency ablation. In this procedure, a small, thin tube (catheter) is used to send radiofrequency  energy to the area of tissue that is causing the rapid heartbeats. The energy kills the cells and helps your heart keep a normal rhythm. You may have this treatment if you have symptoms of SVT often. If you do not have symptoms, you may not need treatment. Follow these instructions at home: Stress  Avoid stressful situations when possible.  Find healthy ways of managing stress that work for you. Some healthy ways to  manage stress include: ? Taking part in relaxing activities, such as yoga, meditation, or being out in nature. ? Listening to relaxing music. ? Practicing relaxation techniques, such as deep breathing. ? Leading a healthy lifestyle. This involves getting plenty of sleep, exercising, and eating a balanced diet. ? Attending counseling or talk therapy with a mental health professional. Lifestyle   Try to get at least 7 hours of sleep each night.  Do not use any products that contain nicotine or tobacco, such as cigarettes, e-cigarettes, and chewing tobacco. If you need help quitting, ask your health care provider.  Be aware of how alcohol affects your condition. If alcohol: ? Triggers episodes of SVT, do not drink alcohol. ? Does not seem to trigger episodes, limit alcohol intake to no more than 1 drink a day for nonpregnant women and 2 drinks a day for men. Be aware of how much alcohol is in your drink. In the [place] one drink equals one 12 oz bottle of beer (355 mL), one 5 oz glass of wine (148 mL), or one 1 oz glass of hard liquor (44 mL).  Be aware of how caffeine affects your condition. If caffeine: ? Triggers episodes of SVT, do not eat, drink, or use anything with caffeine in it. ? Does not seem to trigger episodes, consume caffeine in moderation.  Do not use stimulant drugs. If you need help quitting, talk with your health care provider. General instructions  Maintain a healthy weight.  Exercise regularly. Ask your health care provider to suggest some good activities for you. Aim for one or a combination of the following: ? 150 minutes per week of moderate exercise, such as walking or yoga. ? 75 minutes per week of vigorous exercise, such as running or swimming.  Perform vagus nerve stimulation as directed by your health care provider.  Take over-the-counter and prescription medicines only as told by your health care provider.  Keep all follow-up visits as told by your health  care provider. This is important. Contact a health care provider if:  You have episodes of SVT more often than before.  Episodes of SVT last longer than before.  Vagus nerve stimulation is no longer helping.  You have new symptoms. Get help right away if:  You have chest pain.  Your symptoms get worse.  You have trouble breathing.  You have an episode of SVT that lasts longer than 20 minutes.  You faint. These symptoms may represent a serious problem that is an emergency. Do not wait to see if the symptoms will go away. Get medical help right away. Call your local emergency services (911 in the [place] Do not drive yourself to the hospital. Summary  Supraventricular tachycardia (SVT) is a type of abnormal heart rhythm.  During an episode of SVT, your heart rate may be higher than 150 beats per minute.  Treatment depends on frequency of occurrence and symptoms experienced. This information is not intended to replace advice given to you by your health care provider. Make sure you discuss any questions you have  with your health care provider. Document Released: 11/30/2005 Document Revised: 10/18/2018 Document Reviewed: 10/18/2018 [employer] Patient Education  2020 [employer].

## 2019-06-15 LAB — CBC WITH DIFFERENTIAL/PLATELET
Absolute Monocytes: 551 cells/uL (ref 200–950)
Basophils Absolute: 12 cells/uL (ref 0–200)
Basophils Relative: 0.2 %
Eosinophils Absolute: 278 cells/uL (ref 15–500)
Eosinophils Relative: 4.8 %
HCT: 46.3 % — ABNORMAL HIGH (ref 35.0–45.0)
Hemoglobin: 15.9 g/dL — ABNORMAL HIGH (ref 11.7–15.5)
Lymphs Abs: 1305 cells/uL (ref 850–3900)
MCH: 33.3 pg — ABNORMAL HIGH (ref 27.0–33.0)
MCHC: 34.3 g/dL (ref 32.0–36.0)
MCV: 96.9 fL (ref 80.0–100.0)
MPV: 10.9 fL (ref 7.5–12.5)
Monocytes Relative: 9.5 %
Neutro Abs: 3654 cells/uL (ref 1500–7800)
Neutrophils Relative %: 63 %
Platelets: 206 10*3/uL (ref 140–400)
RBC: 4.78 10*6/uL (ref 3.80–5.10)
RDW: 11.7 % (ref 11.0–15.0)
Total Lymphocyte: 22.5 %
WBC: 5.8 10*3/uL (ref 3.8–10.8)

## 2019-06-15 LAB — COMPLETE METABOLIC PANEL WITH GFR
AG Ratio: 1.8 (calc) (ref 1.0–2.5)
ALT: 33 U/L — ABNORMAL HIGH (ref 6–29)
AST: 29 U/L (ref 10–35)
Albumin: 4.2 g/dL (ref 3.6–5.1)
Alkaline phosphatase (APISO): 64 U/L (ref 37–153)
BUN: 12 mg/dL (ref 7–25)
CO2: 30 mmol/L (ref 20–32)
Calcium: 9.4 mg/dL (ref 8.6–10.4)
Chloride: 105 mmol/L (ref 98–110)
Creat: 0.65 mg/dL (ref 0.50–1.05)
GFR, Est African American: 113 mL/min/{1.73_m2} (ref >=60)
GFR, Est Non African American: 98 mL/min/{1.73_m2} (ref >=60)
Globulin: 2.4 g/dL (calc) (ref 1.9–3.7)
Glucose, Bld: 117 mg/dL — ABNORMAL HIGH (ref 65–99)
Potassium: 4.2 mmol/L (ref 3.5–5.3)
Sodium: 142 mmol/L (ref 135–146)
Total Bilirubin: 0.6 mg/dL (ref 0.2–1.2)
Total Protein: 6.6 g/dL (ref 6.1–8.1)

## 2019-06-15 LAB — VITAMIN D 25 HYDROXY (VIT D DEFICIENCY, FRACTURES): Vit D, 25-Hydroxy: 119 ng/mL — ABNORMAL HIGH (ref 30–100)

## 2019-06-15 LAB — FERRITIN: Ferritin: 121 ng/mL (ref 16–232)

## 2019-06-15 LAB — IRON, TOTAL/TOTAL IRON BINDING CAP
%SAT: 45 % (calc) (ref 16–45)
Iron: 153 ug/dL (ref 45–160)
TIBC: 340 mcg/dL (calc) (ref 250–450)

## 2019-06-15 LAB — TSH: TSH: 2.19 mIU/L (ref 0.40–4.50)

## 2019-06-15 LAB — LIPID PANEL
Cholesterol: 183 mg/dL (ref <200)
HDL: 72 mg/dL (ref >=50)
LDL Cholesterol (Calc): 95 mg/dL (calc)
Non-HDL Cholesterol (Calc): 111 mg/dL (calc) (ref <130)
Total CHOL/HDL Ratio: 2.5 (calc) (ref <5.0)
Triglycerides: 75 mg/dL (ref <150)

## 2019-06-15 LAB — MAGNESIUM: Magnesium: 2.3 mg/dL (ref 1.5–2.5)

## 2019-06-15 LAB — VITAMIN B12: Vitamin B-12: 473 pg/mL (ref 200–1100)

## 2019-11-06 ENCOUNTER — Other Ambulatory Visit: Payer: Self-pay

## 2019-11-06 DIAGNOSIS — Z20822 Contact with and (suspected) exposure to covid-19: Secondary | ICD-10-CM

## 2019-11-07 LAB — NOVEL CORONAVIRUS, NAA: SARS-CoV-2, NAA: NOT DETECTED

## 2020-01-24 ENCOUNTER — Encounter: Payer: Self-pay | Admitting: Internal Medicine

## 2020-01-24 NOTE — Patient Instructions (Signed)
- Link to sign up at [place] Web site for the Covid[place]19 vaccine   [email or web address]   Or call [phone]   ++++++++++++++++++++++++++++++++++++  Vit D  & Vit C 1,000 mg   are recommended to help protect  against the Covid-19 and other Corona viruses.    Also it's recommended  to take  Zinc 50 mg  to help  protect against the Covid-19   and best place to get  is also on [employer].com  and don't pay more than 6-8 cents /pill !  ================================ Coronavirus (COVID-19) Are you at risk?  Are you at risk for the Coronavirus (COVID-19)?  To be considered HIGH RISK for Coronavirus (COVID-19), you have to meet the following criteria:  . Traveled to [place], [place], [place], [place] or [place]; or in the [place] to [place], [place], [place]  . or [place]; and have fever, cough, and shortness of breath within the last 2 weeks of travel OR . Been in close contact with a person diagnosed with COVID-19 within the last 2 weeks and have  . fever, cough,and shortness of breath .  . IF YOU DO NOT MEET THESE CRITERIA, YOU ARE CONSIDERED LOW RISK FOR COVID-19.  What to do if you are HIGH RISK for COVID-19?  . If you are having a medical emergency, call 911. . Seek medical care right away. Before you go to a doctor's office, urgent care or emergency department, .  call ahead and tell them about your recent travel, contact with someone diagnosed with COVID-19  .  and your symptoms.  . You should receive instructions from your physician's office regarding next steps of care.  . When you arrive at healthcare provider, tell the healthcare staff immediately you have returned from  . visiting [place], [place], [place], [place] or [place]; or traveled in the [place] to [place], [place],  . [place] or [place] in the last  two weeks or you have been in close contact with a person diagnosed with  . COVID-19 in the last 2 weeks.   . Tell the health care staff about your symptoms: fever, cough and shortness of breath. . After you have been seen by a medical provider, you will be either: o Tested for (COVID-19) and discharged home on quarantine except to seek medical care if  o symptoms worsen, and asked to  - Stay home and avoid contact with others until you get your results (4-5 days)  - Avoid travel on public transportation if possible (such as bus, train, or airplane) or o Sent to the Emergency Department by EMS for evaluation, COVID-19 testing  and  o possible admission depending on your condition and test results.  What to do if you are LOW RISK for COVID-19?  Reduce your risk of any infection by using the same precautions used for avoiding the common cold or flu:  . Wash your hands often with soap and warm water for at least 20 seconds.  If soap and water are not readily available,  . use an alcohol-based hand sanitizer with at least 60% alcohol.  . If coughing or sneezing, cover your mouth and nose by coughing or sneezing into the elbow areas of your shirt or coat, .  into a tissue or into your sleeve (not your hands). . Avoid shaking hands with others and consider head nods or verbal greetings only. . Avoid touching your eyes, nose, or mouth with unwashed   hands.  . Avoid close contact with people who are sick. . Avoid places or events with large numbers of people in one location, like concerts or sporting events. . Carefully consider travel plans you have or are making. . If you are planning any travel outside or inside the [place], visit the CDC's Travelers' Health webpage for the latest health notices. . If you have some symptoms but not all symptoms, continue to monitor at home and seek medical attention  . if your symptoms worsen. . If you are having a medical emergency, call  911. >>>>>>>>>>>>>>>>>>>>>>> Preventive Care for Adults  A healthy lifestyle and preventive care can promote health and wellness. Preventive health guidelines for women include the following key practices.  A routine yearly physical is a good way to check with your health care provider about your health and preventive screening. It is a chance to share any concerns and updates on your health and to receive a thorough exam.  Visit your dentist for a routine exam and preventive care every 6 months. Brush your teeth twice a day and floss once a day. Good oral hygiene prevents tooth decay and gum disease.  The frequency of eye exams is based on your age, health, family medical history, use of contact lenses, and other factors. Follow your health care provider's recommendations for frequency of eye exams.  Eat a healthy diet. Foods like vegetables, fruits, whole grains, low-fat dairy products, and lean protein foods contain the nutrients you need without too many calories. Decrease your intake of foods high in solid fats, added sugars, and salt. Eat the right amount of calories for you. Get information about a proper diet from your health care provider, if necessary.  Regular physical exercise is one of the most important things you can do for your health. Most adults should get at least 150 minutes of moderate-intensity exercise (any activity that increases your heart rate and causes you to sweat) each week. In addition, most adults need muscle-strengthening exercises on 2 or more days a week.  Maintain a healthy weight. The body mass index (BMI) is a screening tool to identify possible weight problems. It provides an estimate of body fat based on height and weight. Your health care provider can find your BMI and can help you achieve or maintain a healthy weight. For adults 20 years and older:  A BMI below 18.5 is considered underweight.  A BMI of 18.5 to 24.9 is normal.  A BMI of 25 to 29.9 is  considered overweight.  A BMI of 30 and above is considered obese.  Maintain normal blood lipids and cholesterol levels by exercising and minimizing your intake of saturated fat. Eat a balanced diet with plenty of fruit and vegetables. Blood tests for lipids and cholesterol should begin at age 20 and be repeated every 5 years. If your lipid or cholesterol levels are high, you are over 50, or you are at high risk for heart disease, you may need your cholesterol levels checked more frequently. Ongoing high lipid and cholesterol levels should be treated with medicines if diet and exercise are not working.  If you smoke, find out from your health care provider how to quit. If you do not use tobacco, do not start.  Lung cancer screening is recommended for adults aged 55-80 years who are at high risk for developing lung cancer because of a history of smoking. A yearly low-dose CT scan of the lungs is recommended for people who have at least   of smoking and are a current smoker or have quit within the past 15 years. A pack year of smoking is smoking an average of 1 pack of cigarettes a day for 1 year (for example: 1 pack a day for 30 years or 2 packs a day for 15 years). Yearly screening should continue until the smoker has stopped smoking for at least 15 years. Yearly screening should be stopped for people who develop a health problem that would prevent them from having lung cancer treatment.  High blood pressure causes heart disease and increases the risk of stroke. Your blood pressure should be checked at least every 1 to 2 years. Ongoing high blood pressure should be treated with medicines if weight loss and exercise do not work.  If you are 51-26 years old, ask your health care provider if you should take aspirin to prevent strokes.  Diabetes screening involves taking a blood sample to check your fasting blood sugar level. This should be done once every 3 years, after age 24, if you  are within normal weight and without risk factors for diabetes. Testing should be considered at a younger age or be carried out more frequently if you are overweight and have at least 1 risk factor for diabetes.  Breast cancer screening is essential preventive care for women. You should practice "breast self-awareness." This means understanding the normal appearance and feel of your breasts and may include breast self-examination. Any changes detected, no matter how small, should be reported to a health care provider. Women in their 3s and 30s should have a clinical breast exam (CBE) by a health care provider as part of a regular health exam every 1 to 3 years. After age 29, women should have a CBE every year. Starting at age 61, women should consider having a mammogram (breast X-ray test) every year. Women who have a family history of breast cancer should talk to their health care provider about genetic screening. Women at a high risk of breast cancer should talk to their health care providers about having an MRI and a mammogram every year.  Breast cancer gene (BRCA)-related cancer risk assessment is recommended for women who have family members with BRCA-related cancers. BRCA-related cancers include breast, ovarian, tubal, and peritoneal cancers. Having family members with these cancers may be associated with an increased risk for harmful changes (mutations) in the breast cancer genes BRCA1 and BRCA2. Results of the assessment will determine the need for genetic counseling and BRCA1 and BRCA2 testing.  Routine pelvic exams to screen for cancer are no longer recommended for nonpregnant women who are considered low risk for cancer of the pelvic organs (ovaries, uterus, and vagina) and who do not have symptoms. Ask your health care provider if a screening pelvic exam is right for you.  If you have had past treatment for cervical cancer or a condition that could lead to cancer, you need Pap tests and  screening for cancer for at least 20 years after your treatment. If Pap tests have been discontinued, your risk factors (such as having a new sexual partner) need to be reassessed to determine if screening should be resumed. Some women have medical problems that increase the chance of getting cervical cancer. In these cases, your health care provider may recommend more frequent screening and Pap tests.  Colorectal cancer can be detected and often prevented. Most routine colorectal cancer screening begins at the age of 69 years and continues through age 39 years. However, your health care  your health care provider may recommend screening at an earlier age if you have risk factors for colon cancer. On a yearly basis, your health care provider may provide home test kits to check for hidden blood in the stool. Use of a small camera at the end of a tube, to directly examine the colon (sigmoidoscopy or colonoscopy), can detect the earliest forms of colorectal cancer. Talk to your health care provider about this at age 50, when routine screening begins.  Direct exam of the colon should be repeated every 5-10 years through age 75 years, unless early forms of pre-cancerous polyps or small growths are found.  Hepatitis C blood testing is recommended for all people born from 1945 through 1965 and any individual with known risks for hepatitis C.  Pra  Osteoporosis is a disease in which the bones lose minerals and strength with aging. This can result in serious bone fractures or breaks. The risk of osteoporosis can be identified using a bone density scan. Women ages 65 years and over and women at risk for fractures or osteoporosis should discuss screening with their health care providers. Ask your health care provider whether you should take a calcium supplement or vitamin D to reduce the rate of osteoporosis.  Menopause can be associated with physical symptoms and risks. Hormone replacement therapy is available to decrease symptoms  and risks. You should talk to your health care provider about whether hormone replacement therapy is right for you.  Use sunscreen. Apply sunscreen liberally and repeatedly throughout the day. You should seek shade when your shadow is shorter than you. Protect yourself by wearing long sleeves, pants, a wide-brimmed hat, and sunglasses year round, whenever you are outdoors.  Once a month, do a whole body skin exam, using a mirror to look at the skin on your back. Tell your health care provider of new moles, moles that have irregular borders, moles that are larger than a pencil eraser, or moles that have changed in shape or color.  Stay current with required vaccines (immunizations).  Influenza vaccine. All adults should be immunized every year.  Tetanus, diphtheria, and acellular pertussis (Td, Tdap) vaccine. Pregnant women should receive 1 dose of Tdap vaccine during each pregnancy. The dose should be obtained regardless of the length of time since the last dose. Immunization is preferred during the 27th-36th week of gestation. An adult who has not previously received Tdap or who does not know her vaccine status should receive 1 dose of Tdap. This initial dose should be followed by tetanus and diphtheria toxoids (Td) booster doses every 10 years. Adults with an unknown or incomplete history of completing a 3-dose immunization series with Td-containing vaccines should begin or complete a primary immunization series including a Tdap dose. Adults should receive a Td booster every 10 years.  Varicella vaccine. An adult without evidence of immunity to varicella should receive 2 doses or a second dose if she has previously received 1 dose. Pregnant females who do not have evidence of immunity should receive the first dose after pregnancy. This first dose should be obtained before leaving the health care facility. The second dose should be obtained 4-8 weeks after the first dose.  Human papillomavirus (HPV)  vaccine. Females aged 13-26 years who have not received the vaccine previously should obtain the 3-dose series. The vaccine is not recommended for use in pregnant females. However, pregnancy testing is not needed before receiving a dose. If a female is found to be pregnant after receiving a   dose, no treatment is needed. In that case, the remaining doses should be delayed until after the pregnancy. Immunization is recommended for any person with an immunocompromised condition through the age of 26 years if she did not get any or all doses earlier. During the 3-dose series, the second dose should be obtained 4-8 weeks after the first dose. The third dose should be obtained 24 weeks after the first dose and 16 weeks after the second dose.  Zoster vaccine. One dose is recommended for adults aged 60 years or older unless certain conditions are present.  Measles, mumps, and rubella (MMR) vaccine. Adults born before 1957 generally are considered immune to measles and mumps. Adults born in 1957 or later should have 1 or more doses of MMR vaccine unless there is a contraindication to the vaccine or there is laboratory evidence of immunity to each of the three diseases. A routine second dose of MMR vaccine should be obtained at least 28 days after the first dose for students attending postsecondary schools, health care workers, or international travelers. People who received inactivated measles vaccine or an unknown type of measles vaccine during 1963-1967 should receive 2 doses of MMR vaccine. People who received inactivated mumps vaccine or an unknown type of mumps vaccine before 1979 and are at high risk for mumps infection should consider immunization with 2 doses of MMR vaccine. For females of childbearing age, rubella immunity should be determined. If there is no evidence of immunity, females who are not pregnant should be vaccinated. If there is no evidence of immunity, females who are pregnant should delay  immunization until after pregnancy. Unvaccinated health care workers born before 1957 who lack laboratory evidence of measles, mumps, or rubella immunity or laboratory confirmation of disease should consider measles and mumps immunization with 2 doses of MMR vaccine or rubella immunization with 1 dose of MMR vaccine.  Pneumococcal 13-valent conjugate (PCV13) vaccine. When indicated, a person who is uncertain of her immunization history and has no record of immunization should receive the PCV13 vaccine. An adult aged 19 years or older who has certain medical conditions and has not been previously immunized should receive 1 dose of PCV13 vaccine. This PCV13 should be followed with a dose of pneumococcal polysaccharide (PPSV23) vaccine. The PPSV23 vaccine dose should be obtained at least 1 or more year(s) after the dose of PCV13 vaccine. An adult aged 19 years or older who has certain medical conditions and previously received 1 or more doses of PPSV23 vaccine should receive 1 dose of PCV13. The PCV13 vaccine dose should be obtained 1 or more years after the last PPSV23 vaccine dose.    Pneumococcal polysaccharide (PPSV23) vaccine. When PCV13 is also indicated, PCV13 should be obtained first. All adults aged 65 years and older should be immunized. An adult younger than age 65 years who has certain medical conditions should be immunized. Any person who resides in a nursing home or long-term care facility should be immunized. An adult smoker should be immunized. People with an immunocompromised condition and certain other conditions should receive both PCV13 and PPSV23 vaccines. People with human immunodeficiency virus (HIV) infection should be immunized as soon as possible after diagnosis. Immunization during chemotherapy or radiation therapy should be avoided. Routine use of PPSV23 vaccine is not recommended for American Indians, [place] Natives, or people younger than 65 years unless there are medical conditions  that require PPSV23 vaccine. When indicated, people who have unknown immunization and have no record of immunization should receive   PPSV23 vaccine. One-time revaccination 5 years after the first dose of PPSV23 is recommended for people aged 19-64 years who have chronic kidney failure, nephrotic syndrome, asplenia, or immunocompromised conditions. People who received 1-2 doses of PPSV23 before age 65 years should receive another dose of PPSV23 vaccine at age 65 years or later if at least 5 years have passed since the previous dose. Doses of PPSV23 are not needed for people immunized with PPSV23 at or after age 65 years.  Preventive Services / Frequency   Ages 40 to 64 years  Blood pressure check.  Lipid and cholesterol check.  Lung cancer screening. / Every year if you are aged 55-80 years and have a 30-pack-year history of smoking and currently smoke or have quit within the past 15 years. Yearly screening is stopped once you have quit smoking for at least 15 years or develop a health problem that would prevent you from having lung cancer treatment.  Clinical breast exam.** / Every year after age 40 years.   BRCA-related cancer risk assessment.** / For women who have family members with a BRCA-related cancer (breast, ovarian, tubal, or peritoneal cancers).  Mammogram.** / Every year beginning at age 40 years and continuing for as long as you are in good health. Consult with your health care provider.  Pap test.** / Every 3 years starting at age 30 years through age 65 or 70 years with a history of 3 consecutive normal Pap tests.  HPV screening.** / Every 3 years from ages 30 years through ages 65 to 70 years with a history of 3 consecutive normal Pap tests.  Fecal occult blood test (FOBT) of stool. / Every year beginning at age 50 years and continuing until age 75 years. You may not need to do this test if you get a colonoscopy every 10 years.  Flexible sigmoidoscopy or colonoscopy.** / Every  5 years for a flexible sigmoidoscopy or every 10 years for a colonoscopy beginning at age 50 years and continuing until age 75 years.  Hepatitis C blood test.** / For all people born from 1945 through 1965 and any individual with known risks for hepatitis C.  Skin self-exam. / Monthly.  Influenza vaccine. / Every year.  Tetanus, diphtheria, and acellular pertussis (Tdap/Td) vaccine.** / Consult your health care provider. Pregnant women should receive 1 dose of Tdap vaccine during each pregnancy. 1 dose of Td every 10 years.  Varicella vaccine.** / Consult your health care provider. Pregnant females who do not have evidence of immunity should receive the first dose after pregnancy.  Zoster vaccine.** / 1 dose for adults aged 60 years or older.  Pneumococcal 13-valent conjugate (PCV13) vaccine.** / Consult your health care provider.  Pneumococcal polysaccharide (PPSV23) vaccine.** / 1 to 2 doses if you smoke cigarettes or if you have certain conditions.  Meningococcal vaccine.** / Consult your health care provider.  Hepatitis A vaccine.** / Consult your health care provider.  Hepatitis B vaccine.** / Consult your health care provider. Screening for abdominal aortic aneurysm (AAA)  by ultrasound is recommended for people over 50 who have history of high blood pressure or who are current or former smokers. ++++++++++++++++++ Recommend Adult Low Dose Aspirin or  coated  Aspirin 81 mg daily  To reduce risk of Colon Cancer 40 %,  Skin Cancer 26 % ,  Melanoma 46%  and  Pancreatic cancer 60% +++++++++++++++++++ Vitamin D goal  is between 70-100.  Please make sure that you are taking your Vitamin D as directed.    It is very important as a natural anti-inflammatory  helping hair, skin, and nails, as well as reducing stroke and heart attack risk.  It helps your bones and helps with mood. It also decreases numerous cancer risks so please take it as directed.  Low Vit D is associated with a  200-300% higher risk for CANCER  and 200-300% higher risk for HEART   ATTACK  &  STROKE.   ...................................... It is also associated with higher death rate at younger ages,  autoimmune diseases like Rheumatoid arthritis, Lupus, Multiple Sclerosis.    Also many other serious conditions, like depression, Alzheimer's Dementia, infertility, muscle aches, fatigue, fibromyalgia - just to name a few. ++++++++++++++++++ Recommend the book "The END of DIETING" by Dr Joel Fuhrman  & the book "The END of DIABETES " by Dr Joel Fuhrman At [employer].com - get book & Audio CD's    Being diabetic has a  300% increased risk for heart attack, stroke, cancer, and alzheimer- type vascular dementia. It is very important that you work harder with diet by avoiding all foods that are white. Avoid white rice (brown & wild rice is OK), white potatoes (sweetpotatoes in moderation is OK), White bread or wheat bread or anything made out of white flour like bagels, donuts, rolls, buns, biscuits, cakes, pastries, cookies, pizza crust, and pasta (made from white flour & egg whites) - vegetarian pasta or spinach or wheat pasta is OK. Multigrain breads like Arnold's or Pepper[employer], or multigrain sandwich thins or flatbreads.  Diet, exercise and weight loss can reverse and cure diabetes in the early stages.  Diet, exercise and weight loss is very important in the control and prevention of complications of diabetes which affects every system in your body, ie. Brain - dementia/stroke, eyes - glaucoma/blindness, heart - heart attack/heart failure, kidneys - dialysis, stomach - gastric paralysis, intestines - malabsorption, nerves - severe painful neuritis, circulation - gangrene & loss of a leg(s), and finally cancer and Alzheimers.    I recommend avoid fried & greasy foods,  sweets/candy, white rice (brown or wild rice or Quinoa is OK), white potatoes (sweet potatoes are OK) - anything made from white flour - bagels,  doughnuts, rolls, buns, biscuits,white and wheat breads, pizza crust and traditional pasta made of white flour & egg white(vegetarian pasta or spinach or wheat pasta is OK).  Multi-grain bread is OK - like multi-grain flat bread or sandwich thins. Avoid alcohol in excess. Exercise is also important.    Eat all the vegetables you want - avoid meat, especially red meat and dairy - especially cheese.  Cheese is the most concentrated form of trans-fats which is the worst thing to clog up our arteries. Veggie cheese is OK which can be found in the fresh produce section at [employer] or [employer] or [employer]  ++++++++++++++++++++++ DASH Eating Plan  DASH stands for "Dietary Approaches to Stop Hypertension."   The DASH eating plan is a healthy eating plan that has been shown to reduce high blood pressure (hypertension). Additional health benefits may include reducing the risk of type 2 diabetes mellitus, heart disease, and stroke. The DASH eating plan may also help with weight loss. WHAT DO I NEED TO KNOW ABOUT THE DASH EATING PLAN? For the DASH eating plan, you will follow these general guidelines:  Choose foods with a percent daily value for sodium of less than 5% (as listed on the food label).  Use salt-free seasonings or herbs instead of table salt or sea   salt.  Check with your health care provider or pharmacist before using salt substitutes.  Eat lower-sodium products, often labeled as "lower sodium" or "no salt added."  Eat fresh foods.  Eat more vegetables, fruits, and low-fat dairy products.  Choose whole grains. Look for the word "whole" as the first word in the ingredient list.  Choose fish   Limit sweets, desserts, sugars, and sugary drinks.  Choose heart-healthy fats.  Eat veggie cheese   Eat more home-cooked food and less restaurant, buffet, and fast food.  Limit fried foods.  Cook foods using methods other than frying.  Limit canned vegetables. If you do use  them, rinse them well to decrease the sodium.  When eating at a restaurant, ask that your food be prepared with less salt, or no salt if possible.                      WHAT FOODS CAN I EAT? Read Dr Joel Fuhrman's books on The End of Dieting & The End of Diabetes  Grains Whole grain or whole wheat bread. Brown rice. Whole grain or whole wheat pasta. Quinoa, bulgur, and whole grain cereals. Low-sodium cereals. Corn or whole wheat flour tortillas. Whole grain cornbread. Whole grain crackers. Low-sodium crackers.  Vegetables Fresh or frozen vegetables (raw, steamed, roasted, or grilled). Low-sodium or reduced-sodium tomato and vegetable juices. Low-sodium or reduced-sodium tomato sauce and paste. Low-sodium or reduced-sodium canned vegetables.   Fruits All fresh, canned (in natural juice), or frozen fruits.  Protein Products  All fish and seafood.  Dried beans, peas, or lentils. Unsalted nuts and seeds. Unsalted canned beans.  Dairy Low-fat dairy products, such as skim or 1% milk, 2% or reduced-fat cheeses, low-fat ricotta or cottage cheese, or plain low-fat yogurt. Low-sodium or reduced-sodium cheeses.  Fats and Oils Tub margarines without trans fats. Light or reduced-fat mayonnaise and salad dressings (reduced sodium). Avocado. Safflower, olive, or canola oils. Natural peanut or almond butter.  Other Unsalted popcorn and pretzels. The items listed above may not be a complete list of recommended foods or beverages. Contact your dietitian for more options.  ++++++++++++++++++  WHAT FOODS ARE NOT RECOMMENDED? Grains/ White flour or wheat flour White bread. White pasta. White rice. Refined cornbread. Bagels and croissants. Crackers that contain trans fat.  Vegetables  Creamed or fried vegetables. Vegetables in a . Regular canned vegetables. Regular canned tomato sauce and paste. Regular tomato and vegetable juices.  Fruits Dried fruits. Canned fruit in light or heavy syrup. Fruit  juice.  Meat and Other Protein Products Meat in general - RED meat & White meat.  Fatty cuts of meat. Ribs, chicken wings, all processed meats as bacon, sausage, bologna, salami, fatback, hot dogs, bratwurst and packaged luncheon meats.  Dairy Whole or 2% milk, cream, half-and-half, and cream cheese. Whole-fat or sweetened yogurt. Full-fat cheeses or blue cheese. Non-dairy creamers and whipped toppings. Processed cheese, cheese spreads, or cheese curds.  Condiments Onion and garlic salt, seasoned salt, table salt, and sea salt. Canned and packaged gravies. Worcestershire sauce. Tartar sauce. Barbecue sauce. Teriyaki sauce. Soy sauce, including reduced sodium. Steak sauce. Fish sauce. Oyster sauce. Cocktail sauce. Horseradish. Ketchup and mustard. Meat flavorings and tenderizers. Bouillon cubes. Hot sauce. Tabasco sauce. Marinades. Taco seasonings. Relishes.  Fats and Oils Butter, stick margarine, lard, shortening and bacon fat. Coconut, palm kernel, or palm oils. Regular salad dressings.  Pickles and olives. Salted popcorn and pretzels.  The items listed above may not be a complete   list of foods and beverages to avoid.    

## 2020-01-24 NOTE — Progress Notes (Signed)
Annual Screening/Preventative Visit & Comprehensive Evaluation &  Examination     This very nice 59 y.o. MWF  presents for a Screening /Preventative Visit & comprehensive evaluation and management of multiple medical co-morbidities.  Patient has been followed expectantly for labile  HTN, HLD, Prediabetes  and Vitamin D Deficiency.      Labile HTN predates since 2000. Patient's BP has been controlled at home and patient denies any cardiac symptoms as chest pain, palpitations, shortness of breath, dizziness or ankle swelling. Today's BP is at goal - 126/80.      Patient's hyperlipidemia is controlled with diet. Last lipids were at goal:  Lab Results  Component Value Date   CHOL 183 06/14/2019   HDL 72 06/14/2019   LDLCALC 95 06/14/2019   TRIG 75 06/14/2019   CHOLHDL 2.5 06/14/2019       Patient has hx/o Gastric Bipass in 2000  And has lost 100# since that time. At one time she had episodes of hypoglycemic induced seizures.  Patientatient denies recent reactive hypoglycemic symptoms, visual blurring, diabetic polys or paresthesias. Last A1c was Normal & at goal:  Lab Results  Component Value Date   HGBA1C 4.8 12/08/2018       Finally, patient has history of Vitamin D Deficiency ("27" / 2009) and last Vitamin D was sl elevated and dose was tapered:  Lab Results  Component Value Date   VD25OH 119 (H) 06/14/2019    Current Outpatient Medications on File Prior to Visit  Medication Sig  . Arginine 500 MG CAPS Take 500 mg by mouth daily.  Marland Kitchen aspirin 81 MG tablet Take 81 mg by mouth daily.  Marland Kitchen b complex vitamins tablet Take 1 tablet by mouth daily.  . IRON, FERROUS SULFATE, PO Take 65 mg by mouth daily.  . milk thistle 175 MG tablet Take 175 mg by mouth daily.  . Multiple Vitamin (MULTIVITAMIN) tablet Take 1 tablet by mouth daily.   . NON FORMULARY Deep Blue Doterra Oil  . NON FORMULARY DDR, cellular complex  . NON FORMULARY Zendocrine  . NON FORMULARY Mitro 2 Max  . NON FORMULARY  XEO Mega, omega complex  . NON FORMULARY Tri Ease Seasonal Blend  . NON FORMULARY Bone Nutrient  . NON FORMULARY Keratin  . NON FORMULARY Collagen Forensic psychologist  . polycarbophil (FIBERCON) 625 MG tablet Take 625 mg by mouth daily.  . Probiotic CAPS Take 1 capsule by mouth daily.  . TURMERIC PO Take 1,500 mg by mouth 2 (two) times daily.  . Vitamin D, Ergocalciferol, (DRISDOL) 50000 units CAPS capsule Take 1 capsule (50,000 Units total) by mouth daily.   No current facility-administered medications on file prior to visit.   Allergies  Allergen Reactions  . Latex Rash   Past Medical History:  Diagnosis Date  . ADD (attention deficit disorder) 07/19/2013  . Arthritis   . Cholecystitis, acute with cholelithiasis 07/19/2013  . Chronic left shoulder pain 07/19/2013  . Dyspnea   . GERD (gastroesophageal reflux disease) 07/19/2013  . Goiter   . HTN (hypertension)   . Hypoglycemia   . Hypoglycemia 07/19/2013  . Murmur   . Pre-diabetes   . Seizure disorder, primary generalized ([place]) 07/19/2013   May be related to hypoglycemia  . Seizures ([place]) last 01/08/18   2013-grand mal due to hypoglycemia  . Sleep apnea    "mild"-not using CPAP per pt   Health Maintenance  Topic Date Due  . PAP SMEAR-Modifier  10/02/1981  . MAMMOGRAM  10/16/2016  .  COLONOSCOPY  01/29/2028  . TETANUS/TDAP  01/24/2030  . INFLUENZA VACCINE  Completed  . Hepatitis C Screening  Completed  . HIV Screening  Completed   Immunization History  Administered Date(s) Administered  . Influenza,inj,quad, With Preservative 10/04/2019  . Influenza-Unspecified 09/15/2016, 09/13/2017, 09/14/2018, 10/04/2019  . PPD Test 11/30/2016, 12/08/2018, 01/25/2020  . Pneumococcal Polysaccharide-23 12/14/1998  . Td 01/25/2020  . Tdap 11/06/2009    Last Colon - 01/28/2018 - Dr Loletha Carrow - recc 10 yr f/u due Feb 2025  Last MGM - 10/16/2014  Past Surgical History:  Procedure Laterality Date  . ABDOMINAL HYSTERECTOMY    . BREAST ENHANCEMENT  SURGERY    . CHOLECYSTECTOMY N/A 07/20/2013   Procedure: LAPAROSCOPIC CHOLECYSTECTOMY WITH INTRAOPERATIVE CHOLANGIOGRAM;  Surgeon: Shann Medal, MD;  Location: [facility];  Service: General;  Laterality: N/A;  . GASTRIC BYPASS    . GASTRIC BYPASS    . GASTRIC BYPASS  2000  . leg lift  2008   had aspiration in lungs, fluid was "drained" per pt/this was done at outpatient center/RM  . Stomach tuck    . TOTAL SHOULDER REPLACEMENT     Family History  Problem Relation Age of Onset  . Coronary artery disease Father        MI at age 30  . Heart disease Father   . Hypertension Father   . Diabetes Father   . Skin cancer Mother   . Hypertension Mother   . Hyperlipidemia Mother   . Breast cancer Maternal Aunt   . Cancer Maternal Aunt   . Breast cancer Paternal Aunt   . Breast cancer Paternal Uncle   . Cancer Cousin   . Colon cancer Neg Hx    Social History   Tobacco Use  . Smoking status: Never Smoker  . Smokeless tobacco: Never Used  Substance Use Topics  . Alcohol use: Yes    Alcohol/week: 3.0 standard drinks    Types: 1 Standard drinks or equivalent, 2 Glasses of wine per week    Comment: Occasional/social   . Drug use: No    ROS Constitutional: Denies fever, chills, weight loss/gain, headaches, insomnia,  night sweats, and change in appetite. Does c/o fatigue. Eyes: Denies redness, blurred vision, diplopia, discharge, itchy, watery eyes.  ENT: Denies discharge, congestion, post nasal drip, epistaxis, sore throat, earache, hearing loss, dental pain, Tinnitus, Vertigo, Sinus pain, snoring.  Cardio: Denies chest pain, palpitations, irregular heartbeat, syncope, dyspnea, diaphoresis, orthopnea, PND, claudication, edema Respiratory: denies cough, dyspnea, DOE, pleurisy, hoarseness, laryngitis, wheezing.  Gastrointestinal: Denies dysphagia, heartburn, reflux, water brash, pain, cramps, nausea, vomiting, bloating, diarrhea, constipation, hematemesis, melena, hematochezia, jaundice,  hemorrhoids Genitourinary: Denies dysuria, frequency, urgency, nocturia, hesitancy, discharge, hematuria, flank pain Breast: Breast lumps, nipple discharge, bleeding.  Musculoskeletal: Denies arthralgia, myalgia, stiffness, Jt. Swelling, pain, limp, and strain/sprain. Denies falls. Skin: Denies puritis, rash, hives, warts, acne, eczema, changing in skin lesion Neuro: No weakness, tremor, incoordination, spasms, paresthesia, pain Psychiatric: Denies confusion, memory loss, sensory loss. Denies Depression. Endocrine: Denies change in weight, skin, hair change, nocturia, and paresthesia, diabetic polys, visual blurring, hyper / hypo glycemic episodes.  Heme/Lymph: No excessive bleeding, bruising, enlarged lymph nodes.  Physical Exam  BP 126/80   Pulse 76   Temp (!) 97 F (36.1 C)   Resp 16   Ht 5' 1.25" (1.556 m)   Wt 171 lb 3.2 oz (77.7 kg)   BMI 32.08 kg/m   General Appearance: Over nourished, well groomed and in no apparent distress.  Eyes:  PERRLA, EOMs, conjunctiva no swelling or erythema, normal fundi and vessels. Sinuses: No frontal/maxillary tenderness ENT/Mouth: EACs patent / TMs  nl. Nares clear without erythema, swelling, mucoid exudates. Oral hygiene is good. No erythema, swelling, or exudate. Tongue normal, non-obstructing. Tonsils not swollen or erythematous. Hearing normal.  Neck: Supple, thyroid not palpable. No bruits, nodes or JVD. Respiratory: Respiratory effort normal.  BS equal and clear bilateral without rales, rhonci, wheezing or stridor. Cardio: Heart sounds are normal with regular rate and rhythm and no murmurs, rubs or gallops. Peripheral pulses are normal and equal bilaterally without edema. No aortic or femoral bruits. Chest: symmetric with normal excursions and percussion. Breasts: Symmetric, without lumps, nipple discharge, retractions, or fibrocystic changes.  Abdomen: Flat, soft with bowel sounds active. Nontender, no guarding, rebound, hernias, masses, or  organomegaly.  Lymphatics: Non tender without lymphadenopathy.  Genitourinary:  Musculoskeletal: Full ROM all peripheral extremities, joint stability, 5/5 strength, and normal gait. Skin: Warm and dry without rashes, lesions, cyanosis, clubbing or  ecchymosis.  Neuro: Cranial nerves intact, reflexes equal bilaterally. Normal muscle tone, no cerebellar symptoms. Sensation intact.  Pysch: Alert and oriented X 3, normal affect, Insight and Judgment appropriate.   Assessment and Plan  1. Annual Preventative Screening Examination  2. Labile hypertension  - EKG 12-Lead - [place], RETROPERITNL ABD,  LTD - Urinalysis, Routine w reflex microscopic - Microalbumin / creatinine urine ratio - CBC with Differential/Platelet - COMPLETE METABOLIC PANEL WITH GFR - Magnesium - TSH  3. Hyperlipidemia, mixed  - EKG 12-Lead - [place], RETROPERITNL ABD,  LTD - Lipid panel - TSH  4. Abnormal glucose  - EKG 12-Lead - [place], RETROPERITNL ABD,  LTD - Hemoglobin A1c - Insulin, random  5. Vitamin D deficiency  - VITAMIN D 25 Hydroxy (Vit-D Deficiency, Fractures)  6. Attention deficit hyperactivity disorder (ADHD), predominantly inattentive type   7. Obesity (BMI 30.0-34.9)  - phentermine 37.5 MG tab; Take 1/2-1 tablet every Morning   Disp: 90 tab; Rf: 1  - topiramate 50 MG tab; Take 1/2-1 tablet 2 x /day Disp: 180 tab; Rf: 1  8. Screening for colorectal cancer  - POC Hemoccult Bld/Stl  9. Screening examination for pulmonary tuberculosis  - TB Skin Test  10. Screening for ischemic heart disease  - EKG 12-Lead  11. FHx: heart disease  - EKG 12-Lead - [place], RETROPERITNL ABD,  LTD  12. Screening for AAA (aortic abdominal aneurysm)  - [place], RETROPERITNL ABD,  LTD  13. Fatigue  - Iron,Total/Total Iron Binding Cap - Vitamin B12 - CBC with Differential/Platelet - TSH  14. Need for prophylactic vaccination with tetanus-diphtheria (Td)   15. Medication management  - Urinalysis, Routine w  reflex microscopic - Microalbumin / creatinine urine ratio - CBC with Differential/Platelet - COMPLETE METABOLIC PANEL WITH GFR - Magnesium - Lipid panel - TSH - Hemoglobin A1c - Insulin, random - VITAMIN D 25 Hydroxy         Patient was counseled in prudent diet to achieve/maintain BMI less than 25 for weight control, BP monitoring, regular exercise and medications. Discussed med's effects and SE's. Screening labs and tests as requested with regular follow-up as recommended. Over 40 minutes of exam, counseling, chart review and high complex critical decision making was performed.   [name], MD  This note is not being shared with the patient for the following reason: To prevent harm (release of this note would result in harm to the life or physical safety of the patient or another).

## 2020-01-25 ENCOUNTER — Ambulatory Visit (INDEPENDENT_AMBULATORY_CARE_PROVIDER_SITE_OTHER): Payer: BC Managed Care – PPO | Admitting: Internal Medicine

## 2020-01-25 ENCOUNTER — Other Ambulatory Visit: Payer: Self-pay

## 2020-01-25 VITALS — BP 126/80 | HR 76 | Temp 97.0°F | Resp 16 | Ht 61.25 in | Wt 171.2 lb

## 2020-01-25 DIAGNOSIS — E559 Vitamin D deficiency, unspecified: Secondary | ICD-10-CM

## 2020-01-25 DIAGNOSIS — E782 Mixed hyperlipidemia: Secondary | ICD-10-CM

## 2020-01-25 DIAGNOSIS — Z0000 Encounter for general adult medical examination without abnormal findings: Secondary | ICD-10-CM

## 2020-01-25 DIAGNOSIS — Z79899 Other long term (current) drug therapy: Secondary | ICD-10-CM | POA: Diagnosis not present

## 2020-01-25 DIAGNOSIS — Z111 Encounter for screening for respiratory tuberculosis: Secondary | ICD-10-CM

## 2020-01-25 DIAGNOSIS — F900 Attention-deficit hyperactivity disorder, predominantly inattentive type: Secondary | ICD-10-CM

## 2020-01-25 DIAGNOSIS — Z8249 Family history of ischemic heart disease and other diseases of the circulatory system: Secondary | ICD-10-CM | POA: Diagnosis not present

## 2020-01-25 DIAGNOSIS — Z1389 Encounter for screening for other disorder: Secondary | ICD-10-CM

## 2020-01-25 DIAGNOSIS — Z130 Encounter for screening for diseases of the blood and blood-forming organs and certain disorders involving the immune mechanism: Secondary | ICD-10-CM | POA: Diagnosis not present

## 2020-01-25 DIAGNOSIS — Z23 Encounter for immunization: Secondary | ICD-10-CM

## 2020-01-25 DIAGNOSIS — R0989 Other specified symptoms and signs involving the circulatory and respiratory systems: Secondary | ICD-10-CM

## 2020-01-25 DIAGNOSIS — R5383 Other fatigue: Secondary | ICD-10-CM

## 2020-01-25 DIAGNOSIS — Z131 Encounter for screening for diabetes mellitus: Secondary | ICD-10-CM

## 2020-01-25 DIAGNOSIS — Z1211 Encounter for screening for malignant neoplasm of colon: Secondary | ICD-10-CM

## 2020-01-25 DIAGNOSIS — E66811 Obesity, class 1: Secondary | ICD-10-CM

## 2020-01-25 DIAGNOSIS — Z13220 Encounter for screening for lipoid disorders: Secondary | ICD-10-CM

## 2020-01-25 DIAGNOSIS — Z1329 Encounter for screening for other suspected endocrine disorder: Secondary | ICD-10-CM

## 2020-01-25 DIAGNOSIS — E669 Obesity, unspecified: Secondary | ICD-10-CM

## 2020-01-25 DIAGNOSIS — Z136 Encounter for screening for cardiovascular disorders: Secondary | ICD-10-CM | POA: Diagnosis not present

## 2020-01-25 DIAGNOSIS — Z0001 Encounter for general adult medical examination with abnormal findings: Secondary | ICD-10-CM

## 2020-01-25 DIAGNOSIS — R7309 Other abnormal glucose: Secondary | ICD-10-CM

## 2020-01-26 LAB — CBC WITH DIFFERENTIAL/PLATELET
Absolute Monocytes: 694 cells/uL (ref 200–950)
Basophils Absolute: 20 cells/uL (ref 0–200)
Basophils Relative: 0.3 %
Eosinophils Absolute: 313 cells/uL (ref 15–500)
Eosinophils Relative: 4.6 %
HCT: 47.5 % — ABNORMAL HIGH (ref 35.0–45.0)
Hemoglobin: 16.7 g/dL — ABNORMAL HIGH (ref 11.7–15.5)
Lymphs Abs: 1877 cells/uL (ref 850–3900)
MCH: 34.6 pg — ABNORMAL HIGH (ref 27.0–33.0)
MCHC: 35.2 g/dL (ref 32.0–36.0)
MCV: 98.5 fL (ref 80.0–100.0)
MPV: 11.5 fL (ref 7.5–12.5)
Monocytes Relative: 10.2 %
Neutro Abs: 3896 cells/uL (ref 1500–7800)
Neutrophils Relative %: 57.3 %
Platelets: 208 10*3/uL (ref 140–400)
RBC: 4.82 10*6/uL (ref 3.80–5.10)
RDW: 11.9 % (ref 11.0–15.0)
Total Lymphocyte: 27.6 %
WBC: 6.8 10*3/uL (ref 3.8–10.8)

## 2020-01-26 LAB — COMPLETE METABOLIC PANEL WITH GFR
AG Ratio: 1.7 (calc) (ref 1.0–2.5)
ALT: 46 U/L — ABNORMAL HIGH (ref 6–29)
AST: 38 U/L — ABNORMAL HIGH (ref 10–35)
Albumin: 4.3 g/dL (ref 3.6–5.1)
Alkaline phosphatase (APISO): 79 U/L (ref 37–153)
BUN: 12 mg/dL (ref 7–25)
CO2: 29 mmol/L (ref 20–32)
Calcium: 9.5 mg/dL (ref 8.6–10.4)
Chloride: 106 mmol/L (ref 98–110)
Creat: 0.71 mg/dL (ref 0.50–1.05)
GFR, Est African American: 108 mL/min/{1.73_m2} (ref >=60)
GFR, Est Non African American: 93 mL/min/{1.73_m2} (ref >=60)
Globulin: 2.5 g/dL (calc) (ref 1.9–3.7)
Glucose, Bld: 107 mg/dL — ABNORMAL HIGH (ref 65–99)
Potassium: 4.5 mmol/L (ref 3.5–5.3)
Sodium: 141 mmol/L (ref 135–146)
Total Bilirubin: 0.6 mg/dL (ref 0.2–1.2)
Total Protein: 6.8 g/dL (ref 6.1–8.1)

## 2020-01-26 LAB — LIPID PANEL
Cholesterol: 179 mg/dL (ref <200)
HDL: 69 mg/dL (ref >=50)
LDL Cholesterol (Calc): 92 mg/dL (calc)
Non-HDL Cholesterol (Calc): 110 mg/dL (calc) (ref <130)
Total CHOL/HDL Ratio: 2.6 (calc) (ref <5.0)
Triglycerides: 87 mg/dL (ref <150)

## 2020-01-26 LAB — URINALYSIS, ROUTINE W REFLEX MICROSCOPIC
Bilirubin Urine: NEGATIVE
Hgb urine dipstick: NEGATIVE
Leukocytes,Ua: NEGATIVE
Nitrite: NEGATIVE
Protein, ur: NEGATIVE
Specific Gravity, Urine: 1.028 (ref 1.001–1.03)
pH: 5.5 (ref 5.0–8.0)

## 2020-01-26 LAB — MICROALBUMIN / CREATININE URINE RATIO
Creatinine, Urine: 144 mg/dL (ref 20–275)
Microalb Creat Ratio: 3 mcg/mg creat (ref <30)
Microalb, Ur: 0.5 mg/dL

## 2020-01-26 LAB — TSH: TSH: 1.96 mIU/L (ref 0.40–4.50)

## 2020-01-26 LAB — VITAMIN B12: Vitamin B-12: 445 pg/mL (ref 200–1100)

## 2020-01-26 LAB — HEMOGLOBIN A1C
Hgb A1c MFr Bld: 4.9 % of total Hgb (ref <5.7)
Mean Plasma Glucose: 94 (calc)
eAG (mmol/L): 5.2 (calc)

## 2020-01-26 LAB — IRON, TOTAL/TOTAL IRON BINDING CAP
%SAT: 38 % (calc) (ref 16–45)
Iron: 141 ug/dL (ref 45–160)
TIBC: 369 mcg/dL (calc) (ref 250–450)

## 2020-01-26 LAB — MAGNESIUM: Magnesium: 2.4 mg/dL (ref 1.5–2.5)

## 2020-01-26 LAB — INSULIN, RANDOM: Insulin: 10.8 u[IU]/mL

## 2020-01-26 LAB — VITAMIN D 25 HYDROXY (VIT D DEFICIENCY, FRACTURES): Vit D, 25-Hydroxy: 100 ng/mL (ref 30–100)

## 2020-01-26 MED ORDER — TOPIRAMATE 50 MG PO TABS: ORAL_TABLET | ORAL | 1 refills | Status: DC

## 2020-01-26 MED ORDER — PHENTERMINE HCL 37.5 MG PO TABS: ORAL_TABLET | ORAL | 1 refills | Status: DC

## 2020-02-19 ENCOUNTER — Other Ambulatory Visit: Payer: Self-pay

## 2020-02-19 DIAGNOSIS — Z1211 Encounter for screening for malignant neoplasm of colon: Secondary | ICD-10-CM | POA: Diagnosis not present

## 2020-02-19 DIAGNOSIS — Z1212 Encounter for screening for malignant neoplasm of rectum: Secondary | ICD-10-CM | POA: Diagnosis not present

## 2020-02-19 LAB — POC HEMOCCULT BLD/STL (HOME/3-CARD/SCREEN)
Card #2 Fecal Occult Blod, POC: NEGATIVE
Card #3 Fecal Occult Blood, POC: NEGATIVE
Fecal Occult Blood, POC: NEGATIVE

## 2020-03-10 ENCOUNTER — Encounter: Payer: Self-pay | Admitting: Family Medicine

## 2020-03-10 ENCOUNTER — Ambulatory Visit
Admission: EM | Admit: 2020-03-10 | Discharge: 2020-03-10 | Disposition: A | Discharge status: Home / Self Care | Payer: BC Managed Care – PPO | Attending: Family Medicine | Admitting: Family Medicine

## 2020-03-10 ENCOUNTER — Other Ambulatory Visit: Payer: Self-pay

## 2020-03-10 DIAGNOSIS — R05 Cough: Secondary | ICD-10-CM

## 2020-03-10 DIAGNOSIS — J9801 Acute bronchospasm: Secondary | ICD-10-CM

## 2020-03-10 DIAGNOSIS — R059 Cough, unspecified: Secondary | ICD-10-CM

## 2020-03-10 DIAGNOSIS — Z20822 Contact with and (suspected) exposure to covid-19: Secondary | ICD-10-CM

## 2020-03-10 MED ORDER — HYDROCODONE-HOMATROPINE 5-1.5 MG/5ML PO SYRP: 5.0000 mL | ORAL_SOLUTION | Freq: Four times a day (QID) | ORAL | 0 refills | Status: DC | PRN

## 2020-03-10 MED ORDER — ALBUTEROL SULFATE HFA 108 (90 BASE) MCG/ACT IN AERS: 1.0000 | INHALATION_SPRAY | Freq: Four times a day (QID) | RESPIRATORY_TRACT | 1 refills | Status: DC | PRN

## 2020-03-10 NOTE — ED Triage Notes (Signed)
Pt presents to [facility] for assessment after daughter and son-in-law tested positive for COVID.  Pt c/o cough and fever starting 5 days ago.  Fevers of 102, come down with Ibuprofen.  C/o body aches, shortness of breath with exertion, back pain, headaches.  Denies n/v/d, denies abdominal pain.

## 2020-03-10 NOTE — ED Provider Notes (Signed)
[name]    [name]: [id number] Arrival date & time: 03/10/20  1432      History   Chief Complaint Chief Complaint  Patient presents with  . Exposure to COVID    HPI Emily Graham is a 59 y.o. female.   59 yo woman making first [facility] visit.  She is interested in Covid screening.  Pt presents to [facility] for assessment after daughter and son-in-law tested positive for COVID.  Pt c/o cough and fever starting 5 days ago.  Fevers of 102, come down with Ibuprofen.  C/o body aches, shortness of breath with exertion, back pain, headaches.  Denies n/v/d, denies abdominal pain.    Has used inhaler in the past     Past Medical History:  Diagnosis Date  . ADD (attention deficit disorder) 07/19/2013  . Arthritis   . Cholecystitis, acute with cholelithiasis 07/19/2013  . Chronic left shoulder pain 07/19/2013  . Dyspnea   . GERD (gastroesophageal reflux disease) 07/19/2013  . Goiter   . HTN (hypertension)   . Hypoglycemia   . Hypoglycemia 07/19/2013  . Murmur   . Pre-diabetes   . Seizure disorder, primary generalized ([place]) 07/19/2013   May be related to hypoglycemia  . Seizures ([place]) last 01/08/18   2013-grand mal due to hypoglycemia  . Sleep apnea    "mild"-not using CPAP per pt    Patient Active Problem List   Diagnosis Date Noted  . Overweight (BMI 25.0-29.9) 03/16/2018  . NASH (nonalcoholic steatohepatitis) 05/06/2015  . Liver fibrosis   . Rectal bleeding 01/29/2015  . History of colonic polyps 01/29/2015  . Abnormal liver function tests 01/29/2015  . Vitamin D deficiency 11/27/2014  . Medication management 11/27/2014  . Other abnormal glucose 11/27/2013  . Seizure disorder, primary generalized ([name]) 07/19/2013  . ADD (attention deficit disorder) 07/19/2013  . GERD (gastroesophageal reflux disease) 07/19/2013  . Chronic left shoulder pain 07/19/2013  . HTN (hypertension)   . Hyperlipidemia     Past Surgical History:  Procedure Laterality Date  . ABDOMINAL  HYSTERECTOMY    . BREAST ENHANCEMENT SURGERY    . CHOLECYSTECTOMY N/A 07/20/2013   Procedure: LAPAROSCOPIC CHOLECYSTECTOMY WITH INTRAOPERATIVE CHOLANGIOGRAM;  Surgeon: Shann Medal, MD;  Location: [facility];  Service: General;  Laterality: N/A;  . GASTRIC BYPASS    . GASTRIC BYPASS    . GASTRIC BYPASS  2000  . leg lift  2008   had aspiration in lungs, fluid was "drained" per pt/this was done at outpatient center/RM  . Stomach tuck    . TOTAL SHOULDER REPLACEMENT      OB History    Gravida  3   Para  2   Term  2   Preterm      AB  1   Living        SAB  1   TAB      Ectopic      Multiple      Live Births               Home Medications    Prior to Admission medications   Medication Sig Start Date End Date Taking? Authorizing Provider  phentermine (ADIPEX-P) 37.5 MG tablet Take 1/2 to 1 tablet every Morning for Dieting & Weight Loss 01/26/20  Yes Unk Pinto, MD  albuterol (VENTOLIN HFA) 108 (90 Base) MCG/ACT inhaler Inhale 1-2 puffs into the lungs every 6 (six) hours as needed for wheezing or shortness of breath. 03/10/20   Robyn Haber,  MD  Arginine 500 MG CAPS Take 500 mg by mouth daily.    [provider]  aspirin 81 MG tablet Take 81 mg by mouth daily.    [provider]  b complex vitamins tablet Take 1 tablet by mouth daily.    [provider]  HYDROcodone-homatropine (HYDROMET) 5-1.5 MG/5ML syrup Take 5 mLs by mouth every 6 (six) hours as needed for cough. 03/10/20   Robyn Haber, MD  IRON, FERROUS SULFATE, PO Take 65 mg by mouth daily.    [provider]  milk thistle 175 MG tablet Take 175 mg by mouth daily.    [provider]  Multiple Vitamin (MULTIVITAMIN) tablet Take 1 tablet by mouth daily.     [provider]  NON FORMULARY Deep [place]    [provider]  Baruch Gouty DDR, cellular complex    [provider]  NON FORMULARY Zendocrine    [provider]  NON FORMULARY Mitro 2 Max    [provider]  NON FORMULARY XEO Mega, omega complex    [provider]  NON FORMULARY Tri Ease Seasonal Blend    [provider]  NON FORMULARY Bone Nutrient    [provider]  NON FORMULARY Keratin    [provider]  NON FORMULARY Collagen Beauty Builder    [provider]  polycarbophil (FIBERCON) 625 MG tablet Take 625 mg by mouth daily.    [provider]  Probiotic CAPS Take 1 capsule by mouth daily.    [provider]  TURMERIC PO Take 1,500 mg by mouth 2 (two) times daily.    [provider]  Vitamin D, Ergocalciferol, (DRISDOL) 50000 units CAPS capsule Take 1 capsule (50,000 Units total) by mouth daily. 06/23/18   Unk Pinto, MD  topiramate (TOPAMAX) 50 MG tablet Take 1/2 to 1 tablet 2 x /day at Suppertime & Bedtime for Dieting & Weight Loss 01/26/20 03/10/20  Unk Pinto, MD    Family History Family History  Problem Relation Age of Onset  . Coronary artery disease Father        MI at age 69  . Heart disease Father   . Hypertension Father   . Diabetes Father   . Skin cancer Mother   . Hypertension Mother   . Hyperlipidemia Mother   . Breast cancer Maternal Aunt   . Cancer Maternal Aunt   . Breast cancer Paternal Aunt   . Breast cancer Paternal Uncle   . Cancer Cousin   . Colon cancer Neg Hx     Social History Social History   Tobacco Use  . Smoking status: Never Smoker  . Smokeless tobacco: Never Used  Substance Use Topics  . Alcohol use: Yes    Alcohol/week: 3.0 standard drinks    Types: 1 Standard drinks or equivalent, 2 Glasses of wine per week    Comment: Occasional/social   . Drug use: No     Allergies   Latex   Review of Systems Review of Systems   Physical Exam Triage Vital Signs ED Triage Vitals  Enc Vitals Group     BP      Pulse      Resp      Temp      Temp src      SpO2      Weight      Height      Head  Circumference      Peak Flow  Pain Score      Pain Loc      Pain Edu?      Excl. in [place]?    No data found.  Updated Vital Signs BP 137/89 (BP Location: Left Arm)   Pulse 77   Temp 97.9 F (36.6 C) (Temporal)   Resp 18   SpO2 97%    Physical Exam Vitals and nursing note reviewed.  Constitutional:      Appearance: Normal appearance. She is normal weight.  HENT:     Head: Normocephalic.     Nose: Nose normal.     Mouth/Throat:     Pharynx: Oropharynx is clear.  Eyes:     Conjunctiva/sclera: Conjunctivae normal.  Cardiovascular:     Rate and Rhythm: Normal rate.  Pulmonary:     Effort: Pulmonary effort is normal.     Breath sounds: Wheezing and rales present.  Musculoskeletal:        General: Normal range of motion.     Cervical back: Normal range of motion and neck supple.  Skin:    General: Skin is warm and dry.  Neurological:     General: No focal deficit present.     Mental Status: She is alert.  Psychiatric:        Mood and Affect: Mood normal.        Behavior: Behavior normal.        Thought Content: Thought content normal.        Judgment: Judgment normal.      UC Treatments / Results  Labs (all labs ordered are listed, but only abnormal results are displayed) Labs Reviewed  NOVEL CORONAVIRUS, NAA    EKG   Radiology No results found.  Procedures Procedures (including critical care time)  Medications Ordered in UC Medications - No data to display  Initial Impression / Assessment and Plan / UC Course  I have reviewed the triage vital signs and the nursing notes.  Pertinent labs & imaging results that were available during my care of the patient were reviewed by me and considered in my medical decision making (see chart for details).    Final Clinical Impressions(s) / UC Diagnoses   Final diagnoses:  Exposure to COVID-19 virus  Cough  Bronchospasm     Discharge Instructions     Continue your vitamins  Covid test should be  back Tuesday    ED Prescriptions    Medication Sig Dispense Auth. Provider   HYDROcodone-homatropine (HYDROMET) 5-1.5 MG/5ML syrup Take 5 mLs by mouth every 6 (six) hours as needed for cough. 120 mL Robyn Haber, MD   albuterol (VENTOLIN HFA) 108 (90 Base) MCG/ACT inhaler Inhale 1-2 puffs into the lungs every 6 (six) hours as needed for wheezing or shortness of breath. 18 g Robyn Haber, MD     I have reviewed the PDMP during this encounter.   Robyn Haber, MD 03/10/20 [phone]

## 2020-03-10 NOTE — Discharge Instructions (Addendum)
Continue your vitamins  Covid test should be back Tuesday

## 2020-03-11 LAB — NOVEL CORONAVIRUS, NAA: SARS-CoV-2, NAA: DETECTED — AB

## 2020-03-11 LAB — SARS-COV-2, NAA 2 DAY TAT

## 2020-03-12 ENCOUNTER — Telehealth (INDEPENDENT_AMBULATORY_CARE_PROVIDER_SITE_OTHER): Payer: Self-pay | Admitting: Adult Health

## 2020-03-12 ENCOUNTER — Telehealth: Payer: Self-pay | Admitting: Unknown Physician Specialty

## 2020-03-12 ENCOUNTER — Telehealth: Payer: Self-pay | Admitting: Nurse Practitioner

## 2020-03-12 NOTE — Telephone Encounter (Signed)
Called toDiscuss with patient about Covid symptoms and the use of bamlanivimab, a monoclonal antibody infusion for those with mild to moderate COVID-19 symptoms and at a high risk of hospitalization.   Pt is qualified for this infusion at the [facility] infusion center due to co-morbid conditions and/or a member of an at-risk group.   Unable to reach pt or leave a VM.  MyChart message previously sent.  Mina Marble, NP-C

## 2020-03-12 NOTE — Telephone Encounter (Signed)
Called to discuss with patient about Covid symptoms and the use of bamlanivimab, a monoclonal antibody infusion for those with mild to moderate Covid symptoms and at a high risk of hospitalization.  Pt is qualified for this infusion at the [facility] infusion center due to BMI>35 and hypertension   Mailbox is full and unable to leave message

## 2020-03-12 NOTE — Telephone Encounter (Signed)
Called to Discuss with patient about Covid symptoms and the use of bamlanivimab, a monoclonal antibody infusion for those with mild to moderate Covid symptoms and at a high risk of hospitalization.     Pt is qualified for this infusion at the [facility] infusion center due to co-morbid conditions and/or a member of an at-risk group.     Unable to reach pt

## 2020-04-22 ENCOUNTER — Other Ambulatory Visit: Payer: Self-pay | Admitting: Internal Medicine

## 2020-04-22 DIAGNOSIS — Z1231 Encounter for screening mammogram for malignant neoplasm of breast: Secondary | ICD-10-CM

## 2020-07-26 ENCOUNTER — Ambulatory Visit (INDEPENDENT_AMBULATORY_CARE_PROVIDER_SITE_OTHER): Payer: BC Managed Care – PPO | Admitting: Physician Assistant

## 2020-07-26 ENCOUNTER — Encounter: Payer: Self-pay | Admitting: Physician Assistant

## 2020-07-26 ENCOUNTER — Other Ambulatory Visit: Payer: Self-pay

## 2020-07-26 VITALS — BP 122/80 | HR 96 | Temp 97.7°F | Wt 147.0 lb

## 2020-07-26 DIAGNOSIS — Z79899 Other long term (current) drug therapy: Secondary | ICD-10-CM

## 2020-07-26 DIAGNOSIS — R0989 Other specified symptoms and signs involving the circulatory and respiratory systems: Secondary | ICD-10-CM

## 2020-07-26 DIAGNOSIS — E559 Vitamin D deficiency, unspecified: Secondary | ICD-10-CM | POA: Diagnosis not present

## 2020-07-26 DIAGNOSIS — E782 Mixed hyperlipidemia: Secondary | ICD-10-CM

## 2020-07-26 DIAGNOSIS — R7309 Other abnormal glucose: Secondary | ICD-10-CM | POA: Diagnosis not present

## 2020-07-26 DIAGNOSIS — E669 Obesity, unspecified: Secondary | ICD-10-CM

## 2020-07-26 LAB — CBC WITH DIFFERENTIAL/PLATELET
Absolute Monocytes: 501 cells/uL (ref 200–950)
Basophils Absolute: 23 cells/uL (ref 0–200)
Basophils Relative: 0.3 %
Eosinophils Absolute: 308 cells/uL (ref 15–500)
Eosinophils Relative: 4 %
HCT: 41.9 % (ref 35.0–45.0)
Hemoglobin: 13.9 g/dL (ref 11.7–15.5)
Lymphs Abs: 1656 cells/uL (ref 850–3900)
MCH: 31.7 pg (ref 27.0–33.0)
MCHC: 33.2 g/dL (ref 32.0–36.0)
MCV: 95.7 fL (ref 80.0–100.0)
MPV: 11.6 fL (ref 7.5–12.5)
Monocytes Relative: 6.5 %
Neutro Abs: 5213 cells/uL (ref 1500–7800)
Neutrophils Relative %: 67.7 %
Platelets: 232 10*3/uL (ref 140–400)
RBC: 4.38 10*6/uL (ref 3.80–5.10)
RDW: 11.8 % (ref 11.0–15.0)
Total Lymphocyte: 21.5 %
WBC: 7.7 10*3/uL (ref 3.8–10.8)

## 2020-07-26 LAB — LIPID PANEL
Cholesterol: 160 mg/dL (ref <200)
HDL: 52 mg/dL (ref >=50)
LDL Cholesterol (Calc): 91 mg/dL (calc)
Non-HDL Cholesterol (Calc): 108 mg/dL (calc) (ref <130)
Total CHOL/HDL Ratio: 3.1 (calc) (ref <5.0)
Triglycerides: 83 mg/dL (ref <150)

## 2020-07-26 LAB — COMPLETE METABOLIC PANEL WITH GFR
AG Ratio: 2.1 (calc) (ref 1.0–2.5)
ALT: 19 U/L (ref 6–29)
AST: 16 U/L (ref 10–35)
Albumin: 4.2 g/dL (ref 3.6–5.1)
Alkaline phosphatase (APISO): 80 U/L (ref 37–153)
BUN: 18 mg/dL (ref 7–25)
CO2: 27 mmol/L (ref 20–32)
Calcium: 9.8 mg/dL (ref 8.6–10.4)
Chloride: 110 mmol/L (ref 98–110)
Creat: 0.77 mg/dL (ref 0.50–1.05)
GFR, Est African American: 98 mL/min/{1.73_m2} (ref >=60)
GFR, Est Non African American: 85 mL/min/{1.73_m2} (ref >=60)
Globulin: 2 g/dL (calc) (ref 1.9–3.7)
Glucose, Bld: 102 mg/dL — ABNORMAL HIGH (ref 65–99)
Potassium: 5.3 mmol/L (ref 3.5–5.3)
Sodium: 144 mmol/L (ref 135–146)
Total Bilirubin: 0.4 mg/dL (ref 0.2–1.2)
Total Protein: 6.2 g/dL (ref 6.1–8.1)

## 2020-07-26 LAB — TSH: TSH: 2.29 mIU/L (ref 0.40–4.50)

## 2020-07-26 LAB — MAGNESIUM: Magnesium: 2.4 mg/dL (ref 1.5–2.5)

## 2020-07-26 LAB — VITAMIN D 25 HYDROXY (VIT D DEFICIENCY, FRACTURES): Vit D, 25-Hydroxy: 132 ng/mL — ABNORMAL HIGH (ref 30–100)

## 2020-07-26 MED ORDER — PHENTERMINE HCL 37.5 MG PO TABS: ORAL_TABLET | ORAL | 1 refills | Status: DC

## 2020-07-26 MED ORDER — TOPIRAMATE 50 MG PO TABS: ORAL_TABLET | ORAL | 1 refills | Status: DC

## 2020-07-26 NOTE — Progress Notes (Signed)
Assessment and Plan:  Essential hypertension - continue medications, DASH diet, exercise and monitor at home. Call if greater than 130/80.  -     CBC with Differential/Platelet -     COMPLETE METABOLIC PANEL WITH GFR -     Lipid panel  Mixed hyperlipidemia check lipids decrease fatty foods increase activity.   Other abnormal glucose Discussed disease progression and risks Discussed diet/exercise, weight management and risk modification  Medication management -     Magnesium  Vitamin D deficiency -     VITAMIN D 25 Hydroxy (Vit-D Deficiency, Fractures)    Future Appointments  Date Time Provider [place]  02/11/2021  3:00 PM Unk Pinto, MD [facility] None    Continue diet and meds as discussed. Further disposition pending results of labs. Over 30 minutes of exam, counseling, chart review, and critical decision making was performed  HPI 59 y.o. female  presents for 3 month follow up on hypertension, cholesterol, prediabetes, and vitamin D deficiency.    Her blood pressure has been controlled at home, today their BP is BP: 122/80  She does not workout.    She had COVID in March, has been vaccinated. She states she is better other than she still has increase HA. No changes in vision, no dizziness, no tinnitus or decreasing hearing. Ibuprofen will help.   Had stress test Dr. Stanford Breed 2013 Echo 5638 diastolic HF BMI is Body mass index is 27.55 kg/m., she is working on diet and exercise.  She is on phentermine x Feb 2021 and topamax.  Wt Readings from Last 3 Encounters:  07/26/20 147 lb (66.7 kg)  01/25/20 171 lb 3.2 oz (77.7 kg)  06/14/19 166 lb 3.2 oz (75.4 kg)    She is not on cholesterol medication and denies myalgias. Her cholesterol is at goal. The cholesterol last visit was:   Lab Results  Component Value Date   CHOL 179 01/25/2020   HDL 69 01/25/2020   LDLCALC 92 01/25/2020   TRIG 87 01/25/2020   CHOLHDL 2.6 01/25/2020    She has been working on  diet and exercise for prediabetes, she has lost 100 + lbs s/p gastric bypass in 2000 and has a history of reactive hypoglycemia with secondary seizures, and denies hypoglycemia , paresthesia of the feet, polydipsia, polyuria and visual disturbances. Last A1C in the office was:  Lab Results  Component Value Date   HGBA1C 4.9 01/25/2020   Patient is on Vitamin D supplement, 50,000 5 days a week.  Lab Results  Component Value Date   VD25OH 100 01/25/2020     She has history of NASH via biopsy, follows with Dr. Deatra Ina. Lab Results  Component Value Date   ALT 46 (H) 01/25/2020   AST 38 (H) 01/25/2020   ALKPHOS 65 06/03/2017   BILITOT 0.6 01/25/2020   She has chronic lower back pain, follows with Dr. Mina Marble. Sees Dr. Berenice Primas for her knees.     Current Medications:     Current Outpatient Medications (Respiratory):  .  albuterol (VENTOLIN HFA) 108 (90 Base) MCG/ACT inhaler, Inhale 1-2 puffs into the lungs every 6 (six) hours as needed for wheezing or shortness of breath. Marland Kitchen  HYDROcodone-homatropine (HYDROMET) 5-1.5 MG/5ML syrup, Take 5 mLs by mouth every 6 (six) hours as needed for cough.  Current Outpatient Medications (Analgesics):  .  aspirin 81 MG tablet, Take 81 mg by mouth daily.  Current Outpatient Medications (Hematological):  Marland Kitchen  IRON, FERROUS SULFATE, PO, Take 65 mg by mouth daily.  Current Outpatient Medications (Other):  Marland Kitchen  Arginine 500 MG CAPS, Take 500 mg by mouth daily. Marland Kitchen  b complex vitamins tablet, Take 1 tablet by mouth daily. .  milk thistle 175 MG tablet, Take 175 mg by mouth daily. .  Multiple Vitamin (MULTIVITAMIN) tablet, Take 1 tablet by mouth daily.  .  NON FORMULARY, Deep Blue Doterra Oil .  NON FORMULARY, DDR, cellular complex .  NON FORMULARY, Zendocrine .  NON FORMULARY, Mitro 2 Max .  NON FORMULARY, XEO Mega, omega complex .  NON FORMULARY, Tri Ease Seasonal Blend .  NON FORMULARY, Bone Nutrient .  NON FORMULARY, Keratin .  NON FORMULARY, Collagen  Forensic psychologist .  phentermine (ADIPEX-P) 37.5 MG tablet, Take 1/2 to 1 tablet every Morning for Dieting & Weight Loss .  polycarbophil (FIBERCON) 625 MG tablet, Take 625 mg by mouth daily. .  Probiotic CAPS, Take 1 capsule by mouth daily. .  TURMERIC PO, Take 1,500 mg by mouth 2 (two) times daily. .  Vitamin D, Ergocalciferol, (DRISDOL) 50000 units CAPS capsule, Take 1 capsule (50,000 Units total) by mouth daily.  Medical History:  Past Medical History:  Diagnosis Date  . ADD (attention deficit disorder) 07/19/2013  . Arthritis   . Cholecystitis, acute with cholelithiasis 07/19/2013  . Chronic left shoulder pain 07/19/2013  . Dyspnea   . GERD (gastroesophageal reflux disease) 07/19/2013  . Goiter   . HTN (hypertension)   . Hypoglycemia   . Hypoglycemia 07/19/2013  . Murmur   . Pre-diabetes   . Seizure disorder, primary generalized ([place]) 07/19/2013   May be related to hypoglycemia  . Seizures ([place]) last 01/08/18   2013-grand mal due to hypoglycemia  . Sleep apnea    "mild"-not using CPAP per pt   Allergies:  Allergies  Allergen Reactions  . Latex Rash     Review of Systems:  Review of Systems  Constitutional: Negative.   HENT: Negative.   Eyes: Negative.   Respiratory: Negative.   Cardiovascular: Negative.   Gastrointestinal: Negative.   Genitourinary: Negative.   Musculoskeletal: Negative.   Skin: Negative.     Family history- Review and unchanged Social history- Review and unchanged Physical Exam: BP 122/80   Pulse 96   Temp 97.7 F (36.5 C)   Wt 147 lb (66.7 kg)   SpO2 99%   BMI 27.55 kg/m  Wt Readings from Last 3 Encounters:  07/26/20 147 lb (66.7 kg)  01/25/20 171 lb 3.2 oz (77.7 kg)  06/14/19 166 lb 3.2 oz (75.4 kg)   General Appearance: Well nourished, in no apparent distress. Eyes: PERRLA, EOMs, conjunctiva no swelling or erythema Sinuses: No Frontal/maxillary tenderness ENT/Mouth: Ext aud canals clear, TMs without erythema, bulging. No erythema, swelling,  or exudate on post pharynx.  Tonsils not swollen or erythematous. Hearing normal.  Neck: Supple, thyroid normal.  Respiratory: Respiratory effort normal, BS equal bilaterally without rales, rhonchi, wheezing or stridor.  Cardio: RRR with no MRGs. Brisk peripheral pulses without edema.  Abdomen: Soft, + BS,  Non tender, no guarding, rebound, hernias, masses. Lymphatics: Non tender without lymphadenopathy.  Musculoskeletal: Full ROM, 5/5 strength, Normal gait Skin: Warm, dry without rashes, lesions, ecchymosis.  Neuro: Cranial nerves intact. Normal muscle tone, no cerebellar symptoms. Psych: Awake and oriented X 3, normal affect, Insight and Judgment appropriate.   Vicie Mutters, PA-C 8:52 AM [facility]

## 2020-07-26 NOTE — Patient Instructions (Addendum)
TOPAMAX This medication is good for weight loss, headaches, pain This medication can cause numbness, tingling and can cause brain fog- stop if you get these If you get blurry vision or have a history of glaucoma please stop this medication.  Let me know how you are doing with this.    Folliculitis  Folliculitis is inflammation of the hair follicles. Folliculitis most commonly occurs on the scalp, thighs, legs, back, and buttocks. However, it can occur anywhere on the body. What are the causes? This condition may be caused by:  A bacterial infection (common).  A fungal infection.  A viral infection.  Contact with certain chemicals, especially oils and tars.  Shaving or waxing.  Greasy ointments or creams applied to the skin. Long-lasting folliculitis and folliculitis that keeps coming back may be caused by bacteria. This bacteria can live anywhere on your skin and is often found in the nostrils. What increases the risk? You are more likely to develop this condition if you have:  A weakened immune system.  Diabetes.  Obesity. What are the signs or symptoms? Symptoms of this condition include:  Redness.  Soreness.  Swelling.  Itching.  Small white or yellow, pus-filled, itchy spots (pustules) that appear over a reddened area. If there is an infection that goes deep into the follicle, these may develop into a boil (furuncle).  A group of closely packed boils (carbuncle). These tend to form in hairy, sweaty areas of the body. How is this diagnosed? This condition is diagnosed with a skin exam. To find what is causing the condition, your health care provider may take a sample of one of the pustules or boils for testing in a lab. How is this treated? This condition may be treated by:  Applying warm compresses to the affected areas.  Taking an antibiotic medicine or applying an antibiotic medicine to the skin.  Applying or bathing with an antiseptic solution.  Taking  an over-the-counter medicine to help with itching.  Having a procedure to drain any pustules or boils. This may be done if a pustule or boil contains a lot of pus or fluid.  Having laser hair removal. This may be done to treat long-lasting folliculitis. Follow these instructions at home: Managing pain and swelling   If directed, apply heat to the affected area as often as told by your health care provider. Use the heat source that your health care provider recommends, such as a moist heat pack or a heating pad. ? Place a towel between your skin and the heat source. ? Leave the heat on for 20-30 minutes. ? Remove the heat if your skin turns bright red. This is especially important if you are unable to feel pain, heat, or cold. You may have a greater risk of getting burned. General instructions  If you were prescribed an antibiotic medicine, take it or apply it as told by your health care provider. Do not stop using the antibiotic even if your condition improves.  Check the irritated area every day for signs of infection. Check for: ? Redness, swelling, or pain. ? Fluid or blood. ? Warmth. ? Pus or a bad smell.  Do not shave irritated skin.  Take over-the-counter and prescription medicines only as told by your health care provider.  Keep all follow-up visits as told by your health care provider. This is important. Get help right away if:  You have more redness, swelling, or pain in the affected area.  Red streaks are spreading from the  affected area.  You have a fever. Summary  Folliculitis is inflammation of the hair follicles. Folliculitis most commonly occurs on the scalp, thighs, legs, back, and buttocks.  This condition may be treated by taking an antibiotic medicine or applying an antibiotic medicine to the skin, and applying or bathing with an antiseptic solution.  If you were prescribed an antibiotic medicine, take it or apply it as told by your health care provider. Do  not stop using the antibiotic even if your condition improves.  Get help right away if you have new or worsening symptoms.  Keep all follow-up visits as told by your health care provider. This is important. This information is not intended to replace advice given to you by your health care provider. Make sure you discuss any questions you have with your health care provider. Document Revised: 07/09/2018 Document Reviewed: 07/09/2018 Elsevier Patient Education  [employer].

## 2020-10-31 ENCOUNTER — Other Ambulatory Visit: Payer: Self-pay

## 2020-10-31 ENCOUNTER — Ambulatory Visit (INDEPENDENT_AMBULATORY_CARE_PROVIDER_SITE_OTHER): Payer: BC Managed Care – PPO | Admitting: Adult Health Nurse Practitioner

## 2020-10-31 ENCOUNTER — Encounter: Payer: Self-pay | Admitting: Adult Health Nurse Practitioner

## 2020-10-31 VITALS — BP 122/80 | HR 94 | Temp 97.0°F | Resp 16 | Ht 61.25 in | Wt 145.6 lb

## 2020-10-31 DIAGNOSIS — Z23 Encounter for immunization: Secondary | ICD-10-CM | POA: Diagnosis not present

## 2020-10-31 DIAGNOSIS — R0989 Other specified symptoms and signs involving the circulatory and respiratory systems: Secondary | ICD-10-CM | POA: Diagnosis not present

## 2020-10-31 DIAGNOSIS — E559 Vitamin D deficiency, unspecified: Secondary | ICD-10-CM

## 2020-10-31 DIAGNOSIS — E782 Mixed hyperlipidemia: Secondary | ICD-10-CM | POA: Diagnosis not present

## 2020-10-31 DIAGNOSIS — E538 Deficiency of other specified B group vitamins: Secondary | ICD-10-CM

## 2020-10-31 DIAGNOSIS — Z9884 Bariatric surgery status: Secondary | ICD-10-CM

## 2020-10-31 DIAGNOSIS — Z6827 Body mass index (BMI) 27.0-27.9, adult: Secondary | ICD-10-CM

## 2020-10-31 DIAGNOSIS — Z79899 Other long term (current) drug therapy: Secondary | ICD-10-CM

## 2020-10-31 DIAGNOSIS — E669 Obesity, unspecified: Secondary | ICD-10-CM

## 2020-10-31 DIAGNOSIS — E66811 Obesity, class 1: Secondary | ICD-10-CM

## 2020-10-31 MED ORDER — PHENTERMINE HCL 37.5 MG PO TABS: ORAL_TABLET | ORAL | 1 refills | Status: DC

## 2020-10-31 NOTE — Progress Notes (Signed)
FOLLOW UP 3 MONTHS    Assessment and Plan:   Emily Graham was seen today for follow-up.  Diagnoses and all orders for this visit:  Labile hypertension Controlled today Monitor blood pressure at home; call if consistently over 130/80 Continue DASH diet.   Reminder to go to the ER if any CP, SOB, nausea, dizziness, severe HA, changes vision/speech, left arm numbness and tingling and jaw pain. -     CBC with Differential/Platelet -     COMPLETE METABOLIC PANEL WITH GFR  Hyperlipidemia, mixed Continue medications: Discussed dietary and exercise modifications Low fat diet   Need for immunization against influenza -     FLU VACCINE MDCK QUAD W/Preservative  History of gastric bypass  Vitamin D deficiency Continue supplementation to maintain goal of 70-100 Taking Vitamin D 50,000 IU daily -     VITAMIN D 25 Hydroxy (Vit-D Deficiency, Fractures)  B12 deficiency Continue supplementation -     Vitamin B12  BMI 27.0-27.9,adult Discussed dietary and exercise modifications  Medication management Continued  Obesity (BMI 30.0-34.9) -     phentermine (ADIPEX-P) 37.5 MG tablet; Take 1/2 to 1 tablet every Morning for Dieting & Weight Loss    Further disposition pending results if labs check today. Discussed med's effects and SE's.   Over 30 minutes of face to face interview, exam, counseling, chart review, and critical decision making was performed.    Future Appointments  Date Time Provider [place]  10/31/2020  9:30 AM Garnet Sierras, NP [facility] None  02/11/2021  3:00 PM Unk Pinto, MD [facility] None  HPI 59 y.o. female  presents for 3 month follow up on HTN, HLD, prediabetes, and vitamin D deficiency.    Her blood pressure has been controlled at home, today their BP is    She does not workout.    She had COVID in March, has been vaccinated. She states she is better other than she still has increase HA. No changes in vision, no dizziness, no tinnitus or  decreasing hearing. Ibuprofen will help.   Had stress test Dr. Stanford Breed 2013 Echo 8264 diastolic HF BMI is There is no height or weight on file to calculate BMI., she is working on diet and exercise.  She is on phentermine x Feb 2021 and topamax.  Wt Readings from Last 3 Encounters:  07/26/20 147 lb (66.7 kg)  01/25/20 171 lb 3.2 oz (77.7 kg)  06/14/19 166 lb 3.2 oz (75.4 kg)    She is not on cholesterol medication and denies myalgias. Her cholesterol is at goal. The cholesterol last visit was:   Lab Results  Component Value Date   CHOL 160 07/26/2020   HDL 52 07/26/2020   LDLCALC 91 07/26/2020   TRIG 83 07/26/2020   CHOLHDL 3.1 07/26/2020    She has been working on diet and exercise for prediabetes, she has lost 100 + lbs s/p gastric bypass in 2000 and has a history of reactive hypoglycemia with secondary seizures, and denies hypoglycemia , paresthesia of the feet, polydipsia, polyuria and visual disturbances. Last A1C in the office was:  Lab Results  Component Value Date   HGBA1C 4.9 01/25/2020   Patient is on Vitamin D supplement, 50,000 5 days a week.  Lab Results  Component Value Date   VD25OH 132 (H) 07/26/2020     She has history of NASH via biopsy, follows with Dr. Deatra Ina. Lab Results  Component Value Date   ALT 19 07/26/2020   AST 16 07/26/2020   ALKPHOS  65 06/03/2017   BILITOT 0.4 07/26/2020   She has chronic lower back pain, follows with Dr. Mina Marble. Sees Dr. Berenice Primas for her knees.     Current Medications:     Current Outpatient Medications (Respiratory):  .  albuterol (VENTOLIN HFA) 108 (90 Base) MCG/ACT inhaler, Inhale 1-2 puffs into the lungs every 6 (six) hours as needed for wheezing or shortness of breath.  Current Outpatient Medications (Analgesics):  .  aspirin 81 MG tablet, Take 81 mg by mouth daily.  Current Outpatient Medications (Hematological):  Marland Kitchen  IRON, FERROUS SULFATE, PO, Take 65 mg by mouth daily.  Current Outpatient Medications (Other):  Marland Kitchen   Arginine 500 MG CAPS, Take 500 mg by mouth daily. Marland Kitchen  b complex vitamins tablet, Take 1 tablet by mouth daily. .  milk thistle 175 MG tablet, Take 175 mg by mouth daily. .  Multiple Vitamin (MULTIVITAMIN) tablet, Take 1 tablet by mouth daily.  .  NON FORMULARY, Deep Blue Doterra Oil .  NON FORMULARY, DDR, cellular complex .  NON FORMULARY, Zendocrine .  NON FORMULARY, Mitro 2 Max .  NON FORMULARY, XEO Mega, omega complex .  NON FORMULARY, Tri Ease Seasonal Blend .  NON FORMULARY, Bone Nutrient .  NON FORMULARY, Keratin .  NON FORMULARY, Collagen Forensic psychologist .  phentermine (ADIPEX-P) 37.5 MG tablet, Take 1/2 to 1 tablet every Morning for Dieting & Weight Loss .  polycarbophil (FIBERCON) 625 MG tablet, Take 625 mg by mouth daily. .  Probiotic CAPS, Take 1 capsule by mouth daily. Marland Kitchen  topiramate (TOPAMAX) 50 MG tablet, Take 1/2 to 1 tablet 2 x /day at Suppertime & Bedtime for Dieting & Weight Loss .  TURMERIC PO, Take 1,500 mg by mouth 2 (two) times daily. .  Vitamin D, Ergocalciferol, (DRISDOL) 50000 units CAPS capsule, Take 1 capsule (50,000 Units total) by mouth daily.  Medical History:  Past Medical History:  Diagnosis Date  . ADD (attention deficit disorder) 07/19/2013  . Arthritis   . Cholecystitis, acute with cholelithiasis 07/19/2013  . Chronic left shoulder pain 07/19/2013  . Dyspnea   . GERD (gastroesophageal reflux disease) 07/19/2013  . Goiter   . HTN (hypertension)   . Hypoglycemia   . Hypoglycemia 07/19/2013  . Murmur   . Pre-diabetes   . Seizure disorder, primary generalized ([place]) 07/19/2013   May be related to hypoglycemia  . Seizures ([place]) last 01/08/18   2013-grand mal due to hypoglycemia  . Sleep apnea    "mild"-not using CPAP per pt   Allergies:  Allergies  Allergen Reactions  . Latex Rash     Review of Systems:  Review of Systems  Constitutional: Negative.   HENT: Negative.   Eyes: Negative.   Respiratory: Negative.   Cardiovascular: Negative.    Gastrointestinal: Negative.   Genitourinary: Negative.   Musculoskeletal: Negative.   Skin: Negative.     Family history- Review and unchanged Social history- Review and unchanged Physical Exam: There were no vitals taken for this visit. Wt Readings from Last 3 Encounters:  07/26/20 147 lb (66.7 kg)  01/25/20 171 lb 3.2 oz (77.7 kg)  06/14/19 166 lb 3.2 oz (75.4 kg)   General Appearance: Well nourished, in no apparent distress. Eyes: PERRLA, EOMs, conjunctiva no swelling or erythema Sinuses: No Frontal/maxillary tenderness ENT/Mouth: Ext aud canals clear, TMs without erythema, bulging. No erythema, swelling, or exudate on post pharynx.  Tonsils not swollen or erythematous. Hearing normal.  Neck: Supple, thyroid normal.  Respiratory: Respiratory effort normal, BS  equal bilaterally without rales, rhonchi, wheezing or stridor.  Cardio: RRR with no MRGs. Brisk peripheral pulses without edema.  Abdomen: Soft, + BS,  Non tender, no guarding, rebound, hernias, masses. Lymphatics: Non tender without lymphadenopathy.  Musculoskeletal: Full ROM, 5/5 strength, Normal gait Skin: Warm, dry without rashes, lesions, ecchymosis.  Neuro: Cranial nerves intact. Normal muscle tone, no cerebellar symptoms. Psych: Awake and oriented X 3, normal affect, Insight and Judgment appropriate.   Garnet Sierras, NP 9:03 AM [facility]

## 2020-11-01 LAB — COMPLETE METABOLIC PANEL WITH GFR
AG Ratio: 2 (calc) (ref 1.0–2.5)
ALT: 25 U/L (ref 6–29)
AST: 21 U/L (ref 10–35)
Albumin: 4.4 g/dL (ref 3.6–5.1)
Alkaline phosphatase (APISO): 92 U/L (ref 37–153)
BUN: 14 mg/dL (ref 7–25)
CO2: 26 mmol/L (ref 20–32)
Calcium: 9.7 mg/dL (ref 8.6–10.4)
Chloride: 109 mmol/L (ref 98–110)
Creat: 0.57 mg/dL (ref 0.50–0.99)
GFR, Est African American: 117 mL/min/{1.73_m2} (ref >=60)
GFR, Est Non African American: 101 mL/min/{1.73_m2} (ref >=60)
Globulin: 2.2 g/dL (calc) (ref 1.9–3.7)
Glucose, Bld: 85 mg/dL (ref 65–99)
Potassium: 4.8 mmol/L (ref 3.5–5.3)
Sodium: 144 mmol/L (ref 135–146)
Total Bilirubin: 0.5 mg/dL (ref 0.2–1.2)
Total Protein: 6.6 g/dL (ref 6.1–8.1)

## 2020-11-01 LAB — CBC WITH DIFFERENTIAL/PLATELET
Absolute Monocytes: 390 cells/uL (ref 200–950)
Basophils Absolute: 13 cells/uL (ref 0–200)
Basophils Relative: 0.2 %
Eosinophils Absolute: 338 cells/uL (ref 15–500)
Eosinophils Relative: 5.2 %
HCT: 46.4 % — ABNORMAL HIGH (ref 35.0–45.0)
Hemoglobin: 15.6 g/dL — ABNORMAL HIGH (ref 11.7–15.5)
Lymphs Abs: 1229 cells/uL (ref 850–3900)
MCH: 31.3 pg (ref 27.0–33.0)
MCHC: 33.6 g/dL (ref 32.0–36.0)
MCV: 93.2 fL (ref 80.0–100.0)
MPV: 11.3 fL (ref 7.5–12.5)
Monocytes Relative: 6 %
Neutro Abs: 4531 cells/uL (ref 1500–7800)
Neutrophils Relative %: 69.7 %
Platelets: 230 10*3/uL (ref 140–400)
RBC: 4.98 10*6/uL (ref 3.80–5.10)
RDW: 11.8 % (ref 11.0–15.0)
Total Lymphocyte: 18.9 %
WBC: 6.5 10*3/uL (ref 3.8–10.8)

## 2020-11-01 LAB — VITAMIN D 25 HYDROXY (VIT D DEFICIENCY, FRACTURES): Vit D, 25-Hydroxy: 89 ng/mL (ref 30–100)

## 2020-11-01 LAB — VITAMIN B12: Vitamin B-12: 238 pg/mL (ref 200–1100)

## 2020-12-17 ENCOUNTER — Ambulatory Visit: Payer: BC Managed Care – PPO

## 2021-01-09 DIAGNOSIS — H10413 Chronic giant papillary conjunctivitis, bilateral: Secondary | ICD-10-CM | POA: Diagnosis not present

## 2021-01-09 DIAGNOSIS — H52203 Unspecified astigmatism, bilateral: Secondary | ICD-10-CM | POA: Diagnosis not present

## 2021-01-22 ENCOUNTER — Other Ambulatory Visit: Payer: Self-pay

## 2021-01-22 ENCOUNTER — Ambulatory Visit
Admission: RE | Admit: 2021-01-22 | Discharge: 2021-01-22 | Disposition: A | Discharge status: Home / Self Care | Payer: BC Managed Care – PPO | Source: Ambulatory Visit | Attending: Internal Medicine | Admitting: Internal Medicine

## 2021-01-22 DIAGNOSIS — Z1231 Encounter for screening mammogram for malignant neoplasm of breast: Secondary | ICD-10-CM | POA: Diagnosis not present

## 2021-02-10 ENCOUNTER — Encounter: Payer: Self-pay | Admitting: Internal Medicine

## 2021-02-10 NOTE — Patient Instructions (Signed)

## 2021-02-10 NOTE — Progress Notes (Signed)
Annual Screening/Preventative Visit & Comprehensive Evaluation &  Examination      This very nice 60 y.o.  MWF presents for a Screening /Preventative Visit & comprehensive evaluation and management of multiple medical co-morbidities.  Patient has been followed for labile HTN, HLD,  glucose intolerance and Vitamin D Deficiency.       labile HTN predates circa 2000. Patient's BP has been controlled at home and patient denies any cardiac symptoms as chest pain, palpitations, shortness of breath, dizziness or ankle swelling. Today's BP is at goal  -  120/82.       Patient's hyperlipidemia is controlled with diet and medications. Patient denies myalgias or other medication SE's. Last lipids were at goal:  Lab Results  Component Value Date   CHOL 160 07/26/2020   HDL 52 07/26/2020   LDLCALC 91 07/26/2020   TRIG 83 07/26/2020   CHOLHDL 3.1 07/26/2020                                            In 2000, patient has hx/o Gastric Bipass & had  lost 100# since that time. At one time she had episodes of hypoglycemic induced seizures.  Patient is monitored  for glucose intolerance. Patient denies reactive hypoglycemic symptoms, visual blurring, diabetic polys or paresthesias. Last A1c was normal & at goal:  Lab Results  Component Value Date   HGBA1C 4.9 01/25/2020       Finally, patient has history of Vitamin D Deficiency ("27" /2009)and last Vitamin D was at goal:  Lab Results  Component Value Date   VD25OH 89 10/31/2020    Current Outpatient Medications on File Prior to Visit  Medication Sig  . albuterol  HFA inhaler Inhale 1-2 puffs every 6  hours as needed   . Arginine 500 MG CAPS Take daily.  Marland Kitchen aspirin 81 MG tablet Take  daily.  Marland Kitchen b complex vitamins  Take 1 tablet  daily.  Marland Kitchen FERROUS SULFATE 65 mg Takedaily.  . milk thistle 175 MG tablet Take daily.  . Multiple Vitamin  Take 1 tablet  daily.   . Deep Blue Doterra Oil   . DDR, cellular complex   . Zendocrine   . Mitro 2 Max    . XEO Mega, omega complex   . Tri Ease Seasonal Blend   . Bone Nutrient   . Keratin   . Collagen Forensic psychologist   . phentermine  37.5 MG tablet Take 1/2 to 1 tablet every Morning f  . FIBERCON 625 MG  Take  daily.  . Probiotic CAPS Take 1 capsule daily.  Marland Kitchen topiramate 50 MG tablet Take 1/2 to 1 tablet 2 x /day   . TURMERIC 1,500 mg  Take  2 times daily.  . Vitamin D 50,000 units  Take 1 capsule daily.     Allergies  Allergen Reactions  . Latex Rash    Past Medical History:  Diagnosis Date  . ADD (attention deficit disorder) 07/19/2013  . Arthritis   . Cholecystitis, acute with cholelithiasis 07/19/2013  . Chronic left shoulder pain 07/19/2013  . Dyspnea   . GERD (gastroesophageal reflux disease) 07/19/2013  . Goiter   . HTN (hypertension)   . Hypoglycemia   . Hypoglycemia 07/19/2013  . Murmur   . Pre-diabetes   . Seizure disorder, primary generalized ([place]) 07/19/2013   May be related to hypoglycemia  .  Seizures ([place]) last 01/08/18   2013-grand mal due to hypoglycemia  . Sleep apnea    "mild"-not using CPAP per pt    Health Maintenance  Topic Date Due  . COVID-19 Vaccine (1) Never done  . PAP SMEAR-Modifier  Never done  . MAMMOGRAM  01/22/2023  . COLONOSCOPY (Pts 45-18yr Insurance coverage will need to be confirmed)  01/29/2028  . TETANUS/TDAP  01/24/2030  . INFLUENZA VACCINE  Completed  . Hepatitis C Screening  Completed  . HIV Screening  Completed  . HPV VACCINES  Aged Out    Immunization History  Administered Date(s) Administered  . Influenza Inj Mdck Quad With Preservative 10/31/2020  . Influenza,inj,quad, With Preservative 10/04/2019  . Influenza-Unspecified 09/15/2016, 09/13/2017, 09/14/2018, 10/04/2019  . PPD Test 11/30/2016, 12/08/2018, 01/25/2020  . Pneumococcal Polysaccharide-23 12/14/1998  . Td 01/25/2020  . Tdap 11/06/2009    Last Colon - 01/28/2018 - Dr DLoletha Carrow- recc 10 yr f/u due Feb 2025  Last MGM - 01/27/2021  Past Surgical History:  Procedure  Laterality Date  . ABDOMINAL HYSTERECTOMY    . BREAST ENHANCEMENT SURGERY    . CHOLECYSTECTOMY N/A 07/20/2013   Procedure: LAPAROSCOPIC CHOLECYSTECTOMY WITH INTRAOPERATIVE CHOLANGIOGRAM;  Surgeon: DShann Medal MD;  Location: [facility];  Service: General;  Laterality: N/A;  . GASTRIC BYPASS    . GASTRIC BYPASS    . GASTRIC BYPASS  2000  . leg lift  2008   had aspiration in lungs, fluid was "drained" per pt/this was done at outpatient center/[facility]  . Stomach tuck    . TOTAL SHOULDER REPLACEMENT      Family History  Problem Relation Age of Onset  . Coronary artery disease Father        MI at age 61 . Heart disease Father   . Hypertension Father   . Diabetes Father   . Skin cancer Mother   . Hypertension Mother   . Hyperlipidemia Mother   . Breast cancer Maternal Aunt   . Cancer Maternal Aunt   . Breast cancer Paternal Aunt   . Breast cancer Paternal Uncle   . Cancer Cousin   . Colon cancer Neg Hx     Social History   Tobacco Use  . Smoking status: Never Smoker  . Smokeless tobacco: Never Used  Vaping Use  . Vaping Use: Never used  Substance Use Topics  . Alcohol use: Yes    Alcohol/week: 3.0 standard drinks    Types: 1 Standard drinks or equivalent, 2 Glasses of wine per week    Comment: Occasional/social   . Drug use: No    ROS Constitutional: Denies fever, chills, weight loss/gain, headaches, insomnia,  night sweats, and change in appetite. Does c/o fatigue. Eyes: Denies redness, blurred vision, diplopia, discharge, itchy, watery eyes.  ENT: Denies discharge, congestion, post nasal drip, epistaxis, sore throat, earache, hearing loss, dental pain, Tinnitus, Vertigo, Sinus pain, snoring.  Cardio: Denies chest pain, palpitations, irregular heartbeat, syncope, dyspnea, diaphoresis, orthopnea, PND, claudication, edema Respiratory: denies cough, dyspnea, DOE, pleurisy, hoarseness, laryngitis, wheezing.  Gastrointestinal: Denies dysphagia, heartburn, reflux, water brash, pain,  cramps, nausea, vomiting, bloating, diarrhea, constipation, hematemesis, melena, hematochezia, jaundice, hemorrhoids Genitourinary: Denies dysuria, frequency, urgency, nocturia, hesitancy, discharge, hematuria, flank pain Breast: Breast lumps, nipple discharge, bleeding.  Musculoskeletal: Denies arthralgia, myalgia, stiffness, Jt. Swelling, pain, limp, and strain/sprain. Denies falls. Skin: Denies puritis, rash, hives, warts, acne, eczema, changing in skin lesion Neuro: No weakness, tremor, incoordination, spasms, paresthesia, pain Psychiatric: Denies confusion, memory loss, sensory  loss. Denies Depression. Endocrine: Denies change in weight, skin, hair change, nocturia, and paresthesia, diabetic polys, visual blurring, hyper / hypo glycemic episodes.  Heme/Lymph: No excessive bleeding, bruising, enlarged lymph nodes.  Physical Exam  BP 120/82   Pulse 92   Temp (!) 97.2 F (36.2 C)   Resp 16   Ht 5' 1.5" (1.562 m)   Wt 155 lb 3.2 oz (70.4 kg)   SpO2 98%   BMI 28.85 kg/m   General Appearance: Well nourished, well groomed and in no apparent distress.  Eyes: PERRLA, EOMs, conjunctiva no swelling or erythema, normal fundi and vessels. Sinuses: No frontal/maxillary tenderness ENT/Mouth: EACs patent / TMs  nl. Nares clear without erythema, swelling, mucoid exudates. Oral hygiene is good. No erythema, swelling, or exudate. Tongue normal, non-obstructing. Tonsils not swollen or erythematous. Hearing normal.  Neck: Supple, thyroid not palpable. No bruits, nodes or JVD. Respiratory: Respiratory effort normal.  BS equal and clear bilateral without rales, rhonci, wheezing or stridor. Cardio: Heart sounds are normal with regular rate and rhythm and no murmurs, rubs or gallops. Peripheral pulses are normal and equal bilaterally without edema. No aortic or femoral bruits. Chest: symmetric with normal excursions and percussion. Breasts: Symmetric, without lumps, nipple discharge, retractions, or  fibrocystic changes.  Abdomen: Flat, soft with bowel sounds active. Nontender, no guarding, rebound, hernias, masses, or organomegaly.  Lymphatics: Non tender without lymphadenopathy.  Genitourinary:  Musculoskeletal: Full ROM all peripheral extremities, joint stability, 5/5 strength, and normal gait. Skin: Warm and dry without rashes, lesions, cyanosis, clubbing or  ecchymosis.  Neuro: Cranial nerves intact, reflexes equal bilaterally. Normal muscle tone, no cerebellar symptoms. Sensation intact.  Pysch: Alert and oriented X 3, normal affect, Insight and Judgment appropriate.   Assessment and Plan  1. Annual Preventative Screening Examination   2. Labile hypertension  - EKG 12-Lead - [place], RETROPERITNL ABD,  LTD - CBC with Differential/Platelet - COMPLETE METABOLIC PANEL WITH GFR - Magnesium - TSH  3. Hyperlipidemia, mixed  - EKG 12-Lead - [place], RETROPERITNL ABD,  LTD - Lipid panel - TSH  4. Abnormal glucose  - EKG 12-Lead - [place], RETROPERITNL ABD,  LTD - Hemoglobin A1c - Insulin, random  5. Vitamin D deficiency  - VITAMIN D 25 Hydroxy)  6. B12 deficiency  - Vitamin B12  7. History of gastric bypass   8. Screening for colorectal cancer  - POC Hemoccult Bld/Stl   9. Screening examination for pulmonary tuberculosis  - TB Skin Test  10. Screening for ischemic heart disease  - EKG 12-Lead  11. FHx: heart disease  - EKG 12-Lead - [place], RETROPERITNL ABD,  LTD  12. Screening for AAA (aortic abdominal aneurysm)  - [place], RETROPERITNL ABD,  LTD  13. Fatigue, unspecified type  - Iron,Total/Total Iron Binding Cap - Vitamin B12 - CBC with Differential/Platelet - COMPLETE METABOLIC PANEL WITH GFR - TSH  14. Medication management  - Urinalysis, Routine w reflex microscopic - Microalbumin / creatinine urine ratio - CBC with Differential/Platelet - COMPLETE METABOLIC PANEL WITH GFR - Magnesium - Lipid panel - TSH - Hemoglobin A1c - Insulin, random -  VITAMIN D 25 Hydroxy         Patient was counseled in prudent diet to achieve/maintain BMI less than 25 for weight control, BP monitoring, regular exercise and medications. Discussed med's effects and SE's. Screening labs and tests as requested with regular follow-up as recommended. Over 40 minutes of exam, counseling, chart review and high complex critical decision making was performed.  [name], MD

## 2021-02-11 ENCOUNTER — Other Ambulatory Visit: Payer: Self-pay

## 2021-02-11 ENCOUNTER — Ambulatory Visit (INDEPENDENT_AMBULATORY_CARE_PROVIDER_SITE_OTHER): Payer: BC Managed Care – PPO | Admitting: Internal Medicine

## 2021-02-11 VITALS — BP 120/82 | HR 92 | Temp 97.2°F | Resp 16 | Ht 61.5 in | Wt 155.2 lb

## 2021-02-11 DIAGNOSIS — R5383 Other fatigue: Secondary | ICD-10-CM

## 2021-02-11 DIAGNOSIS — Z111 Encounter for screening for respiratory tuberculosis: Secondary | ICD-10-CM

## 2021-02-11 DIAGNOSIS — Z1389 Encounter for screening for other disorder: Secondary | ICD-10-CM | POA: Diagnosis not present

## 2021-02-11 DIAGNOSIS — Z136 Encounter for screening for cardiovascular disorders: Secondary | ICD-10-CM | POA: Diagnosis not present

## 2021-02-11 DIAGNOSIS — Z13220 Encounter for screening for lipoid disorders: Secondary | ICD-10-CM | POA: Diagnosis not present

## 2021-02-11 DIAGNOSIS — Z1211 Encounter for screening for malignant neoplasm of colon: Secondary | ICD-10-CM

## 2021-02-11 DIAGNOSIS — Z0001 Encounter for general adult medical examination with abnormal findings: Secondary | ICD-10-CM

## 2021-02-11 DIAGNOSIS — Z1329 Encounter for screening for other suspected endocrine disorder: Secondary | ICD-10-CM

## 2021-02-11 DIAGNOSIS — R0989 Other specified symptoms and signs involving the circulatory and respiratory systems: Secondary | ICD-10-CM

## 2021-02-11 DIAGNOSIS — Z8249 Family history of ischemic heart disease and other diseases of the circulatory system: Secondary | ICD-10-CM | POA: Diagnosis not present

## 2021-02-11 DIAGNOSIS — Z130 Encounter for screening for diseases of the blood and blood-forming organs and certain disorders involving the immune mechanism: Secondary | ICD-10-CM | POA: Diagnosis not present

## 2021-02-11 DIAGNOSIS — Z0000 Encounter for general adult medical examination without abnormal findings: Secondary | ICD-10-CM | POA: Diagnosis not present

## 2021-02-11 DIAGNOSIS — E669 Obesity, unspecified: Secondary | ICD-10-CM

## 2021-02-11 DIAGNOSIS — R7309 Other abnormal glucose: Secondary | ICD-10-CM

## 2021-02-11 DIAGNOSIS — Z131 Encounter for screening for diabetes mellitus: Secondary | ICD-10-CM

## 2021-02-11 DIAGNOSIS — E538 Deficiency of other specified B group vitamins: Secondary | ICD-10-CM

## 2021-02-11 DIAGNOSIS — Z79899 Other long term (current) drug therapy: Secondary | ICD-10-CM

## 2021-02-11 DIAGNOSIS — E559 Vitamin D deficiency, unspecified: Secondary | ICD-10-CM | POA: Diagnosis not present

## 2021-02-11 DIAGNOSIS — E782 Mixed hyperlipidemia: Secondary | ICD-10-CM

## 2021-02-11 DIAGNOSIS — Z9884 Bariatric surgery status: Secondary | ICD-10-CM

## 2021-02-11 MED ORDER — TOPIRAMATE 50 MG PO TABS: ORAL_TABLET | ORAL | 1 refills | Status: DC

## 2021-02-11 MED ORDER — PHENTERMINE HCL 37.5 MG PO TABS: ORAL_TABLET | ORAL | 1 refills | Status: DC

## 2021-02-11 NOTE — Addendum Note (Signed)
Addended by: Unk Pinto on: 02/11/2021 10:02 PM   Modules accepted: Orders

## 2021-02-11 NOTE — Progress Notes (Signed)
AortaScan < 3 mc. Within normal limits, per Dr Melford Aase.

## 2021-02-12 LAB — LIPID PANEL
Cholesterol: 175 mg/dL (ref <200)
HDL: 75 mg/dL (ref >=50)
LDL Cholesterol (Calc): 82 mg/dL (calc)
Non-HDL Cholesterol (Calc): 100 mg/dL (calc) (ref <130)
Total CHOL/HDL Ratio: 2.3 (calc) (ref <5.0)
Triglycerides: 87 mg/dL (ref <150)

## 2021-02-12 LAB — COMPLETE METABOLIC PANEL WITH GFR
AG Ratio: 1.6 (calc) (ref 1.0–2.5)
ALT: 29 U/L (ref 6–29)
AST: 25 U/L (ref 10–35)
Albumin: 4.3 g/dL (ref 3.6–5.1)
Alkaline phosphatase (APISO): 87 U/L (ref 37–153)
BUN: 15 mg/dL (ref 7–25)
CO2: 28 mmol/L (ref 20–32)
Calcium: 9.3 mg/dL (ref 8.6–10.4)
Chloride: 105 mmol/L (ref 98–110)
Creat: 0.75 mg/dL (ref 0.50–0.99)
GFR, Est African American: 100 mL/min/{1.73_m2} (ref >=60)
GFR, Est Non African American: 87 mL/min/{1.73_m2} (ref >=60)
Globulin: 2.7 g/dL (calc) (ref 1.9–3.7)
Glucose, Bld: 84 mg/dL (ref 65–99)
Potassium: 4.3 mmol/L (ref 3.5–5.3)
Sodium: 140 mmol/L (ref 135–146)
Total Bilirubin: 0.5 mg/dL (ref 0.2–1.2)
Total Protein: 7 g/dL (ref 6.1–8.1)

## 2021-02-12 LAB — MICROALBUMIN / CREATININE URINE RATIO
Creatinine, Urine: 107 mg/dL (ref 20–275)
Microalb Creat Ratio: 28 mcg/mg creat (ref <30)
Microalb, Ur: 3 mg/dL

## 2021-02-12 LAB — CBC WITH DIFFERENTIAL/PLATELET
Absolute Monocytes: 533 cells/uL (ref 200–950)
Basophils Absolute: 22 cells/uL (ref 0–200)
Basophils Relative: 0.3 %
Eosinophils Absolute: 229 cells/uL (ref 15–500)
Eosinophils Relative: 3.1 %
HCT: 43.2 % (ref 35.0–45.0)
Hemoglobin: 14.3 g/dL (ref 11.7–15.5)
Lymphs Abs: 2235 cells/uL (ref 850–3900)
MCH: 29.7 pg (ref 27.0–33.0)
MCHC: 33.1 g/dL (ref 32.0–36.0)
MCV: 89.6 fL (ref 80.0–100.0)
MPV: 10.9 fL (ref 7.5–12.5)
Monocytes Relative: 7.2 %
Neutro Abs: 4381 cells/uL (ref 1500–7800)
Neutrophils Relative %: 59.2 %
Platelets: 284 10*3/uL (ref 140–400)
RBC: 4.82 10*6/uL (ref 3.80–5.10)
RDW: 12.2 % (ref 11.0–15.0)
Total Lymphocyte: 30.2 %
WBC: 7.4 10*3/uL (ref 3.8–10.8)

## 2021-02-12 LAB — URINALYSIS, ROUTINE W REFLEX MICROSCOPIC
Bilirubin Urine: NEGATIVE
Hgb urine dipstick: NEGATIVE
Ketones, ur: NEGATIVE
Leukocytes,Ua: NEGATIVE
Nitrite: NEGATIVE
Protein, ur: NEGATIVE
Specific Gravity, Urine: 1.024 (ref 1.001–1.03)
pH: 6 (ref 5.0–8.0)

## 2021-02-12 LAB — INSULIN, RANDOM: Insulin: 11.8 u[IU]/mL

## 2021-02-12 LAB — TSH: TSH: 2.44 mIU/L (ref 0.40–4.50)

## 2021-02-12 LAB — HEMOGLOBIN A1C
Hgb A1c MFr Bld: 5.2 % of total Hgb (ref <5.7)
Mean Plasma Glucose: 103 mg/dL
eAG (mmol/L): 5.7 mmol/L

## 2021-02-12 LAB — IRON, TOTAL/TOTAL IRON BINDING CAP
%SAT: 11 % (calc) — ABNORMAL LOW (ref 16–45)
Iron: 50 ug/dL (ref 45–160)
TIBC: 474 mcg/dL (calc) — ABNORMAL HIGH (ref 250–450)

## 2021-02-12 LAB — MAGNESIUM: Magnesium: 2.2 mg/dL (ref 1.5–2.5)

## 2021-02-12 LAB — VITAMIN B12: Vitamin B-12: 226 pg/mL (ref 200–1100)

## 2021-02-12 LAB — VITAMIN D 25 HYDROXY (VIT D DEFICIENCY, FRACTURES): Vit D, 25-Hydroxy: 60 ng/mL (ref 30–100)

## 2021-02-12 NOTE — Progress Notes (Signed)
============================================================ -   Test results slightly outside the reference range are not unusual. If there is anything important, I will review this with you,  otherwise it is considered normal test values.  If you have further questions,  please do not hesitate to contact me at the office or via My Chart.  ============================================================ ============================================================  -  Iron level is very low - due to Gastric Bipass surgery, So. . . . . . . . . .  - Recommend increase OTC slow release Iron  3 x /day  with Meals ============================================================ ============================================================  -  Great Work  ! ============================================================ ============================================================     -  Vitamin B12 = 226 - Also,  Very Low  (Ideal or Goal Vit B12 is between 450 - 1,100)   Low Vit B12 may be associated with Anemia , Fatigue,   Peripheral Neuropathy, Dementia, "Brain Fog", & Depression  - Recommend take a sub-lingual form of Vitamin B12 tablet   1,000 to 5,000 mcg tab that you dissolve under your tongue /Daily   - Can get Baron Sane - best price at [employer] or on [employer]    ( Must get the sublingual form of B12 that dissolves under tongue )  ============================================================ ============================================================  -  Total Chol = 175  and  LDL Chol = 82 - Both  Excellent   - Very low risk for Heart Attack  / Stroke ============================================================ ============================================================  -  A1c - Normal - Great - No Diabetes  ============================================================ ============================================================  -  Vitamin D = 60 - Excellent   ============================================================ ============================================================  -  All Else - CBC - Kidneys - Electrolytes - Liver - Magnesium & Thyroid    -  all  Normal / OK ============================================================

## 2021-05-29 DIAGNOSIS — M1711 Unilateral primary osteoarthritis, right knee: Secondary | ICD-10-CM | POA: Diagnosis not present

## 2021-05-29 DIAGNOSIS — M1712 Unilateral primary osteoarthritis, left knee: Secondary | ICD-10-CM | POA: Diagnosis not present

## 2021-07-07 DIAGNOSIS — M1711 Unilateral primary osteoarthritis, right knee: Secondary | ICD-10-CM | POA: Diagnosis not present

## 2021-07-07 DIAGNOSIS — M1712 Unilateral primary osteoarthritis, left knee: Secondary | ICD-10-CM | POA: Diagnosis not present

## 2021-07-14 NOTE — Progress Notes (Deleted)
FOLLOW UP 3 MONTHS    Assessment and Plan:   Emily Graham was seen today for follow-up.  Diagnoses and all orders for this visit:  Labile hypertension Controlled today Monitor blood pressure at home; call if consistently over 130/80 Continue DASH diet.   Reminder to go to the ER if any CP, SOB, nausea, dizziness, severe HA, changes vision/speech, left arm numbness and tingling and jaw pain. -     CBC with Differential/Platelet -     COMPLETE METABOLIC PANEL WITH GFR  Hyperlipidemia, mixed Continue medications: Discussed dietary and exercise modifications Low fat diet   Need for immunization against influenza -     FLU VACCINE MDCK QUAD W/Preservative  History of gastric bypass  Vitamin D deficiency Continue supplementation to maintain goal of 70-100 Taking Vitamin D 50,000 IU daily -     VITAMIN D 25 Hydroxy (Vit-D Deficiency, Fractures)  B12 deficiency Continue supplementation -     Vitamin B12  BMI 27.0-27.9,adult Discussed dietary and exercise modifications  Medication management Continued  Obesity (BMI 30.0-34.9) -     phentermine (ADIPEX-P) 37.5 MG tablet; Take 1/2 to 1 tablet every Morning for Dieting & Weight Loss    Further disposition pending results if labs check today. Discussed med's effects and SE's.   Over 30 minutes of face to face interview, exam, counseling, chart review, and critical decision making was performed.    Future Appointments  Date Time Provider [place]  07/16/2021 11:30 AM Magda Bernheim, NP [facility] None  09/02/2021  2:30 PM Liane Comber, NP [facility] None  02/18/2022  3:00 PM Unk Pinto, MD [facility] None  HPI 60 y.o. female  presents for 3 month follow up on HTN, HLD, prediabetes, and vitamin D deficiency.    Her blood pressure has been controlled at home, today their BP is    She does not workout.    She had COVID in March, has been vaccinated. She states she is better other than she still has increase HA. No  changes in vision, no dizziness, no tinnitus or decreasing hearing. Ibuprofen will help.   Had stress test Dr. Stanford Breed 2013 Echo 8850 diastolic HF BMI is There is no height or weight on file to calculate BMI., she is working on diet and exercise.  She is on phentermine x Feb 2021 and topamax.  Wt Readings from Last 3 Encounters:  02/11/21 155 lb 3.2 oz (70.4 kg)  10/31/20 145 lb 9.6 oz (66 kg)  07/26/20 147 lb (66.7 kg)    She is not on cholesterol medication and denies myalgias. Her cholesterol is at goal. The cholesterol last visit was:   Lab Results  Component Value Date   CHOL 175 02/11/2021   HDL 75 02/11/2021   LDLCALC 82 02/11/2021   TRIG 87 02/11/2021   CHOLHDL 2.3 02/11/2021    She has been working on diet and exercise for prediabetes, she has lost 100 + lbs s/p gastric bypass in 2000 and has a history of reactive hypoglycemia with secondary seizures, and denies hypoglycemia , paresthesia of the feet, polydipsia, polyuria and visual disturbances. Last A1C in the office was:  Lab Results  Component Value Date   HGBA1C 5.2 02/11/2021   Patient is on Vitamin D supplement, 50,000 5 days a week.  Lab Results  Component Value Date   VD25OH 60 02/11/2021     She has history of NASH via biopsy, follows with Dr. Deatra Ina. Lab Results  Component Value Date   ALT 29  02/11/2021   AST 25 02/11/2021   ALKPHOS 65 06/03/2017   BILITOT 0.5 02/11/2021   She has chronic lower back pain, follows with Dr. Mina Marble. Sees Dr. Berenice Primas for her knees.     Current Medications:     Current Outpatient Medications (Respiratory):    albuterol (VENTOLIN HFA) 108 (90 Base) MCG/ACT inhaler, Inhale 1-2 puffs into the lungs every 6 (six) hours as needed for wheezing or shortness of breath.  Current Outpatient Medications (Analgesics):    aspirin 81 MG tablet, Take 81 mg by mouth daily.  Current Outpatient Medications (Hematological):    IRON, FERROUS SULFATE, PO, Take 65 mg by mouth  daily.  Current Outpatient Medications (Other):    Arginine 500 MG CAPS, Take 500 mg by mouth daily.   b complex vitamins tablet, Take 1 tablet by mouth daily.   milk thistle 175 MG tablet, Take 175 mg by mouth daily.   Multiple Vitamin (MULTIVITAMIN) tablet, Take 1 tablet by mouth daily.    NON FORMULARY, Deep Blue Doterra Oil   NON FORMULARY, DDR, cellular complex   NON FORMULARY, XEO Mega, omega complex   NON FORMULARY, Tri Ease Seasonal Blend   NON FORMULARY, Bone Nutrient   NON FORMULARY, Keratin   NON FORMULARY, Collagen Beauty Builder   phentermine (ADIPEX-P) 37.5 MG tablet, Take 1/2 to 1 tablet every Morning for Dieting & Weight Loss   polycarbophil (FIBERCON) 625 MG tablet, Take 625 mg by mouth daily.   Probiotic CAPS, Take 1 capsule by mouth daily.   topiramate (TOPAMAX) 50 MG tablet, Take 1/2 to 1 tablet 2 x /day at Suppertime & Bedtime for Dieting & Weight Loss   TURMERIC PO, Take 1,500 mg by mouth 2 (two) times daily.   Vitamin D, Ergocalciferol, (DRISDOL) 50000 units CAPS capsule, Take 1 capsule (50,000 Units total) by mouth daily.  Medical History:  Past Medical History:  Diagnosis Date   ADD (attention deficit disorder) 07/19/2013   Arthritis    Cholecystitis, acute with cholelithiasis 07/19/2013   Chronic left shoulder pain 07/19/2013   Dyspnea    GERD (gastroesophageal reflux disease) 07/19/2013   Goiter    HTN (hypertension)    Hypoglycemia    Hypoglycemia 07/19/2013   Murmur    Pre-diabetes    Seizure disorder, primary generalized ([place]) 07/19/2013   May be related to hypoglycemia   Seizures ([place]) last 01/08/18   2013-grand mal due to hypoglycemia   Sleep apnea    "mild"-not using CPAP per pt   Allergies:  Allergies  Allergen Reactions   Latex Rash     Review of Systems:  Review of Systems  Constitutional: Negative.   HENT: Negative.   Eyes: Negative.   Respiratory: Negative.   Cardiovascular: Negative.   Gastrointestinal: Negative.   Genitourinary:  Negative.   Musculoskeletal: Negative.   Skin: Negative.     Family history- Review and unchanged Social history- Review and unchanged Physical Exam: There were no vitals taken for this visit. Wt Readings from Last 3 Encounters:  02/11/21 155 lb 3.2 oz (70.4 kg)  10/31/20 145 lb 9.6 oz (66 kg)  07/26/20 147 lb (66.7 kg)   General Appearance: Well nourished, in no apparent distress. Eyes: PERRLA, EOMs, conjunctiva no swelling or erythema Sinuses: No Frontal/maxillary tenderness ENT/Mouth: Ext aud canals clear, TMs without erythema, bulging. No erythema, swelling, or exudate on post pharynx.  Tonsils not swollen or erythematous. Hearing normal.  Neck: Supple, thyroid normal.  Respiratory: Respiratory effort normal, BS equal bilaterally without  rales, rhonchi, wheezing or stridor.  Cardio: RRR with no MRGs. Brisk peripheral pulses without edema.  Abdomen: Soft, + BS,  Non tender, no guarding, rebound, hernias, masses. Lymphatics: Non tender without lymphadenopathy.  Musculoskeletal: Full ROM, 5/5 strength, Normal gait Skin: Warm, dry without rashes, lesions, ecchymosis.  Neuro: Cranial nerves intact. Normal muscle tone, no cerebellar symptoms. Psych: Awake and oriented X 3, normal affect, Insight and Judgment appropriate.   Magda Bernheim, NP 1:06 PM [facility]

## 2021-07-16 ENCOUNTER — Ambulatory Visit (INDEPENDENT_AMBULATORY_CARE_PROVIDER_SITE_OTHER): Payer: BC Managed Care – PPO | Admitting: Internal Medicine

## 2021-07-16 ENCOUNTER — Other Ambulatory Visit: Payer: Self-pay

## 2021-07-16 ENCOUNTER — Encounter: Payer: Self-pay | Admitting: Internal Medicine

## 2021-07-16 ENCOUNTER — Ambulatory Visit: Payer: BC Managed Care – PPO | Admitting: Nurse Practitioner

## 2021-07-16 VITALS — BP 118/78 | HR 81 | Temp 97.7°F | Resp 16 | Ht 61.5 in | Wt 152.0 lb

## 2021-07-16 DIAGNOSIS — Z136 Encounter for screening for cardiovascular disorders: Secondary | ICD-10-CM

## 2021-07-16 DIAGNOSIS — Z9884 Bariatric surgery status: Secondary | ICD-10-CM

## 2021-07-16 DIAGNOSIS — E782 Mixed hyperlipidemia: Secondary | ICD-10-CM

## 2021-07-16 DIAGNOSIS — Z01812 Encounter for preprocedural laboratory examination: Secondary | ICD-10-CM | POA: Diagnosis not present

## 2021-07-16 DIAGNOSIS — R0989 Other specified symptoms and signs involving the circulatory and respiratory systems: Secondary | ICD-10-CM

## 2021-07-16 DIAGNOSIS — E559 Vitamin D deficiency, unspecified: Secondary | ICD-10-CM

## 2021-07-16 DIAGNOSIS — Z79899 Other long term (current) drug therapy: Secondary | ICD-10-CM

## 2021-07-16 DIAGNOSIS — R7309 Other abnormal glucose: Secondary | ICD-10-CM

## 2021-07-16 DIAGNOSIS — E669 Obesity, unspecified: Secondary | ICD-10-CM

## 2021-07-16 DIAGNOSIS — E538 Deficiency of other specified B group vitamins: Secondary | ICD-10-CM

## 2021-07-16 DIAGNOSIS — Z1389 Encounter for screening for other disorder: Secondary | ICD-10-CM

## 2021-07-16 NOTE — Progress Notes (Signed)
Future Appointments  Date Time Provider [place]  07/16/2021  9:00 AM Unk Pinto, MD [facility] None  09/02/2021  - 6 month  2:30 PM Liane Comber, NP [facility] None  02/18/2022    -  CPE  3:00 PM Unk Pinto, MD [facility] None    History of Present Illness:       This very nice 60 y.o. MWF with labile HTN, HLD, glucose intolerance and Vitamin D Deficiency presents for pre-op evaluation .       Patient has ongoing Rt knee pains  failing conservative therapy and is scheduled for Rt TKA on 07/30/2021 by Dr Berenice Primas.        Patient has  been followed expectantly for hx/o labile HTN (2000) & BP has been controlled at home. Today's BP is at goal - 118/78. Patient has had no complaints of any cardiac type chest pain, palpitations, dyspnea / orthopnea / PND, dizziness, claudication, or dependent edema.       Hyperlipidemia is controlled with diet. Last Lipids were at goal:  Lab Results  Component Value Date   CHOL 175 02/11/2021   HDL 75 02/11/2021   LDLCALC 82 02/11/2021   TRIG 87 02/11/2021   CHOLHDL 2.3 02/11/2021      In 2000, patient had a Bariatric Gastric Bipass surgery subsequently losing ~ 100 #.  She had post surgical complications of hypoglycemic induced seizures. Also, the patient has been monitored for glucose intolerance and has had no recent symptoms of reactive hypoglycemia, diabetic polys, paresthesias or visual blurring.  Last A1c was normal & at goal:  Lab Results  Component Value Date   HGBA1C 5.2 02/11/2021                                                       Further, the patient also has history of Vitamin D Deficiency ("27" /2009) and supplements vitamin D without any suspected side-effects. Last vitamin D was at goal:   Lab Results  Component Value Date   VD25OH 60 02/11/2021     Current Outpatient Medications on File Prior to Visit  Medication Sig   VENTOLIN HFA inhaler Inhale 1-2 puffs  every 6 hrs as needed    Arginine 500 MG  CAPS Take daily.   aspirin 81 MG tablet Take daily.   b complex vitamins tablet Take 1 tablet  daily.   IRON, 65 mg  Take daily.   milk thistle 175 MG tablet Take daily.   Multiple Vitamin  Take 1 tablet  daily.    FIBERCON  625 MG tablet Take daily.   Probiotic CAPS Take 1 capsule daily.   TURMERIC 1,500 mg Take  2 times daily.   Vitamin D 50,000 units  Take 1 capsule daily.    Allergies  Allergen Reactions   Latex Rash    PMHx:   Past Medical History:  Diagnosis Date   ADD (attention deficit disorder) 07/19/2013   Arthritis    Cholecystitis, acute with cholelithiasis 07/19/2013   Chronic left shoulder pain 07/19/2013   Dyspnea    GERD (gastroesophageal reflux disease) 07/19/2013   Goiter    HTN (hypertension)    Hypoglycemia    Hypoglycemia 07/19/2013   Murmur    Pre-diabetes    Seizure disorder, primary generalized ([place]) 07/19/2013   May be related to  hypoglycemia   Seizures ([place]) last 01/08/18   2013-grand mal due to hypoglycemia   Sleep apnea    "mild"-not using CPAP per pt    Immunization History  Administered Date(s) Administered   Influenza Inj Mdck Quad 10/31/2020   Influenza,inj,quad 10/04/2019   Influenza 09/14/2018, 10/04/2019   PFIZER  SARS-COV-2 Vacc 05/24/2020, 06/14/2020   PPD Test 01/25/2020, 02/11/2021   Pneumococcal -23 12/14/1998   Td 01/25/2020   Tdap 11/06/2009     Past Surgical History:  Procedure Laterality Date   ABDOMINAL HYSTERECTOMY     BREAST ENHANCEMENT SURGERY     CHOLECYSTECTOMY N/A 07/20/2013   Procedure: LAPAROSCOPIC CHOLECYSTECTOMY WITH INTRAOPERATIVE CHOLANGIOGRAM;  Surgeon: Shann Medal, MD;  Location: [facility];  Service: General;  Laterality: N/A;   GASTRIC BYPASS  2000   leg lift  2008   had aspiration in lungs, fluid was "drained" per pt/this was done at outpatient center/[facility]   Stomach tuck     TOTAL SHOULDER REPLACEMENT      FHx:    Reviewed / unchanged  SHx:    Reviewed / unchanged   Systems Review:  Constitutional:  Denies fever, chills, wt changes, headaches, insomnia, fatigue, night sweats, change in appetite. Eyes: Denies redness, blurred vision, diplopia, discharge, itchy, watery eyes.  ENT: Denies discharge, congestion, post nasal drip, epistaxis, sore throat, earache, hearing loss, dental pain, tinnitus, vertigo, sinus pain, snoring.  CV: Denies chest pain, palpitations, irregular heartbeat, syncope, dyspnea, diaphoresis, orthopnea, PND, claudication or edema. Respiratory: denies cough, dyspnea, DOE, pleurisy, hoarseness, laryngitis, wheezing.  Gastrointestinal: Denies dysphagia, odynophagia, heartburn, reflux, water brash, abdominal pain or cramps, nausea, vomiting, bloating, diarrhea, constipation, hematemesis, melena, hematochezia  or hemorrhoids. Genitourinary: Denies dysuria, frequency, urgency, nocturia, hesitancy, discharge, hematuria or flank pain. Musculoskeletal: Denies arthralgias, myalgias, stiffness, jt. swelling, pain, limping or strain/sprain.  Skin: Denies pruritus, rash, hives, warts, acne, eczema or change in skin lesion(s). Neuro: No weakness, tremor, incoordination, spasms, paresthesia or pain. Psychiatric: Denies confusion, memory loss or sensory loss. Endo: Denies change in weight, skin or hair change.  Heme/Lymph: No excessive bleeding, bruising or enlarged lymph nodes.  Physical Exam  BP 118/78   Pulse 81   Temp 97.7 F (36.5 C)   Resp 16   Ht 5' 1.5" (1.562 m)   Wt 152 lb (68.9 kg)   SpO2 97%   BMI 28.26 kg/m   Appears  well nourished, well groomed  and in no distress.  Eyes: PERRLA, EOMs, conjunctiva no swelling or erythema. Sinuses: No frontal/maxillary tenderness ENT/Mouth: EAC's clear, TM's nl w/o erythema, bulging. Nares clear w/o erythema, swelling, exudates. Oropharynx clear without erythema or exudates. Oral hygiene is good. Tongue normal, non obstructing. Hearing intact.  Neck: Supple. Thyroid not palpable. Car 2+/2+ without bruits, nodes or JVD. Chest:  Respirations nl with BS clear & equal w/o rales, rhonchi, wheezing or stridor.  Cor: Heart sounds normal w/ regular rate and rhythm without sig. murmurs, gallops, clicks or rubs. Peripheral pulses normal and equal  without edema.  Abdomen: Soft & bowel sounds normal. Non-tender w/o guarding, rebound, hernias, masses or organomegaly.  Lymphatics: Unremarkable.  Musculoskeletal: Full ROM all peripheral extremities, joint stability, 5/5 strength and normal gait.  Skin: Warm, dry without exposed rashes, lesions or ecchymosis apparent.  Neuro: Cranial nerves intact, reflexes equal bilaterally. Sensory-motor testing grossly intact. Tendon reflexes grossly intact.  Pysch: Alert & oriented x 3.  Insight and judgement nl & appropriate. No ideations.  Assessment and Plan:   1. Labile hypertension  -  Continue medication, monitor blood pressure at home.  - Continue DASH diet.  Reminder to go to the ER if any CP,  SOB, nausea, dizziness, severe HA, changes vision/speech.    - CBC with Differential/Platelet - COMPLETE METABOLIC PANEL WITH GFR  2. Abnormal glucose  - Continue diet /meds  &  exercise  - Hemoglobin A1c - Insulin, random  3. Screening for ischemic heart disease  - EKG 12-Lead  4. Pre-operative laboratory examination  - Protime-INR  5. Medication management  - Patient is felt low  risk & stable for planned surgery   - Patient advised holding LD bASA 81 mg  for 7 days pre-op and may resume post op when hemostasis stable        Discussed  BP monitoring and discussed med and SE's. Recommended labs to assess and monitor clinical status with further disposition pending results of labs.  I discussed the assessment and treatment plan with the patient. The patient was provided an opportunity to ask questions and all were answered. The patient agreed with the plan and demonstrated an understanding of the instructions.  I provided over 30 minutes of exam, counseling, chart review and   complex critical decision making.    [name], MD

## 2021-07-17 LAB — COMPLETE METABOLIC PANEL WITH GFR
AG Ratio: 1.8 (calc) (ref 1.0–2.5)
ALT: 30 U/L — ABNORMAL HIGH (ref 6–29)
AST: 27 U/L (ref 10–35)
Albumin: 4.3 g/dL (ref 3.6–5.1)
Alkaline phosphatase (APISO): 81 U/L (ref 37–153)
BUN: 19 mg/dL (ref 7–25)
CO2: 29 mmol/L (ref 20–32)
Calcium: 9.4 mg/dL (ref 8.6–10.4)
Chloride: 103 mmol/L (ref 98–110)
Creat: 0.57 mg/dL (ref 0.50–1.05)
Globulin: 2.4 g/dL (calc) (ref 1.9–3.7)
Glucose, Bld: 102 mg/dL — ABNORMAL HIGH (ref 65–99)
Potassium: 4.5 mmol/L (ref 3.5–5.3)
Sodium: 140 mmol/L (ref 135–146)
Total Bilirubin: 0.6 mg/dL (ref 0.2–1.2)
Total Protein: 6.7 g/dL (ref 6.1–8.1)
eGFR: 104 mL/min/{1.73_m2} (ref >=60)

## 2021-07-17 LAB — CBC WITH DIFFERENTIAL/PLATELET
Absolute Monocytes: 642 cells/uL (ref 200–950)
Basophils Absolute: 21 cells/uL (ref 0–200)
Basophils Relative: 0.3 %
Eosinophils Absolute: 497 cells/uL (ref 15–500)
Eosinophils Relative: 7.2 %
HCT: 44.8 % (ref 35.0–45.0)
Hemoglobin: 14.6 g/dL (ref 11.7–15.5)
Lymphs Abs: 1773 cells/uL (ref 850–3900)
MCH: 30.2 pg (ref 27.0–33.0)
MCHC: 32.6 g/dL (ref 32.0–36.0)
MCV: 92.6 fL (ref 80.0–100.0)
MPV: 11.2 fL (ref 7.5–12.5)
Monocytes Relative: 9.3 %
Neutro Abs: 3968 cells/uL (ref 1500–7800)
Neutrophils Relative %: 57.5 %
Platelets: 234 10*3/uL (ref 140–400)
RBC: 4.84 10*6/uL (ref 3.80–5.10)
RDW: 13.3 % (ref 11.0–15.0)
Total Lymphocyte: 25.7 %
WBC: 6.9 10*3/uL (ref 3.8–10.8)

## 2021-07-17 LAB — HEMOGLOBIN A1C
Hgb A1c MFr Bld: 5.1 % of total Hgb (ref <5.7)
Mean Plasma Glucose: 100 mg/dL
eAG (mmol/L): 5.5 mmol/L

## 2021-07-17 LAB — INSULIN, RANDOM: Insulin: 17.8 u[IU]/mL

## 2021-07-17 LAB — PROTIME-INR
INR: 1
Prothrombin Time: 10.3 s (ref 9.0–11.5)

## 2021-07-17 NOTE — Progress Notes (Signed)
============================================================ -   Test results slightly outside the reference range are not unusual. If there is anything important, I will review this with you,  otherwise it is considered normal test values.  If you have further questions,  please do not hesitate to contact me at the office or via My Chart.  ============================================================ ============================================================  - A1c & Insulin  - both  Normal - Great - No Diabetes !  - ProTime / INR - Normal Clotting   - All Else - CBC - Kidneys - Electrolytes - Liver - all  Normal / OK ============================================================ ============================================================

## 2021-07-21 DIAGNOSIS — M1712 Unilateral primary osteoarthritis, left knee: Secondary | ICD-10-CM | POA: Diagnosis not present

## 2021-07-21 DIAGNOSIS — R531 Weakness: Secondary | ICD-10-CM | POA: Diagnosis not present

## 2021-07-21 DIAGNOSIS — R262 Difficulty in walking, not elsewhere classified: Secondary | ICD-10-CM | POA: Diagnosis not present

## 2021-07-21 DIAGNOSIS — M25662 Stiffness of left knee, not elsewhere classified: Secondary | ICD-10-CM | POA: Diagnosis not present

## 2021-07-21 DIAGNOSIS — M1711 Unilateral primary osteoarthritis, right knee: Secondary | ICD-10-CM | POA: Diagnosis not present

## 2021-07-30 DIAGNOSIS — M1712 Unilateral primary osteoarthritis, left knee: Secondary | ICD-10-CM | POA: Diagnosis not present

## 2021-09-02 ENCOUNTER — Ambulatory Visit: Payer: BC Managed Care – PPO | Admitting: Nurse Practitioner

## 2021-09-16 NOTE — Progress Notes (Signed)
FOLLOW UP  Assessment and Plan:   Emily Graham was seen today for follow-up.  Diagnoses and all orders for this visit:  Labile hypertension  Controlled without medication  - continue DASH diet, exercise and monitor at home. Call if greater than 130/80.    Hyperlipidemia, mixed -     COMPLETE METABOLIC PANEL WITH GFR -     Lipid panel -     TSH - Continue diet and exercise, limit saturated fats  Abnormal glucose  Continue diet and exercise, after surgery will continue to work on weight loss  Overweight with body mass index (BMI) of 28 to 28.9 in adult -     TSH  Continue diet and exercise and focus on weight loss after knee replacement in 11/22  Pre-operative laboratory examination -     CBC with Differential/Platelet -     COMPLETE METABOLIC PANEL WITH GFR -     Hemoglobin A1c -     Protime-INR  Vitamin D deficiency -     VITAMIN D 25 Hydroxy (Vit-D Deficiency, Fractures) Currently not taking Vit D supplementation  Screening for hematuria or proteinuria -     Urinalysis, Routine w reflex microscopic -     Microalbumin / creatinine urine ratio  Chronic pain of right knee   Pt is scheduled for Total knee replacement on right knee with Dr Berenice Primas 10/22/21  Monitor symptoms and use Tylenol as needed until surgery    Continue diet and meds as discussed. Further disposition pending results of labs. Discussed med's effects and SE's.   Over 30 minutes of exam, counseling, chart review, and critical decision making was performed.   Future Appointments  Date Time Provider [place]  02/18/2022  3:00 PM Unk Pinto, MD [facility] None    ----------------------------------------------------------------------------------------------------------------------  HPI 60 y.o. female  presents for 3 month follow up on hypertension, cholesterol, diabetes, weight and vitamin D deficiency.   BMI is Body mass index is 28.26 kg/m., she has been working on diet and exercise. Wt  Readings from Last 3 Encounters:  09/17/21 152 lb (68.9 kg)  07/16/21 152 lb (68.9 kg)  02/11/21 155 lb 3.2 oz (70.4 kg)    Her blood pressure has been controlled at home, today their BP is BP: 138/88 BP Readings from Last 3 Encounters:  09/17/21 138/88  07/16/21 118/78  02/11/21 120/82     She does workout. She denies chest pain, shortness of breath, dizziness.   She is not on cholesterol medication  Her cholesterol is at goal. The cholesterol last visit was:   Lab Results  Component Value Date   CHOL 175 02/11/2021   HDL 75 02/11/2021   LDLCALC 82 02/11/2021   TRIG 87 02/11/2021   CHOLHDL 2.3 02/11/2021    She has been working on diet and exercise for abnormal glucose. Last A1C in the office was:  Lab Results  Component Value Date   HGBA1C 5.1 07/16/2021   Patient is not currently on Vitamin D supplement.   Lab Results  Component Value Date   VD25OH 60 02/11/2021      She has had persistent chronic right knee pain and is to have a total right knee replacement with Dr Berenice Primas on 10/22/21. Left knee was replaced 07/30/21, has full ROM and Strength.  Incision healed well.   Current Medications:  Current Outpatient Medications on File Prior to Visit  Medication Sig   albuterol (VENTOLIN HFA) 108 (90 Base) MCG/ACT inhaler Inhale 1-2 puffs into the lungs every  6 (six) hours as needed for wheezing or shortness of breath.   Arginine 500 MG CAPS Take 500 mg by mouth daily.   aspirin 81 MG tablet Take 81 mg by mouth daily.   b complex vitamins tablet Take 1 tablet by mouth daily.   Cyanocobalamin (B-12) 5000 MCG SUBL Place under the tongue.   IRON, FERROUS SULFATE, PO Take 65 mg by mouth daily.   loratadine (ALLERGY) 10 MG tablet Take 10 mg by mouth daily.   milk thistle 175 MG tablet Take 175 mg by mouth daily.   Multiple Vitamin (MULTIVITAMIN) tablet Take 1 tablet by mouth daily.    NON FORMULARY XEO Mega, omega complex   NON FORMULARY Bone Nutrient   polycarbophil (FIBERCON)  625 MG tablet Take 625 mg by mouth daily.   Probiotic CAPS Take 1 capsule by mouth daily.   TURMERIC PO Take 1,500 mg by mouth 2 (two) times daily.   Vitamin D, Ergocalciferol, (DRISDOL) 50000 units CAPS capsule Take 1 capsule (50,000 Units total) by mouth daily.   NON FORMULARY Deep Blue Doterra Oil (Patient not taking: Reported on 09/17/2021)   NON FORMULARY DDR, cellular complex (Patient not taking: Reported on 09/17/2021)   NON FORMULARY Tri Ease Seasonal Blend (Patient not taking: Reported on 09/17/2021)   NON FORMULARY Keratin (Patient not taking: Reported on 09/17/2021)   NON FORMULARY Collagen Beauty Builder (Patient not taking: Reported on 09/17/2021)   phentermine (ADIPEX-P) 37.5 MG tablet Take 1/2 to 1 tablet every Morning for Dieting & Weight Loss (Patient not taking: Reported on 07/16/2021)   topiramate (TOPAMAX) 50 MG tablet Take 1/2 to 1 tablet 2 x /day at Suppertime & Bedtime for Dieting & Weight Loss (Patient not taking: No sig reported)   No current facility-administered medications on file prior to visit.     Allergies:  Allergies  Allergen Reactions   Latex Rash     Medical History:  Past Medical History:  Diagnosis Date   ADD (attention deficit disorder) 07/19/2013   Arthritis    Cholecystitis, acute with cholelithiasis 07/19/2013   Chronic left shoulder pain 07/19/2013   Dyspnea    GERD (gastroesophageal reflux disease) 07/19/2013   Goiter    HTN (hypertension)    Hypoglycemia    Hypoglycemia 07/19/2013   Murmur    Pre-diabetes    Seizure disorder, primary generalized ([place]) 07/19/2013   May be related to hypoglycemia   Seizures ([place]) last 01/08/18   2013-grand mal due to hypoglycemia   Sleep apnea    "mild"-not using CPAP per pt   Family history- Reviewed and unchanged Social history- Reviewed and unchanged   Review of Systems:  Review of Systems  Constitutional:  Negative for chills and fever.  HENT:  Negative for congestion, hearing loss, sinus pain, sore throat  and tinnitus.   Eyes:  Negative for blurred vision and double vision.  Respiratory:  Negative for cough, hemoptysis, sputum production, shortness of breath and wheezing.   Cardiovascular:  Negative for chest pain, palpitations and leg swelling.  Gastrointestinal:  Negative for abdominal pain, constipation, diarrhea, heartburn, nausea and vomiting.  Genitourinary:  Negative for dysuria and urgency.  Musculoskeletal:  Positive for joint pain (right knee). Negative for back pain, falls, myalgias and neck pain.  Skin:  Negative for rash.  Neurological:  Negative for dizziness, tingling, tremors, weakness and headaches.  Endo/Heme/Allergies:  Does not bruise/bleed easily.  Psychiatric/Behavioral:  Negative for depression and suicidal ideas. The patient has insomnia. The patient is not nervous/anxious.  Physical Exam: BP 138/88   Pulse 99   Temp 97.9 F (36.6 C)   Wt 152 lb (68.9 kg)   SpO2 95%   BMI 28.26 kg/m  Wt Readings from Last 3 Encounters:  09/17/21 152 lb (68.9 kg)  07/16/21 152 lb (68.9 kg)  02/11/21 155 lb 3.2 oz (70.4 kg)   General Appearance: Well nourished, in no apparent distress. Eyes: PERRLA, EOMs, conjunctiva no swelling or erythema Sinuses: No Frontal/maxillary tenderness ENT/Mouth: Ext aud canals clear, TMs without erythema, bulging. No erythema, swelling, or exudate on post pharynx.  Tonsils not swollen or erythematous. Hearing normal.  Neck: Supple, thyroid normal.  Respiratory: Respiratory effort normal, BS equal bilaterally without rales, rhonchi, wheezing or stridor.  Cardio: RRR with no MRGs. Brisk peripheral pulses without edema.  Abdomen: Soft, + BS.  Non tender, no guarding, rebound, hernias, masses. Lymphatics: Non tender without lymphadenopathy.  Musculoskeletal: Full ROM, 5/5 strength except right knee strength 4/5, Slight limp from right knee pain. Left knee 5/5 strength and full ROM Skin: Warm, dry without rashes, lesions, ecchymosis. Well healed  scar in left knee  Neuro: Cranial nerves intact. No cerebellar symptoms.  Psych: Awake and oriented X 3, normal affect, Insight and Judgment appropriate.    Magda Bernheim, NP 9:20 AM [facility]

## 2021-09-17 ENCOUNTER — Ambulatory Visit (INDEPENDENT_AMBULATORY_CARE_PROVIDER_SITE_OTHER): Payer: BC Managed Care – PPO | Admitting: Nurse Practitioner

## 2021-09-17 ENCOUNTER — Encounter: Payer: Self-pay | Admitting: Nurse Practitioner

## 2021-09-17 ENCOUNTER — Other Ambulatory Visit: Payer: Self-pay

## 2021-09-17 VITALS — BP 138/88 | HR 99 | Temp 97.9°F | Wt 152.0 lb

## 2021-09-17 DIAGNOSIS — M25561 Pain in right knee: Secondary | ICD-10-CM

## 2021-09-17 DIAGNOSIS — E782 Mixed hyperlipidemia: Secondary | ICD-10-CM

## 2021-09-17 DIAGNOSIS — Z01812 Encounter for preprocedural laboratory examination: Secondary | ICD-10-CM

## 2021-09-17 DIAGNOSIS — E559 Vitamin D deficiency, unspecified: Secondary | ICD-10-CM | POA: Diagnosis not present

## 2021-09-17 DIAGNOSIS — Z6828 Body mass index (BMI) 28.0-28.9, adult: Secondary | ICD-10-CM

## 2021-09-17 DIAGNOSIS — G8929 Other chronic pain: Secondary | ICD-10-CM

## 2021-09-17 DIAGNOSIS — R0989 Other specified symptoms and signs involving the circulatory and respiratory systems: Secondary | ICD-10-CM

## 2021-09-17 DIAGNOSIS — Z1389 Encounter for screening for other disorder: Secondary | ICD-10-CM

## 2021-09-17 DIAGNOSIS — R7309 Other abnormal glucose: Secondary | ICD-10-CM

## 2021-09-17 DIAGNOSIS — E663 Overweight: Secondary | ICD-10-CM

## 2021-09-17 NOTE — Patient Instructions (Signed)

## 2021-09-18 LAB — COMPLETE METABOLIC PANEL WITH GFR
AG Ratio: 2 (calc) (ref 1.0–2.5)
ALT: 24 U/L (ref 6–29)
AST: 28 U/L (ref 10–35)
Albumin: 4.7 g/dL (ref 3.6–5.1)
Alkaline phosphatase (APISO): 86 U/L (ref 37–153)
BUN: 13 mg/dL (ref 7–25)
CO2: 25 mmol/L (ref 20–32)
Calcium: 9.4 mg/dL (ref 8.6–10.4)
Chloride: 106 mmol/L (ref 98–110)
Creat: 0.63 mg/dL (ref 0.50–1.05)
Globulin: 2.4 g/dL (calc) (ref 1.9–3.7)
Glucose, Bld: 85 mg/dL (ref 65–99)
Potassium: 4.6 mmol/L (ref 3.5–5.3)
Sodium: 142 mmol/L (ref 135–146)
Total Bilirubin: 0.5 mg/dL (ref 0.2–1.2)
Total Protein: 7.1 g/dL (ref 6.1–8.1)
eGFR: 101 mL/min/{1.73_m2} (ref >=60)

## 2021-09-18 LAB — URINALYSIS, ROUTINE W REFLEX MICROSCOPIC
Bacteria, UA: NONE SEEN /HPF
Bilirubin Urine: NEGATIVE
Glucose, UA: NEGATIVE
Hgb urine dipstick: NEGATIVE
Ketones, ur: NEGATIVE
Leukocytes,Ua: NEGATIVE
Nitrite: NEGATIVE
Specific Gravity, Urine: 1.027 (ref 1.001–1.035)
pH: 5.5 (ref 5.0–8.0)

## 2021-09-18 LAB — MICROALBUMIN / CREATININE URINE RATIO
Creatinine, Urine: 151 mg/dL (ref 20–275)
Microalb Creat Ratio: 28 mcg/mg creat (ref <30)
Microalb, Ur: 4.3 mg/dL

## 2021-09-18 LAB — CBC WITH DIFFERENTIAL/PLATELET
Absolute Monocytes: 478 cells/uL (ref 200–950)
Basophils Absolute: 18 cells/uL (ref 0–200)
Basophils Relative: 0.3 %
Eosinophils Absolute: 360 cells/uL (ref 15–500)
Eosinophils Relative: 6.1 %
HCT: 45.2 % — ABNORMAL HIGH (ref 35.0–45.0)
Hemoglobin: 14.2 g/dL (ref 11.7–15.5)
Lymphs Abs: 991 cells/uL (ref 850–3900)
MCH: 28.6 pg (ref 27.0–33.0)
MCHC: 31.4 g/dL — ABNORMAL LOW (ref 32.0–36.0)
MCV: 90.9 fL (ref 80.0–100.0)
MPV: 10.7 fL (ref 7.5–12.5)
Monocytes Relative: 8.1 %
Neutro Abs: 4053 cells/uL (ref 1500–7800)
Neutrophils Relative %: 68.7 %
Platelets: 254 10*3/uL (ref 140–400)
RBC: 4.97 10*6/uL (ref 3.80–5.10)
RDW: 12.5 % (ref 11.0–15.0)
Total Lymphocyte: 16.8 %
WBC: 5.9 10*3/uL (ref 3.8–10.8)

## 2021-09-18 LAB — LIPID PANEL
Cholesterol: 186 mg/dL (ref <200)
HDL: 76 mg/dL (ref >=50)
LDL Cholesterol (Calc): 93 mg/dL (calc)
Non-HDL Cholesterol (Calc): 110 mg/dL (calc) (ref <130)
Total CHOL/HDL Ratio: 2.4 (calc) (ref <5.0)
Triglycerides: 82 mg/dL (ref <150)

## 2021-09-18 LAB — HEMOGLOBIN A1C
Hgb A1c MFr Bld: 4.7 % of total Hgb (ref <5.7)
Mean Plasma Glucose: 88 mg/dL
eAG (mmol/L): 4.9 mmol/L

## 2021-09-18 LAB — VITAMIN D 25 HYDROXY (VIT D DEFICIENCY, FRACTURES): Vit D, 25-Hydroxy: 150 ng/mL — ABNORMAL HIGH (ref 30–100)

## 2021-09-18 LAB — PROTIME-INR
INR: 1.1
Prothrombin Time: 10.7 s (ref 9.0–11.5)

## 2021-09-18 LAB — TSH: TSH: 1.54 mIU/L (ref 0.40–4.50)

## 2021-09-18 LAB — MICROSCOPIC MESSAGE

## 2021-10-09 DIAGNOSIS — M1711 Unilateral primary osteoarthritis, right knee: Secondary | ICD-10-CM | POA: Diagnosis not present

## 2021-10-22 DIAGNOSIS — M1711 Unilateral primary osteoarthritis, right knee: Secondary | ICD-10-CM | POA: Diagnosis not present

## 2022-01-20 DIAGNOSIS — H5213 Myopia, bilateral: Secondary | ICD-10-CM | POA: Diagnosis not present

## 2022-01-20 DIAGNOSIS — H10413 Chronic giant papillary conjunctivitis, bilateral: Secondary | ICD-10-CM | POA: Diagnosis not present

## 2022-02-05 DIAGNOSIS — M1711 Unilateral primary osteoarthritis, right knee: Secondary | ICD-10-CM | POA: Diagnosis not present

## 2022-02-05 DIAGNOSIS — M1712 Unilateral primary osteoarthritis, left knee: Secondary | ICD-10-CM | POA: Diagnosis not present

## 2022-02-05 DIAGNOSIS — Z9889 Other specified postprocedural states: Secondary | ICD-10-CM | POA: Diagnosis not present

## 2022-02-18 ENCOUNTER — Ambulatory Visit (INDEPENDENT_AMBULATORY_CARE_PROVIDER_SITE_OTHER): Payer: BC Managed Care – PPO | Admitting: Internal Medicine

## 2022-02-18 ENCOUNTER — Encounter: Payer: Self-pay | Admitting: Internal Medicine

## 2022-02-18 ENCOUNTER — Other Ambulatory Visit: Payer: Self-pay

## 2022-02-18 VITALS — BP 136/80 | HR 89 | Temp 97.9°F | Resp 16 | Ht 61.5 in | Wt 157.6 lb

## 2022-02-18 DIAGNOSIS — Z111 Encounter for screening for respiratory tuberculosis: Secondary | ICD-10-CM

## 2022-02-18 DIAGNOSIS — Z131 Encounter for screening for diabetes mellitus: Secondary | ICD-10-CM

## 2022-02-18 DIAGNOSIS — Z1389 Encounter for screening for other disorder: Secondary | ICD-10-CM

## 2022-02-18 DIAGNOSIS — Z0001 Encounter for general adult medical examination with abnormal findings: Secondary | ICD-10-CM

## 2022-02-18 DIAGNOSIS — Z79899 Other long term (current) drug therapy: Secondary | ICD-10-CM

## 2022-02-18 DIAGNOSIS — E782 Mixed hyperlipidemia: Secondary | ICD-10-CM

## 2022-02-18 DIAGNOSIS — R7309 Other abnormal glucose: Secondary | ICD-10-CM

## 2022-02-18 DIAGNOSIS — Z13220 Encounter for screening for lipoid disorders: Secondary | ICD-10-CM

## 2022-02-18 DIAGNOSIS — R0989 Other specified symptoms and signs involving the circulatory and respiratory systems: Secondary | ICD-10-CM

## 2022-02-18 DIAGNOSIS — Z8249 Family history of ischemic heart disease and other diseases of the circulatory system: Secondary | ICD-10-CM | POA: Diagnosis not present

## 2022-02-18 DIAGNOSIS — Z136 Encounter for screening for cardiovascular disorders: Secondary | ICD-10-CM | POA: Diagnosis not present

## 2022-02-18 DIAGNOSIS — R5383 Other fatigue: Secondary | ICD-10-CM

## 2022-02-18 DIAGNOSIS — Z1211 Encounter for screening for malignant neoplasm of colon: Secondary | ICD-10-CM

## 2022-02-18 DIAGNOSIS — Z0000 Encounter for general adult medical examination without abnormal findings: Secondary | ICD-10-CM

## 2022-02-18 DIAGNOSIS — E538 Deficiency of other specified B group vitamins: Secondary | ICD-10-CM

## 2022-02-18 DIAGNOSIS — Z130 Encounter for screening for diseases of the blood and blood-forming organs and certain disorders involving the immune mechanism: Secondary | ICD-10-CM | POA: Diagnosis not present

## 2022-02-18 DIAGNOSIS — E559 Vitamin D deficiency, unspecified: Secondary | ICD-10-CM | POA: Diagnosis not present

## 2022-02-18 MED ORDER — ALBUTEROL SULFATE HFA 108 (90 BASE) MCG/ACT IN AERS: 1.0000 | INHALATION_SPRAY | Freq: Four times a day (QID) | RESPIRATORY_TRACT | 1 refills | Status: DC | PRN

## 2022-02-18 NOTE — Progress Notes (Signed)
Annual Screening/Preventative Visit & Comprehensive Evaluation &  Examination  Future Appointments  Date Time Provider Department  02/18/2022  3:00 PM Unk Pinto, MD [facility]  02/23/2023  3:00 PM Unk Pinto, MD [facility]        This very nice 61 y.o. MWF presents for a Screening /Preventative Visit & comprehensive evaluation and management of multiple medical co-morbidities.  Patient has been followed for HTN, HLD, Prediabetes  and Vitamin D Deficiency.        Labile HTN predates since 2000.   Patient's BP has been controlled at home and patient denies any cardiac symptoms as chest pain, palpitations, shortness of breath, dizziness or ankle swelling. Today's BP - 136/80.        Patient's hyperlipidemia is controlled with diet and medications. Patient denies myalgias or other medication SE's. Last lipids were at goal :  Lab Results  Component Value Date   CHOL 186 09/17/2021   HDL 76 09/17/2021   LDLCALC 93 09/17/2021   TRIG 82 09/17/2021   CHOLHDL 2.4 09/17/2021        Patient underwent  Bariatric Gastric Bipass surgery in 2000 and has hx/o glucose intolerance and had complications of hypoglycemic induced seizures .   Patient denies in the last 10 years any recent reactive hypoglycemic symptoms, visual blurring, diabetic polys or paresthesias. Last A1c was at goal :  Lab Results  Component Value Date   HGBA1C 4.7 09/17/2021   Wt Readings from Last 3 Encounters:  02/18/22 157 lb   09/17/21 152 lb   07/16/21 152 lb        Finally, patient has history of Vitamin D Deficiency  ("27" /2009) and last Vitamin D was elevated and dose was tapered :  Lab Results  Component Value Date   VD25OH 150 (H) 09/17/2021     Current Outpatient Medications on File Prior to Visit  Medication Sig   albuterol HFA  inhaler Inhale 1-2 puffs  every 6 hours as needed    Arginine 500 MG CAPS Take daily.   aspirin 81 MG tablet Take  daily.   b complex vitamins tablet Take 1  tablet  daily.   Vitamin B-12 5000 mcg SL Place under the tongue.   IRON, FERROUS SULFATE 65 mg  Take  daily.   loratadine  10 MG tablet Take 1 daily.   milk thistle 175 MG tablet Take  daily.   Multiple Vitamin  Take 1 tablet daily.    XEO Mega, omega complex    Bone Nutrient    FIBERCON) 625 MG tablet Take  daily.   Probiotic CAPS  1,500 mg  Take 1 capsule  daily.   TURMERIC  Take 2  times daily.   Vitamin D 50,000 units CAP Take 1 capsule total daily.    Allergies  Allergen Reactions   Latex Rash    Past Medical History:  Diagnosis Date   ADD (attention deficit disorder) 07/19/2013   Arthritis    Cholecystitis, acute with cholelithiasis 07/19/2013   Chronic left shoulder pain 07/19/2013   Dyspnea    GERD (gastroesophageal reflux disease) 07/19/2013   Goiter    HTN (hypertension)    Hypoglycemia    Hypoglycemia 07/19/2013   Murmur    Pre-diabetes    Seizure disorder, primary generalized ([place]) 07/19/2013   May be related to hypoglycemia   Seizures ([place]) last 01/08/18   2013-grand mal due to hypoglycemia   Sleep apnea    "mild"-not using CPAP per pt  Health Maintenance  Topic Date Due   Zoster Vaccines- Shingrix (1 of 2) Never done   PAP SMEAR-Modifier  Never done   COVID-19 Vaccine (3 - Pfizer risk series) 07/12/2020   INFLUENZA VACCINE  07/14/2021   MAMMOGRAM  01/22/2023   TETANUS/TDAP  01/24/2030   Hepatitis C Screening  Completed   HIV Screening  Completed   HPV VACCINES  Aged Out     Immunization History  Administered Date(s) Administered   Influenza Inj Mdck  10/31/2020   Influenza 10/04/2019   Influenza 09/14/2018, 10/04/2019   PFIZER SARS-COV-2 Vacc 05/24/2020, 06/14/2020   PPD Test 12/08/2018, 01/25/2020, 02/11/2021   Pneumococcal -23 12/14/1998   Td 01/25/2020   Tdap 11/06/2009    Last Colon - 01/28/2018 - Dr Loletha Carrow - recommended repeat in 10 years - due Feb 2029   Last MGM - 01/22/2021 - patient aware    Past Surgical History:  Procedure  Laterality Date   ABDOMINAL HYSTERECTOMY     BREAST ENHANCEMENT SURGERY     CHOLECYSTECTOMY N/A 07/20/2013   LAPAROSCOPIC CHOLECYSTECTOMY WITH INTRAOPERATIVE CHOLANGIOGRAM;  Shann Medal, MD   GASTRIC BYPASS  2000   leg lift  2008   had aspiration in lungs, fluid was "drained" per pt/this was done at outpatient center/[facility]   Stomach tuck     TOTAL SHOULDER REPLACEMENT       Family History  Problem Relation Age of Onset   Coronary artery disease Father        MI at age 8   Heart disease Father    Hypertension Father    Diabetes Father    Skin cancer Mother    Hypertension Mother    Hyperlipidemia Mother    Breast cancer Maternal Aunt    Cancer Maternal Aunt    Breast cancer Paternal Aunt    Breast cancer Paternal Uncle    Cancer Cousin    Colon cancer Neg Hx      Social History   Tobacco Use   Smoking status: Never   Smokeless tobacco: Never  Vaping Use   Vaping Use: Never used  Substance Use Topics   Alcohol use: Yes    Alcohol/week: 3.0 standard drinks    Types: 1 Standard drinks or equivalent, 2 Glasses of wine per week    Comment: Occasional/social    Drug use: No      ROS Constitutional: Denies fever, chills, weight loss/gain, headaches, insomnia,  night sweats, and change in appetite. Does c/o fatigue. Eyes: Denies redness, blurred vision, diplopia, discharge, itchy, watery eyes.  ENT: Denies discharge, congestion, post nasal drip, epistaxis, sore throat, earache, hearing loss, dental pain, Tinnitus, Vertigo, Sinus pain, snoring.  Cardio: Denies chest pain, palpitations, irregular heartbeat, syncope, dyspnea, diaphoresis, orthopnea, PND, claudication, edema Respiratory: denies cough, dyspnea, DOE, pleurisy, hoarseness, laryngitis, wheezing.  Gastrointestinal: Denies dysphagia, heartburn, reflux, water brash, pain, cramps, nausea, vomiting, bloating, diarrhea, constipation, hematemesis, melena, hematochezia, jaundice, hemorrhoids Genitourinary: Denies  dysuria, frequency, urgency, nocturia, hesitancy, discharge, hematuria, flank pain Breast: Breast lumps, nipple discharge, bleeding.  Musculoskeletal: Denies arthralgia, myalgia, stiffness, Jt. Swelling, pain, limp, and strain/sprain. Denies falls. Skin: Denies puritis, rash, hives, warts, acne, eczema, changing in skin lesion Neuro: No weakness, tremor, incoordination, spasms, paresthesia, pain Psychiatric: Denies confusion, memory loss, sensory loss. Denies Depression. Endocrine: Denies change in weight, skin, hair change, nocturia, and paresthesia, diabetic polys, visual blurring, hyper / hypo glycemic episodes.  Heme/Lymph: No excessive bleeding, bruising, enlarged lymph nodes.  Physical Exam  BP 136/80  Pulse 89    Temp 97.9 F (36.6 C)    Resp 16    Ht 5' 1.5" (1.562 m)    Wt 157 lb 9.6 oz (71.5 kg)    SpO2 99%    BMI 29.30 kg/m   General Appearance: Over  nourished, well groomed and in no apparent distress.  Eyes: PERRLA, EOMs, conjunctiva no swelling or erythema, normal fundi and vessels. Sinuses: No frontal/maxillary tenderness ENT/Mouth: EACs patent / TMs  nl. Nares clear without erythema, swelling, mucoid exudates. Oral hygiene is good. No erythema, swelling, or exudate. Tongue normal, non-obstructing. Tonsils not swollen or erythematous. Hearing normal.  Neck: Supple, thyroid not palpable. No bruits, nodes or JVD. Respiratory: Respiratory effort normal.  BS equal and clear bilateral without rales, rhonci, wheezing or stridor. Cardio: Heart sounds are normal with regular rate and rhythm and no murmurs, rubs or gallops. Peripheral pulses are normal and equal bilaterally without edema. No aortic or femoral bruits. Chest: symmetric with normal excursions and percussion. Breasts: Symmetric, without lumps, nipple discharge, retractions, or fibrocystic changes.  Abdomen: Flat, soft with bowel sounds active. Nontender, no guarding, rebound, hernias, masses, or organomegaly.   Lymphatics: Non tender without lymphadenopathy.  Genitourinary:  Musculoskeletal: Full ROM all peripheral extremities, joint stability, 5/5 strength, and normal gait. Skin: Warm and dry without rashes, lesions, cyanosis, clubbing or  ecchymosis.  Neuro: Cranial nerves intact, reflexes equal bilaterally. Normal muscle tone, no cerebellar symptoms. Sensation intact.  Pysch: Alert and oriented X 3, normal affect, Insight and Judgment appropriate.    Assessment and Plan  1. Annual Preventative Screening Examination   2. Labile hypertension  - EKG 12-Lead - [place], RETROPERITNL ABD,  LTD - Urinalysis, Routine w reflex microscopic - Microalbumin / creatinine urine ratio - CBC with Differential/Platelet - COMPLETE METABOLIC PANEL WITH GFR - Magnesium - TSH  3. Hyperlipidemia, mixed  - EKG 12-Lead - [place], RETROPERITNL ABD,  LTD - Lipid panel - TSH  4. Abnormal glucose  - EKG 12-Lead - [place], RETROPERITNL ABD,  LTD - Hemoglobin A1c - Insulin, random  5. Vitamin D deficiency  - VITAMIN D 25 Hydroxy   6. Screening for ischemic heart disease  - EKG 12-Lead  7. B12 deficiency  - Vitamin B12 - CBC with Differential/Platelet  8. Screening for colorectal cancer  - POC Hemoccult Bld/Stl   9. Screening examination for pulmonary tuberculosis  - TB Skin Test  10. FHx: heart disease  - EKG 12-Lead  11. Screening for AAA (aortic abdominal aneurysm)  - [place], RETROPERITNL ABD,  LTD  12. Fatigue, unspecified type  - Iron, Total/Total Iron Binding Cap - Vitamin B12 - CBC with Differential/Platelet - TSH  13. Medication management  - Urinalysis, Routine w reflex microscopic - Microalbumin / creatinine urine ratio - CBC with Differential/Platelet - COMPLETE METABOLIC PANEL WITH GFR - Magnesium - Lipid panel - TSH - Hemoglobin A1c - Insulin, random - VITAMIN D 25 Hydroxy         Patient was counseled in prudent diet to achieve/maintain BMI less than 25 for weight  control, BP monitoring, regular exercise and medications. Discussed med's effects and SE's. Screening labs and tests as requested with regular follow-up as recommended. Over 40 minutes of exam, counseling, chart review and high complex critical decision making was performed.   [name], MD

## 2022-02-18 NOTE — Patient Instructions (Signed)
Due to recent changes in healthcare laws, you may see the results of your imaging and laboratory studies on MyChart before your provider has had a chance to review them.  We understand that in some cases there may be results that are confusing or concerning to you. Not all laboratory results come back in the same time frame and the provider may be waiting for multiple results in order to interpret others.  Please give [place] 48 hours in order for your provider to thoroughly review all the results before contacting the office for clarification of your results.   ++++++++++++++++++++++++++++++  Vit D  & Vit C 1,000 mg   are recommended to help protect  against the Covid-19 and other Corona viruses.    Also it's recommended  to take  Zinc 50 mg  to help  protect against the Covid-19   and best place to get  is also on [email or web address]  and don't pay more than 6-8 cents /pill !  ================================ Coronavirus (COVID-19) Are you at risk?  Are you at risk for the Coronavirus (COVID-19)?  To be considered HIGH RISK for Coronavirus (COVID-19), you have to meet the following criteria:  Traveled to [place], [place], [place], [place] or [place]; or in the [place] to [place], [place], [place]  or [place]; and have fever, cough, and shortness of breath within the last 2 weeks of travel OR Been in close contact with a person diagnosed with COVID-19 within the last 2 weeks and have  fever, cough,and shortness of breath  IF YOU DO NOT MEET THESE CRITERIA, YOU ARE CONSIDERED LOW RISK FOR COVID-19.  What to do if you are HIGH RISK for COVID-19?  If you are having a medical emergency, call 911. Seek medical care right away. Before you go to a doctors office, urgent care or emergency department,  call ahead and tell them about your recent travel, contact with someone diagnosed with COVID-19   and your symptoms.  You should receive instructions from your physicians office regarding  next steps of care.  When you arrive at healthcare provider, tell the healthcare staff immediately you have returned from  visiting [place], [place], [place], [place] or [place]; or traveled in the [place] to [place], [place],  [place] or [place] in the last two weeks or you have been in close contact with a person diagnosed with  COVID-19 in the last 2 weeks.   Tell the health care staff about your symptoms: fever, cough and shortness of breath. After you have been seen by a medical provider, you will be either: Tested for (COVID-19) and discharged home on quarantine except to seek medical care if  symptoms worsen, and asked to  Stay home and avoid contact with others until you get your results (4-5 days)  Avoid travel on public transportation if possible (such as bus, train, or airplane) or Sent to the Emergency Department by EMS for evaluation, COVID-19 testing  and  possible admission depending on your condition and test results.  What to do if you are LOW RISK for COVID-19?  Reduce your risk of any infection by using the same precautions used for avoiding the common cold or flu:  Wash your hands often with soap and warm water for at least 20 seconds.  If soap and water are not readily available,  use an alcohol-based hand sanitizer with at least 60% alcohol.  If coughing or sneezing, cover your mouth and nose by  or sneezing into the elbow areas of your shirt or coat,  into a tissue or into your sleeve (not your hands). Avoid shaking hands with others and consider head nods or verbal greetings only. Avoid touching your eyes, nose, or mouth with unwashed hands.  Avoid close contact with people who are sick. Avoid places or events with large numbers of people in one location, like concerts or sporting events. Carefully consider travel plans you have or are making. If you are planning any travel outside or inside the [place], visit the CDC's Travelers' Health webpage for  the latest health notices. If you have some symptoms but not all symptoms, continue to monitor at home and seek medical attention  if your symptoms worsen. If you are having a medical emergency, call 911. >>>>>>>>>>>>>>>>>>>>>>> Preventive Care for Adults  A healthy lifestyle and preventive care can promote health and wellness. Preventive health guidelines for women include the following key practices. A routine yearly physical is a good way to check with your health care provider about your health and preventive screening. It is a chance to share any concerns and updates on your health and to receive a thorough exam. Visit your dentist for a routine exam and preventive care every 6 months. Brush your teeth twice a day and floss once a day. Good oral hygiene prevents tooth decay and gum disease. The frequency of eye exams is based on your age, health, family medical history, use of contact lenses, and other factors. Follow your health care provider's recommendations for frequency of eye exams. Eat a healthy diet. Foods like vegetables, fruits, whole grains, low-fat dairy products, and lean protein foods contain the nutrients you need without too many calories. Decrease your intake of foods high in solid fats, added sugars, and salt. Eat the right amount of calories for you. Get information about a proper diet from your health care provider, if necessary. Regular physical exercise is one of the most important things you can do for your health. Most adults should get at least 150 minutes of moderate-intensity exercise (any activity that increases your heart rate and causes you to sweat) each week. In addition, most adults need muscle-strengthening exercises on 2 or more days a week. Maintain a healthy weight. The body mass index (BMI) is a screening tool to identify possible weight problems. It provides an estimate of body fat based on height and weight. Your health care provider can find your BMI and can  help you achieve or maintain a healthy weight. For adults 20 years and older: A BMI below 18.5 is considered underweight. A BMI of 18.5 to 24.9 is normal. A BMI of 25 to 29.9 is considered overweight. A BMI of 30 and above is considered obese. Maintain normal blood lipids and cholesterol levels by exercising and minimizing your intake of saturated fat. Eat a balanced diet with plenty of fruit and vegetables. Blood tests for lipids and cholesterol should begin at age 20 and be repeated every 5 years. If your lipid or cholesterol levels are high, you are over 50, or you are at high risk for heart disease, you may need your cholesterol levels checked more frequently. Ongoing high lipid and cholesterol levels should be treated with medicines if diet and exercise are not working. If you smoke, find out from your health care provider how to quit. If you do not use tobacco, do not start. Lung cancer screening is recommended for adults aged 55-80 years who are at high risk for developing   developing lung cancer because of a history of smoking. A yearly low-dose CT scan of the lungs is recommended for people who have at least a 30-pack-year history of smoking and are a current smoker or have quit within the past 15 years. A pack year of smoking is smoking an average of 1 pack of cigarettes a day for 1 year (for example: 1 pack a day for 30 years or 2 packs a day for 15 years). Yearly screening should continue until the smoker has stopped smoking for at least 15 years. Yearly screening should be stopped for people who develop a health problem that would prevent them from having lung cancer treatment. High blood pressure causes heart disease and increases the risk of stroke. Your blood pressure should be checked at least every 1 to 2 years. Ongoing high blood pressure should be treated with medicines if weight loss and exercise do not work. If you are 67-36 years old, ask your health care provider if you should take aspirin to  prevent strokes. Diabetes screening involves taking a blood sample to check your fasting blood sugar level. This should be done once every 3 years, after age 84, if you are within normal weight and without risk factors for diabetes. Testing should be considered at a younger age or be carried out more frequently if you are overweight and have at least 1 risk factor for diabetes. Breast cancer screening is essential preventive care for women. You should practice "breast self-awareness." This means understanding the normal appearance and feel of your breasts and may include breast self-examination. Any changes detected, no matter how small, should be reported to a health care provider. Women in their 67s and 30s should have a clinical breast exam (CBE) by a health care provider as part of a regular health exam every 1 to 3 years. After age 53, women should have a CBE every year. Starting at age 68, women should consider having a mammogram (breast X-ray test) every year. Women who have a family history of breast cancer should talk to their health care provider about genetic screening. Women at a high risk of breast cancer should talk to their health care providers about having an MRI and a mammogram every year. Breast cancer gene (BRCA)-related cancer risk assessment is recommended for women who have family members with BRCA-related cancers. BRCA-related cancers include breast, ovarian, tubal, and peritoneal cancers. Having family members with these cancers may be associated with an increased risk for harmful changes (mutations) in the breast cancer genes BRCA1 and BRCA2. Results of the assessment will determine the need for genetic counseling and BRCA1 and BRCA2 testing. Routine pelvic exams to screen for cancer are no longer recommended for nonpregnant women who are considered low risk for cancer of the pelvic organs (ovaries, uterus, and vagina) and who do not have symptoms. Ask your health care provider if a  screening pelvic exam is right for you. If you have had past treatment for cervical cancer or a condition that could lead to cancer, you need Pap tests and screening for cancer for at least 20 years after your treatment. If Pap tests have been discontinued, your risk factors (such as having a new sexual partner) need to be reassessed to determine if screening should be resumed. Some women have medical problems that increase the chance of getting cervical cancer. In these cases, your health care provider may recommend more frequent screening and Pap tests. Colorectal cancer can be detected and often prevented. Most routine  colorectal cancer screening begins at the age of 48 years and continues through age 29 years. However, your health care provider may recommend screening at an earlier age if you have risk factors for colon cancer. On a yearly basis, your health care provider may provide home test kits to check for hidden blood in the stool. Use of a small camera at the end of a tube, to directly examine the colon (sigmoidoscopy or colonoscopy), can detect the earliest forms of colorectal cancer. Talk to your health care provider about this at age 12, when routine screening begins.  Direct exam of the colon should be repeated every 5-10 years through age 26 years, unless early forms of pre-cancerous polyps or small growths are found. Hepatitis C blood testing is recommended for all people born from [birth year] through 1965 and any individual with known risks for hepatitis C.  Osteoporosis is a disease in which the bones lose minerals and strength with aging. This can result in serious bone fractures or breaks. The risk of osteoporosis can be identified using a bone density scan. Women ages 30 years and over and women at risk for fractures or osteoporosis should discuss screening with their health care providers. Ask your health care provider whether you should take a calcium supplement or vitamin D to reduce the rate  of osteoporosis. Menopause can be associated with physical symptoms and risks. Hormone replacement therapy is available to decrease symptoms and risks. You should talk to your health care provider about whether hormone replacement therapy is right for you. Use sunscreen. Apply sunscreen liberally and repeatedly throughout the day. You should seek shade when your shadow is shorter than you. Protect yourself by wearing long sleeves, pants, a wide-brimmed hat, and sunglasses year round, whenever you are outdoors. Once a month, do a whole body skin exam, using a mirror to look at the skin on your back. Tell your health care provider of new moles, moles that have irregular borders, moles that are larger than a pencil eraser, or moles that have changed in shape or color. Stay current with required vaccines (immunizations). Influenza vaccine. All adults should be immunized every year. Tetanus, diphtheria, and acellular pertussis (Td, Tdap) vaccine. Pregnant women should receive 1 dose of Tdap vaccine during each pregnancy. The dose should be obtained regardless of the length of time since the last dose. Immunization is preferred during the 27th-36th week of gestation. An adult who has not previously received Tdap or who does not know her vaccine status should receive 1 dose of Tdap. This initial dose should be followed by tetanus and diphtheria toxoids (Td) booster doses every 10 years. Adults with an unknown or incomplete history of completing a 3-dose immunization series with Td-containing vaccines should begin or complete a primary immunization series including a Tdap dose. Adults should receive a Td booster every 10 years. Varicella vaccine. An adult without evidence of immunity to varicella should receive 2 doses or a second dose if she has previously received 1 dose. Pregnant females who do not have evidence of immunity should receive the first dose after pregnancy. This first dose should be obtained before  leaving the health care facility. The second dose should be obtained 4-8 weeks after the first dose. Human papillomavirus (HPV) vaccine. Females aged 13-26 years who have not received the vaccine previously should obtain the 3-dose series. The vaccine is not recommended for use in pregnant females. However, pregnancy testing is not needed before receiving a dose. If a female is found  be pregnant after receiving a dose, no treatment is needed. In that case, the remaining doses should be delayed until after the pregnancy. Immunization is recommended for any person with an immunocompromised condition through the age of 26 years if she did not get any or all doses earlier. During the 3-dose series, the second dose should be obtained 4-8 weeks after the first dose. The third dose should be obtained 24 weeks after the first dose and 16 weeks after the second dose. Zoster vaccine. One dose is recommended for adults aged 60 years or older unless certain conditions are present. Measles, mumps, and rubella (MMR) vaccine. Adults born before 1957 generally are considered immune to measles and mumps. Adults born in 1957 or later should have 1 or more doses of MMR vaccine unless there is a contraindication to the vaccine or there is laboratory evidence of immunity to each of the three diseases. A routine second dose of MMR vaccine should be obtained at least 28 days after the first dose for students attending postsecondary schools, health care workers, or international travelers. People who received inactivated measles vaccine or an unknown type of measles vaccine during 1963-1967 should receive 2 doses of MMR vaccine. People who received inactivated mumps vaccine or an unknown type of mumps vaccine before 1979 and are at high risk for mumps infection should consider immunization with 2 doses of MMR vaccine. For females of childbearing age, rubella immunity should be determined. If there is no evidence of immunity, females  who are not pregnant should be vaccinated. If there is no evidence of immunity, females who are pregnant should delay immunization until after pregnancy. Unvaccinated health care workers born before 1957 who lack laboratory evidence of measles, mumps, or rubella immunity or laboratory confirmation of disease should consider measles and mumps immunization with 2 doses of MMR vaccine or rubella immunization with 1 dose of MMR vaccine. Pneumococcal 13-valent conjugate (PCV13) vaccine. When indicated, a person who is uncertain of her immunization history and has no record of immunization should receive the PCV13 vaccine. An adult aged 19 years or older who has certain medical conditions and has not been previously immunized should receive 1 dose of PCV13 vaccine. This PCV13 should be followed with a dose of pneumococcal polysaccharide (PPSV23) vaccine. The PPSV23 vaccine dose should be obtained at least 1 or more year(s) after the dose of PCV13 vaccine. An adult aged 19 years or older who has certain medical conditions and previously received 1 or more doses of PPSV23 vaccine should receive 1 dose of PCV13. The PCV13 vaccine dose should be obtained 1 or more years after the last PPSV23 vaccine dose.  Pneumococcal polysaccharide (PPSV23) vaccine. When PCV13 is also indicated, PCV13 should be obtained first. All adults aged 65 years and older should be immunized. An adult younger than age 65 years who has certain medical conditions should be immunized. Any person who resides in a nursing home or long-term care facility should be immunized. An adult smoker should be immunized. People with an immunocompromised condition and certain other conditions should receive both PCV13 and PPSV23 vaccines. People with human immunodeficiency virus (HIV) infection should be immunized as soon as possible after diagnosis. Immunization during chemotherapy or radiation therapy should be avoided. Routine use of PPSV23 vaccine is not  recommended for American Indians, [place] Natives, or people younger than 65 years unless there are medical conditions that require PPSV23 vaccine. When indicated, people who have unknown immunization and have no record of immunization should receive   PPSV23 vaccine. One-time revaccination 5 years after the first dose of PPSV23 is recommended for people aged 19-64 years who have chronic kidney failure, nephrotic syndrome, asplenia, or immunocompromised conditions. People who received 1-2 doses of PPSV23 before age 65 years should receive another dose of PPSV23 vaccine at age 65 years or later if at least 5 years have passed since the previous dose. Doses of PPSV23 are not needed for people immunized with PPSV23 at or after age 65 years.  Preventive Services / Frequency  Ages 40 to 64 years Blood pressure check. Lipid and cholesterol check. Lung cancer screening. / Every year if you are aged 55-80 years and have a 30-pack-year history of smoking and currently smoke or have quit within the past 15 years. Yearly screening is stopped once you have quit smoking for at least 15 years or develop a health problem that would prevent you from having lung cancer treatment. Clinical breast exam.** / Every year after age 40 years.  BRCA-related cancer risk assessment.** / For women who have family members with a BRCA-related cancer (breast, ovarian, tubal, or peritoneal cancers). Mammogram.** / Every year beginning at age 40 years and continuing for as long as you are in good health. Consult with your health care provider. Pap test.** / Every 3 years starting at age 30 years through age 65 or 70 years with a history of 3 consecutive normal Pap tests. HPV screening.** / Every 3 years from ages 30 years through ages 65 to 70 years with a history of 3 consecutive normal Pap tests. Fecal occult blood test (FOBT) of stool. / Every year beginning at age 50 years and continuing until age 75 years. You may not need to do this  test if you get a colonoscopy every 10 years. Flexible sigmoidoscopy or colonoscopy.** / Every 5 years for a flexible sigmoidoscopy or every 10 years for a colonoscopy beginning at age 50 years and continuing until age 75 years. Hepatitis C blood test.** / For all people born from 1945 through 1965 and any individual with known risks for hepatitis C. Skin self-exam. / Monthly. Influenza vaccine. / Every year. Tetanus, diphtheria, and acellular pertussis (Tdap/Td) vaccine.** / Consult your health care provider. Pregnant women should receive 1 dose of Tdap vaccine during each pregnancy. 1 dose of Td every 10 years. Varicella vaccine.** / Consult your health care provider. Pregnant females who do not have evidence of immunity should receive the first dose after pregnancy. Zoster vaccine.** / 1 dose for adults aged 60 years or older. Pneumococcal 13-valent conjugate (PCV13) vaccine.** / Consult your health care provider. Pneumococcal polysaccharide (PPSV23) vaccine.** / 1 to 2 doses if you smoke cigarettes or if you have certain conditions. Meningococcal vaccine.** / Consult your health care provider. Hepatitis A vaccine.** / Consult your health care provider. Hepatitis B vaccine.** / Consult your health care provider. Screening for abdominal aortic aneurysm (AAA)  by ultrasound is recommended for people over 50 who have history of high blood pressure or who are current or former smokers. ++++++++++++++++++ Recommend Adult Low Dose Aspirin or  coated  Aspirin 81 mg daily  To reduce risk of Colon Cancer 40 %,  Skin Cancer 26 % ,  Melanoma 46%  and  Pancreatic cancer 60% +++++++++++++++++++ Vitamin D goal  is between 70-100.  Please make sure that you are taking your Vitamin D as directed.  It is very important as a natural anti-inflammatory  helping hair, skin, and nails, as well as reducing stroke and heart   attack risk.  It helps your bones and helps with mood. It also decreases numerous  cancer risks so please take it as directed.  Low Vit D is associated with a 200-300% higher risk for CANCER  and 200-300% higher risk for HEART   ATTACK  &  STROKE.   ...................................... It is also associated with higher death rate at younger ages,  autoimmune diseases like Rheumatoid arthritis, Lupus, Multiple Sclerosis.    Also many other serious conditions, like depression, Alzheimer's Dementia, infertility, muscle aches, fatigue, fibromyalgia - just to name a few. ++++++++++++++++++ Recommend the book "The END of DIETING" by Dr Joel Fuhrman  & the book "The END of DIABETES " by Dr Joel Fuhrman At [employer] - get book & Audio CD's    Being diabetic has a  300% increased risk for heart attack, stroke, cancer, and alzheimer- type vascular dementia. It is very important that you work harder with diet by avoiding all foods that are white. Avoid white rice (brown & wild rice is OK), white potatoes (sweetpotatoes in moderation is OK), White bread or wheat bread or anything made out of white flour like bagels, donuts, rolls, buns, biscuits, cakes, pastries, cookies, pizza crust, and pasta (made from white flour & egg whites) - vegetarian pasta or spinach or wheat pasta is OK. Multigrain breads like Arnold's or [employer], or multigrain sandwich thins or flatbreads.  Diet, exercise and weight loss can reverse and cure diabetes in the early stages.  Diet, exercise and weight loss is very important in the control and prevention of complications of diabetes which affects every system in your body, ie. Brain - dementia/stroke, eyes - glaucoma/blindness, heart - heart attack/heart failure, kidneys - dialysis, stomach - gastric paralysis, intestines - malabsorption, nerves - severe painful neuritis, circulation - gangrene & loss of a leg(s), and finally cancer and Alzheimers.    I recommend avoid fried & greasy foods,  sweets/candy, white rice (brown or wild rice or Quinoa is OK), white  potatoes (sweet potatoes are OK) - anything made from white flour - bagels, doughnuts, rolls, buns, biscuits,white and wheat breads, pizza crust and traditional pasta made of white flour & egg white(vegetarian pasta or spinach or wheat pasta is OK).  Multi-grain bread is OK - like multi-grain flat bread or sandwich thins. Avoid alcohol in excess. Exercise is also important.    Eat all the vegetables you want - avoid meat, especially red meat and dairy - especially cheese.  Cheese is the most concentrated form of trans-fats which is the worst thing to clog up our arteries. Veggie cheese is OK which can be found in the fresh produce section at [employer] or [employer] or [employer]  ++++++++++++++++++++++ DASH Eating Plan  DASH stands for "Dietary Approaches to Stop Hypertension."   The DASH eating plan is a healthy eating plan that has been shown to reduce high blood pressure (hypertension). Additional health benefits may include reducing the risk of type 2 diabetes mellitus, heart disease, and stroke. The DASH eating plan may also help with weight loss. WHAT DO I NEED TO KNOW ABOUT THE DASH EATING PLAN? For the DASH eating plan, you will follow these general guidelines: Choose foods with a percent daily value for sodium of less than 5% (as listed on the food label). Use salt-free seasonings or herbs instead of table salt or sea salt. Check with your health care provider or pharmacist before using salt substitutes. Eat lower-sodium products, often labeled as "lower sodium" or "no   salt added." Eat fresh foods. Eat more vegetables, fruits, and low-fat dairy products. Choose whole grains. Look for the word "whole" as the first word in the ingredient list. Choose fish  Limit sweets, desserts, sugars, and sugary drinks. Choose heart-healthy fats. Eat veggie cheese  Eat more home-cooked food and less restaurant, buffet, and fast food. Limit fried foods. Cook foods using methods other than  frying. Limit canned vegetables. If you do use them, rinse them well to decrease the sodium. When eating at a restaurant, ask that your food be prepared with less salt, or no salt if possible.                      WHAT FOODS CAN I EAT? Read Dr Joel Fuhrman's books on The End of Dieting & The End of Diabetes  Grains Whole grain or whole wheat bread. Brown rice. Whole grain or whole wheat pasta. Quinoa, bulgur, and whole grain cereals. Low-sodium cereals. Corn or whole wheat flour tortillas. Whole grain cornbread. Whole grain crackers. Low-sodium crackers.  Vegetables Fresh or frozen vegetables (raw, steamed, roasted, or grilled). Low-sodium or reduced-sodium tomato and vegetable juices. Low-sodium or reduced-sodium tomato sauce and paste. Low-sodium or reduced-sodium canned vegetables.   Fruits All fresh, canned (in natural juice), or frozen fruits.  Protein Products  All fish and seafood.  Dried beans, peas, or lentils. Unsalted nuts and seeds. Unsalted canned beans.  Dairy Low-fat dairy products, such as skim or 1% milk, 2% or reduced-fat cheeses, low-fat ricotta or cottage cheese, or plain low-fat yogurt. Low-sodium or reduced-sodium cheeses.  Fats and Oils Tub margarines without trans fats. Light or reduced-fat mayonnaise and salad dressings (reduced sodium). Avocado. Safflower, olive, or canola oils. Natural peanut or almond butter.  Other Unsalted popcorn and pretzels. The items listed above may not be a complete list of recommended foods or beverages. Contact your dietitian for more options.  ++++++++++++++++++  WHAT FOODS ARE NOT RECOMMENDED? Grains/ White flour or wheat flour White bread. White pasta. White rice. Refined cornbread. Bagels and croissants. Crackers that contain trans fat.  Vegetables  Creamed or fried vegetables. Vegetables in a . Regular canned vegetables. Regular canned tomato sauce and paste. Regular tomato and vegetable juices.  Fruits Dried fruits.  Canned fruit in light or heavy syrup. Fruit juice.  Meat and Other Protein Products Meat in general - RED meat & White meat.  Fatty cuts of meat. Ribs, chicken wings, all processed meats as bacon, sausage, bologna, salami, fatback, hot dogs, bratwurst and packaged luncheon meats.  Dairy Whole or 2% milk, cream, half-and-half, and cream cheese. Whole-fat or sweetened yogurt. Full-fat cheeses or blue cheese. Non-dairy creamers and whipped toppings. Processed cheese, cheese spreads, or cheese curds.  Condiments Onion and garlic salt, seasoned salt, table salt, and sea salt. Canned and packaged gravies. Worcestershire sauce. Tartar sauce. Barbecue sauce. Teriyaki sauce. Soy sauce, including reduced sodium. Steak sauce. Fish sauce. Oyster sauce. Cocktail sauce. Horseradish. Ketchup and mustard. Meat flavorings and tenderizers. Bouillon cubes. Hot sauce. Tabasco sauce. Marinades. Taco seasonings. Relishes.  Fats and Oils Butter, stick margarine, lard, shortening and bacon fat. Coconut, palm kernel, or palm oils. Regular salad dressings.  Pickles and olives. Salted popcorn and pretzels.  The items listed above may not be a complete list of foods and beverages to avoid.   

## 2022-02-19 ENCOUNTER — Other Ambulatory Visit: Payer: Self-pay | Admitting: Internal Medicine

## 2022-02-19 DIAGNOSIS — D509 Iron deficiency anemia, unspecified: Secondary | ICD-10-CM

## 2022-02-19 LAB — CBC WITH DIFFERENTIAL/PLATELET
Absolute Monocytes: 520 cells/uL (ref 200–950)
Basophils Absolute: 21 cells/uL (ref 0–200)
Basophils Relative: 0.4 %
Eosinophils Absolute: 182 cells/uL (ref 15–500)
Eosinophils Relative: 3.5 %
HCT: 37 % (ref 35.0–45.0)
Hemoglobin: 11.3 g/dL — ABNORMAL LOW (ref 11.7–15.5)
Lymphs Abs: 764 cells/uL — ABNORMAL LOW (ref 850–3900)
MCH: 23.4 pg — ABNORMAL LOW (ref 27.0–33.0)
MCHC: 30.5 g/dL — ABNORMAL LOW (ref 32.0–36.0)
MCV: 76.8 fL — ABNORMAL LOW (ref 80.0–100.0)
MPV: 10.6 fL (ref 7.5–12.5)
Monocytes Relative: 10 %
Neutro Abs: 3713 cells/uL (ref 1500–7800)
Neutrophils Relative %: 71.4 %
Platelets: 283 10*3/uL (ref 140–400)
RBC: 4.82 10*6/uL (ref 3.80–5.10)
RDW: 16.5 % — ABNORMAL HIGH (ref 11.0–15.0)
Total Lymphocyte: 14.7 %
WBC: 5.2 10*3/uL (ref 3.8–10.8)

## 2022-02-19 LAB — COMPLETE METABOLIC PANEL WITH GFR
AG Ratio: 1.6 (calc) (ref 1.0–2.5)
ALT: 20 U/L (ref 6–29)
AST: 24 U/L (ref 10–35)
Albumin: 4.2 g/dL (ref 3.6–5.1)
Alkaline phosphatase (APISO): 72 U/L (ref 37–153)
BUN: 14 mg/dL (ref 7–25)
CO2: 26 mmol/L (ref 20–32)
Calcium: 9.2 mg/dL (ref 8.6–10.4)
Chloride: 106 mmol/L (ref 98–110)
Creat: 0.74 mg/dL (ref 0.50–1.05)
Globulin: 2.6 g/dL (calc) (ref 1.9–3.7)
Glucose, Bld: 88 mg/dL (ref 65–99)
Potassium: 4 mmol/L (ref 3.5–5.3)
Sodium: 141 mmol/L (ref 135–146)
Total Bilirubin: 0.4 mg/dL (ref 0.2–1.2)
Total Protein: 6.8 g/dL (ref 6.1–8.1)
eGFR: 92 mL/min/{1.73_m2} (ref >=60)

## 2022-02-19 LAB — URINALYSIS, ROUTINE W REFLEX MICROSCOPIC
Bilirubin Urine: NEGATIVE
Hgb urine dipstick: NEGATIVE
Ketones, ur: NEGATIVE
Leukocytes,Ua: NEGATIVE
Nitrite: NEGATIVE
Protein, ur: NEGATIVE
Specific Gravity, Urine: 1.03 (ref 1.001–1.035)
pH: 5.5 (ref 5.0–8.0)

## 2022-02-19 LAB — VITAMIN B12: Vitamin B-12: >2000 pg/mL — ABNORMAL HIGH (ref 200–1100)

## 2022-02-19 LAB — MICROALBUMIN / CREATININE URINE RATIO
Creatinine, Urine: 98 mg/dL (ref 20–275)
Microalb Creat Ratio: 7 mcg/mg creat (ref <30)
Microalb, Ur: 0.7 mg/dL

## 2022-02-19 LAB — INSULIN, RANDOM: Insulin: 10.7 u[IU]/mL

## 2022-02-19 LAB — LIPID PANEL
Cholesterol: 164 mg/dL (ref <200)
HDL: 71 mg/dL (ref >=50)
LDL Cholesterol (Calc): 80 mg/dL (calc)
Non-HDL Cholesterol (Calc): 93 mg/dL (calc) (ref <130)
Total CHOL/HDL Ratio: 2.3 (calc) (ref <5.0)
Triglycerides: 54 mg/dL (ref <150)

## 2022-02-19 LAB — IRON, TOTAL/TOTAL IRON BINDING CAP
%SAT: 5 % (calc) — ABNORMAL LOW (ref 16–45)
Iron: 25 ug/dL — ABNORMAL LOW (ref 45–160)
TIBC: 463 mcg/dL (calc) — ABNORMAL HIGH (ref 250–450)

## 2022-02-19 LAB — VITAMIN D 25 HYDROXY (VIT D DEFICIENCY, FRACTURES): Vit D, 25-Hydroxy: 118 ng/mL — ABNORMAL HIGH (ref 30–100)

## 2022-02-19 LAB — HEMOGLOBIN A1C
Hgb A1c MFr Bld: 5.2 % of total Hgb (ref <5.7)
Mean Plasma Glucose: 103 mg/dL
eAG (mmol/L): 5.7 mmol/L

## 2022-02-19 LAB — MAGNESIUM: Magnesium: 2.5 mg/dL (ref 1.5–2.5)

## 2022-02-19 LAB — TSH: TSH: 2.03 mIU/L (ref 0.40–4.50)

## 2022-02-19 NOTE — Progress Notes (Signed)
<><><><><><><><><><><><><><><><><><><><><><><><><><><><><><><><><> ?<><><><><><><><><><><><><><><><><><><><><><><><><><><><><><><><><> ?-   Test results slightly outside the reference range are not unusual. ?If there is anything important, I will review this with you,  ?otherwise it is considered normal test values.  ?If you have further questions,  ?please do not hesitate to contact me at the office or via My Chart.  ?<><><><><><><><><><><><><><><><><><><><><><><><><><><><><><><><><> ?<><><><><><><><><><><><><><><><><><><><><><><><><><><><><><><><><> ? ?-  Iron is VERY LOW, so suggest you increase your daily Iron up to 3 x/day with Meals  !  ?<><><><><><><><><><><><><><><><><><><><><><><><><><><><><><><><><> ?<><><><><><><><><><><><><><><><><><><><><><><><><><><><><><><><><> ? ?- Vitamin B12 is very high, so suggest decrease B12 tabs to only 1  x /week  ? ?<><><><><><><><><><><><><><><><><><><><><><><><><><><><><><><><><> ?<><><><><><><><><><><><><><><><><><><><><><><><><><><><><><><><><> ? ?- CBC shows Iron deficiency Anemia, with low MCV  and  ? ?                                      Hgb ( Red Cell Count) has dropped from 14.2 down to 11.3  !  ? ?- So, it's very Important that you take the Iron 3 x /day ! ? ?- Then please call office to schedule a lab visit in about 6 weeks  ?                                                                   to recheck your Iron level & repeat your CBC  ? ?<><><><><><><><><><><><><><><><><><><><><><><><><><><><><><><><><> ?<><><><><><><><><><><><><><><><><><><><><><><><><><><><><><><><><> ? ?- Vitamin D =   118    is slightly elevated,  ( Ideal or goal is between 70-100 ) - So ? ?- Recommend decrease dose of the 50,000 unit capsules to every other day   ?                                                                           ( for example even days of the month )   ?<><><><><><><><><><><><><><><><><><><><><><><><><><><><><><><><><> ?<><><><><><><><><><><><><><><><><><><><><><><><><><><><><><><><><> ? ?- Total Chol = 164    &  LDL Chol = 80   - Both  Excellent  ? ?- Very low risk for Heart Attack  / Stroke ?<><><><><><><><><><><><><><><><><><><><><><><><><><><><><><><><><> ?<><><><><><><><><><><><><><><><><><><><><><><><><><><><><><><><><> ? ?-  A1c - Normal - no Diabetes   - Great  ! ?<><><><><><><><><><><><><><><><><><><><><><><><><><><><><><><><><> ?<><><><><><><><><><><><><><><><><><><><><><><><><><><><><><><><><> ? ?- All Else - CBC - Kidneys - Electrolytes - Liver - Magnesium & Thyroid   ? ?- all  Normal / OK ?<><><><><><><><><><><><><><><><><><><><><><><><><><><><><><><><><> ?<><><><><><><><><><><><><><><><><><><><><><><><><><><><><><><><><> ? ?- Keep up the [place] Work  !  ? ?<><><><><><><><><><><><><><><><><><><><><><><><><><><><><><><><><> ?<><><><><><><><><><><><><><><><><><><><><><><><><><><><><><><><><> ? ? ? ? ? ?-  ? ? ? ? ? ? ? ? ? ? ? ? ? ? ? ? ? ? ? ? ? ? ? ? ? ? ? ? ? ? ? ? ?

## 2022-07-18 ENCOUNTER — Encounter (HOSPITAL_COMMUNITY): Payer: Self-pay | Admitting: Emergency Medicine

## 2022-07-18 ENCOUNTER — Emergency Department (HOSPITAL_COMMUNITY): Payer: BC Managed Care – PPO

## 2022-07-18 ENCOUNTER — Emergency Department (HOSPITAL_COMMUNITY)
Admission: EM | Admit: 2022-07-18 | Discharge: 2022-07-18 | Disposition: A | Discharge status: Home / Self Care | Payer: BC Managed Care – PPO | Attending: Emergency Medicine | Admitting: Emergency Medicine

## 2022-07-18 ENCOUNTER — Other Ambulatory Visit: Payer: Self-pay

## 2022-07-18 DIAGNOSIS — Z7982 Long term (current) use of aspirin: Secondary | ICD-10-CM | POA: Insufficient documentation

## 2022-07-18 DIAGNOSIS — R258 Other abnormal involuntary movements: Secondary | ICD-10-CM | POA: Insufficient documentation

## 2022-07-18 DIAGNOSIS — R0902 Hypoxemia: Secondary | ICD-10-CM | POA: Diagnosis not present

## 2022-07-18 DIAGNOSIS — Z91040 Latex allergy status: Secondary | ICD-10-CM | POA: Diagnosis not present

## 2022-07-18 DIAGNOSIS — R569 Unspecified convulsions: Secondary | ICD-10-CM | POA: Diagnosis not present

## 2022-07-18 DIAGNOSIS — T679XXA Effect of heat and light, unspecified, initial encounter: Secondary | ICD-10-CM | POA: Diagnosis not present

## 2022-07-18 DIAGNOSIS — I10 Essential (primary) hypertension: Secondary | ICD-10-CM | POA: Diagnosis not present

## 2022-07-18 LAB — CBC WITH DIFFERENTIAL/PLATELET
Abs Immature Granulocytes: 0.01 10*3/uL (ref 0.00–0.07)
Basophils Absolute: 0 10*3/uL (ref 0.0–0.1)
Basophils Relative: 0 %
Eosinophils Absolute: 0.3 10*3/uL (ref 0.0–0.5)
Eosinophils Relative: 4 %
HCT: 39.6 % (ref 36.0–46.0)
Hemoglobin: 12.3 g/dL (ref 12.0–15.0)
Immature Granulocytes: 0 %
Lymphocytes Relative: 10 %
Lymphs Abs: 0.6 10*3/uL — ABNORMAL LOW (ref 0.7–4.0)
MCH: 26 pg (ref 26.0–34.0)
MCHC: 31.1 g/dL (ref 30.0–36.0)
MCV: 83.7 fL (ref 80.0–100.0)
Monocytes Absolute: 0.6 10*3/uL (ref 0.1–1.0)
Monocytes Relative: 10 %
Neutro Abs: 4.7 10*3/uL (ref 1.7–7.7)
Neutrophils Relative %: 76 %
Platelets: 241 10*3/uL (ref 150–400)
RBC: 4.73 MIL/uL (ref 3.87–5.11)
RDW: 14.8 % (ref 11.5–15.5)
WBC: 6.2 10*3/uL (ref 4.0–10.5)
nRBC: 0 % (ref 0.0–0.2)

## 2022-07-18 LAB — COMPREHENSIVE METABOLIC PANEL
ALT: 21 U/L (ref 0–44)
AST: 28 U/L (ref 15–41)
Albumin: 4 g/dL (ref 3.5–5.0)
Alkaline Phosphatase: 86 U/L (ref 38–126)
Anion gap: 6 (ref 5–15)
BUN: 16 mg/dL (ref 8–23)
CO2: 26 mmol/L (ref 22–32)
Calcium: 9 mg/dL (ref 8.9–10.3)
Chloride: 105 mmol/L (ref 98–111)
Creatinine, Ser: 0.72 mg/dL (ref 0.44–1.00)
GFR, Estimated: >60 mL/min (ref >60)
Glucose, Bld: 101 mg/dL — ABNORMAL HIGH (ref 70–99)
Potassium: 4.7 mmol/L (ref 3.5–5.1)
Sodium: 137 mmol/L (ref 135–145)
Total Bilirubin: 0.7 mg/dL (ref 0.3–1.2)
Total Protein: 6.9 g/dL (ref 6.5–8.1)

## 2022-07-18 MED ORDER — LEVETIRACETAM 500 MG PO TABS: 500.0000 mg | ORAL_TABLET | Freq: Two times a day (BID) | ORAL | 0 refills | Status: DC

## 2022-07-18 MED ORDER — SODIUM CHLORIDE 0.9 % IV BOLUS
1000.0000 mL | Freq: Once | INTRAVENOUS | Status: AC
  Administered 2022-07-18: 1000 mL via INTRAVENOUS

## 2022-07-18 MED ORDER — LEVETIRACETAM IN NACL 1000 MG/100ML IV SOLN
1000.0000 mg | Freq: Once | INTRAVENOUS | Status: AC
  Administered 2022-07-18: 1000 mg via INTRAVENOUS
  Filled 2022-07-18: qty 100

## 2022-07-18 NOTE — ED Notes (Signed)
Patient transported to CT 

## 2022-07-18 NOTE — ED Provider Notes (Signed)
[facility] EMERGENCY DEPARTMENT Provider Note   [name]: [id number] Arrival date & time: 07/18/22  1734     History  Chief Complaint  Patient presents with   Seizures    Emily Graham is a 61 y.o. female here presenting with seizure.  Patient states that she felt an aura and felt like she was going to have a seizure during church.  EMS was called and she apparently had 3 episodes of tonic-clonic seizure-like activity.  Most of these episodes lasted less than a minute. Patient has not had seizures for years and has not been on any seizure meds.  She states that she was previously on 4 different seizure meds.  She used to follow-up with [facility] neurology but has not seen them for years  The history is provided by the patient.       Home Medications Prior to Admission medications   Medication Sig Start Date End Date Taking? Authorizing Provider  albuterol (VENTOLIN HFA) 108 (90 Base) MCG/ACT inhaler Inhale 1-2 puffs into the lungs every 6 (six) hours as needed for wheezing or shortness of breath. 02/18/22   Unk Pinto, MD  Arginine 500 MG CAPS Take 500 mg by mouth daily.    [provider]  aspirin 81 MG tablet Take 81 mg by mouth daily.    [provider]  b complex vitamins tablet Take 1 tablet by mouth daily.    [provider]  Cyanocobalamin (B-12) 5000 MCG SUBL Place under the tongue.    [provider]  IRON, FERROUS SULFATE, PO Take 65 mg by mouth daily.    [provider]  loratadine (CLARITIN) 10 MG tablet Take 10 mg by mouth daily.    [provider]  milk thistle 175 MG tablet Take 175 mg by mouth daily.    [provider]  Multiple Vitamin (MULTIVITAMIN) tablet Take 1 tablet by mouth daily.     [provider]  NEURONTIN 600 MG tablet Take by mouth. 02/05/22   [provider]  NON Johny Blamer, omega complex    [provider]  NON FORMULARY Bone Nutrient     [provider]  polycarbophil (FIBERCON) 625 MG tablet Take 625 mg by mouth daily.    [provider]  Probiotic CAPS Take 1 capsule by mouth daily.    [provider]  TURMERIC PO Take 1,500 mg by mouth 2 (two) times daily.    [provider]  Vitamin D, Ergocalciferol, (DRISDOL) 50000 units CAPS capsule Take 1 capsule (50,000 Units total) by mouth daily. 06/23/18   Unk Pinto, MD      Allergies    Latex    Review of Systems   Review of Systems  Neurological:  Positive for seizures.  All other systems reviewed and are negative.   Physical Exam Updated Vital Signs BP 137/77   Pulse 78   Temp 97.7 F (36.5 C) (Oral)   Resp 17   Ht 5' 2"  (1.575 m)   Wt 71.5 kg   SpO2 99%   BMI 28.83 kg/m  Physical Exam Vitals and nursing note reviewed.  Constitutional:      Appearance: Normal appearance.  HENT:     Head: Normocephalic.     Nose: Nose normal.     Mouth/Throat:     Mouth: Mucous membranes are moist.     Comments: No obvious tongue laceration Eyes:     Extraocular Movements: Extraocular movements intact.  Pupils: Pupils are equal, round, and reactive to light.  Cardiovascular:     Rate and Rhythm: Normal rate and regular rhythm.     Pulses: Normal pulses.     Heart sounds: Normal heart sounds.  Pulmonary:     Effort: Pulmonary effort is normal.     Breath sounds: Normal breath sounds.  Abdominal:     General: Abdomen is flat.     Palpations: Abdomen is soft.  Musculoskeletal:        General: Normal range of motion.     Cervical back: Normal range of motion and neck supple.  Skin:    General: Skin is warm.     Capillary Refill: Capillary refill takes less than 2 seconds.  Neurological:     General: No focal deficit present.     Mental Status: She is alert and oriented to person, place, and time.     Comments: No obvious eye deviation.  Patient has normal strength and sensation bilateral arms and leg  Psychiatric:         Mood and Affect: Mood normal.        Behavior: Behavior normal.     ED Results / Procedures / Treatments   Labs (all labs ordered are listed, but only abnormal results are displayed) Labs Reviewed  CBC WITH DIFFERENTIAL/PLATELET - Abnormal; Notable for the following components:      Result Value   Lymphs Abs 0.6 (*)    All other components within normal limits  COMPREHENSIVE METABOLIC PANEL - Abnormal; Notable for the following components:   Glucose, Bld 101 (*)    All other components within normal limits    EKG EKG Interpretation  Date/Time:  Saturday July 18 2022 17:45:40 EDT Ventricular Rate:  83 PR Interval:  145 QRS Duration: 94 QT Interval:  400 QTC Calculation: 470 R Axis:   141 Text Interpretation: Right and left arm electrode reversal, interpretation assumes no reversal Sinus rhythm Probable left atrial enlargement Probable lateral infarct, age indeterminate No significant change since last tracing Confirmed by Wandra Arthurs [phone]) on 07/18/2022 6:16:39 PM  Radiology CT HEAD WO CONTRAST (5MM)  Result Date: 07/18/2022 CLINICAL DATA:  Seizure, new-onset, no history of trauma EXAM: CT HEAD WITHOUT CONTRAST TECHNIQUE: Contiguous axial images were obtained from the base of the skull through the vertex without intravenous contrast. RADIATION DOSE REDUCTION: This exam was performed according to the departmental dose-optimization program which includes automated exposure control, adjustment of the mA and/or kV according to patient size and/or use of iterative reconstruction technique. COMPARISON:  MRI 01/08/2010 FINDINGS: Brain: No acute intracranial abnormality. Specifically, no hemorrhage, hydrocephalus, mass lesion, acute infarction, or significant intracranial injury. Vascular: No hyperdense vessel or unexpected calcification. Skull: No acute calvarial abnormality. Sinuses/Orbits: No acute findings. 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 Other: None IMPRESSION: No acute intracranial abnormality. Electronically Signed   By: Rolm Baptise M.D.   On: 07/18/2022 18:52    Procedures Procedures    Medications Ordered in ED Medications  sodium chloride 0.9 % bolus 1,000 mL (0 mLs Intravenous Stopped 07/18/22 1930)  levETIRAcetam (KEPPRA) IVPB 1000 mg/100 mL premix (0 mg Intravenous Stopped 07/18/22 1845)    ED Course/ Medical Decision Making/ A&P                           Medical Decision Making Maryfrances Portugal is a 61 y.o. female  history of seizures here presenting with 3 seizures.  Patient had an aura and felt that he was going to have a seizure and then had 3 seizures.  Patient is back to baseline on arrival.  Patient had a history of seizures and used to be on 4 different medicines.  Patient is back to baseline.  Given her history of seizures will give Keppra empirically. We will also get CBC and CMP and CT head.  We will observe   7:59 PM I reviewed labs and independently interpreted CT scans.  Labs and CT head unremarkable.  Patient is slightly sleepy after Keppra.  Patient states that she does not like to take her seizure meds and this is milder than previous.  I told her that she cannot drive and that I will prescribe Keppra and she needs to follow-up with neurology.  She may have seizure versus syncope.  Problems Addressed: Seizure-like activity [facility]): acute illness or injury  Amount and/or Complexity of Data Reviewed Labs: ordered. Decision-making details documented in ED Course. Radiology: ordered and independent interpretation performed. Decision-making details documented in ED Course. ECG/medicine tests: ordered and independent interpretation performed. Decision-making details documented in ED Course.  Risk Prescription drug management.    Final Clinical Impression(s) / ED Diagnoses Final diagnoses:  None    Rx / DC Orders ED Discharge Orders     None          Drenda Freeze, MD 07/18/22 2001

## 2022-07-18 NOTE — ED Triage Notes (Signed)
Pt BIB [facility] from church due to having a seizures.  Witnesses stated that she had 3 episodes, the first one lasted 10 seconds, second about 20 seconds and the third about 1-2 minutes.  Pt has not had seizures the last 3 years and has not taken medications for that long.  Pt currently AOX1 to person only.  VS BP 152/76, CBG 79.  22g left hand.  12 lead unremarkable.

## 2022-07-18 NOTE — Discharge Instructions (Signed)
You likely have mild seizures today.   As we discussed, I do recommend taking Keppra 500 twice daily.  I do not recommend that you drive until you are cleared by neurology  I have referred you back to [facility] neurology for follow-up  Return to ER if you have another seizure, passing out

## 2022-07-20 NOTE — Patient Instructions (Signed)
Seizure, Adult A seizure is a sudden burst of abnormal electrical and chemical activity in the brain. Seizures usually last from 30 seconds to 2 minutes. The abnormal activity temporarily interrupts normal brain function. Many types of seizures can affect adults. A seizure can cause many different symptoms depending on where in the brain it starts. What are the causes? Common causes of this condition include: Fever or infection. Brain injury, head trauma, bleeding in the brain, or a brain tumor. Low levels of blood sugar or salt (sodium). Kidney problems or liver problems. Metabolic disorders or other conditions that are passed from parent to child (are inherited). Reaction to a substance, such as a drug or a medicine, or suddenly stopping the use of a substance (withdrawal). A stroke. Developmental disorders such as autism spectrum disorder or cerebral palsy. In some cases, the cause of a seizure may not be known. Some people who have a seizure never have another one. A person who has repeated seizures over time without a clear cause has a condition called epilepsy. What increases the risk? You are more likely to develop this condition if: You have a family history of epilepsy. You have had a tonic-clonic seizure before. This type of seizure causes tightening (contraction) of the muscles of the whole body and loss of consciousness. You have a history of head trauma, lack of oxygen at birth, or strokes. What are the signs or symptoms? There are many different types of seizures. The symptoms vary depending on the type of seizure you have. Symptoms occur during the seizure. They may also occur before a seizure (aura) and after a seizure (postictal). Symptoms may include the following: Symptoms during a seizure Uncontrollable shaking (convulsions) with fast, jerky movements of muscles. Stiffening of the body. Breathing problems. Confusion, staring, or unresponsiveness. Head nodding, eye  blinking or fluttering, or rapid eye movements. Drooling, grunting, or making clicking sounds with your mouth. Loss of bladder control and bowel control. Symptoms before a seizure Fear or anxiety. Nausea. Vertigo. This is a feeling like: You are moving when you are not. Your surroundings are moving when they are not. Dj vu. This is a feeling of having seen or heard something before. Odd tastes or smells. Changes in vision, such as seeing flashing lights or spots. Symptoms after a seizure Confusion. Sleepiness. Headache. Sore muscles. How is this diagnosed? This condition may be diagnosed based on: A description of your symptoms. Video of your seizures can be helpful. Your medical history. A physical exam. You may also have tests, including: Blood tests. CT scan. MRI. Electroencephalogram (EEG). This test measures electrical activity in the brain. An EEG can predict whether seizures will return. A spinal tap, also called a lumbar puncture. This is the removal and testing of fluid that surrounds the brain and spinal cord. How is this treated? Most seizures will stop on their own in less than 5 minutes, and no treatment is needed. Seizures that last longer than 5 minutes will usually need treatment. Seizures may be treated with: Medicines given through an IV. Avoiding known triggers, such as medicines that you take for another condition. Medicines to control seizures or prevent future seizures (antiepileptics), if epilepsy caused your seizures. Medical devices to prevent and control seizures. Surgery to stop seizures or to reduce how often seizures happen, if you have epilepsy that does not respond to medicines. A diet low in carbohydrates and high in fat (ketogenic diet). Follow these instructions at home: Medicines Take over-the-counter and prescription medicines only  as told by your health care provider. Avoid any substances that may prevent your medicine from working  properly, such as alcohol. Activity Follow instructions about activities, such as driving or swimming, that would be dangerous if you had another seizure. Wait until your health care provider says it is safe to do them. If you live in the [place] check with your local department of motor vehicles [facility]) to find out about local driving laws. Each state has specific rules about when you can legally drive again. Get enough rest. Lack of sleep can make seizures more likely to occur. Educating others  Teach friends and family what to do if you have a seizure. They should: Help you get down to the ground, to prevent a fall. Cushion your head and move items away from your body. Loosen any tight clothing around your neck. Turn you on your side. If you vomit, this helps keep your airway clear. Know whether or not you need emergency care. Stay with you until you recover. Also, tell them what not to do if you have a seizure. Tell them: They should not hold you down. Holding you down will not stop the seizure. They should not put anything in your mouth. General instructions Avoid anything that has ever triggered a seizure for you. Keep a seizure diary. Record what you remember about each seizure, especially anything that might have triggered it. Keep all follow-up visits. This is important. Contact a health care provider if: You have another seizure or seizures. Call each time you have a seizure. Your seizure pattern changes. You continue to have seizures with treatment. You have symptoms of an infection or illness. Either of these might increase your risk of having a seizure. You are unable to take your medicine. Get help right away if: You have: A seizure that does not stop after 5 minutes. Several seizures in a row without a complete recovery between seizures. A seizure that makes it harder to breathe. A seizure that leaves you unable to speak or use a part of your body. You do not wake up right  away after a seizure. You injure yourself during a seizure. You have confusion or pain right after a seizure. These symptoms may represent a serious problem that is an emergency. Do not wait to see if the symptoms will go away. Get medical help right away. Call your local emergency services (911 in the [place] Do not drive yourself to the hospital. Summary Seizures are caused by abnormal electrical and chemical activity in the brain. The activity disrupts normal brain function and can cause various symptoms. Seizures have many causes, including illness, head injuries, low levels of blood sugar or salt, and certain conditions. Most seizures will stop on their own in less than 5 minutes. Seizures that last longer than 5 minutes are a medical emergency and need treatment right away. Many medicines are used to treat seizures. Take over-the-counter and prescription medicines only as told by your health care provider. This information is not intended to replace advice given to you by your health care provider. Make sure you discuss any questions you have with your health care provider. Document Revised: 06/07/2020 Document Reviewed: 06/07/2020 Elsevier Patient Education  [place].

## 2022-07-20 NOTE — Progress Notes (Unsigned)
Future Appointments  Date Time Provider Department  07/21/2022  9:30 AM Unk Pinto, MD [facility]  03/01/2023 10:00 AM Unk Pinto, MD [facility]    History of Present Illness:      Patient  is a very nice 61 y.o. MWF with  HTN, HLD, Prediabetes  and Vitamin D Deficiency.  Patient  has hx/o Bariatric Gastric Bipass surgery in 2000 & has hx/o glucose intolerance and had complications of hypoglycemic induced seizures .  Patient  has no history of  reactive hypoglycemic symptoms, visual blurring, diabetic polys or paresthesias in the last 10+  years .       3 days ago, she apparently had a generalized clonic /tonic Seizure in church and was taken by EMS to the ER.   CBC, CMET  &  Head CT scan were   negative & she was given IV Keppra and as she was stable, she was felt safe for discharge & continuing oral Keppra. She was advised by ER Dr not to drive.    Medications     albuterol (VENTOLIN HFA) 108 (90 Base) MCG/ACT inhaler, Inhale 1-2 puffs into the lungs every 6 (six) hours as needed for wheezing or shortness of breath.   loratadine (CLARITIN) 10 MG tablet, Take 10 mg by mouth daily.   aspirin 81 MG tablet, Take 81 mg by mouth daily.  Current Outpatient Medications (Hematological):    Cyanocobalamin (B-12) 5000 MCG SUBL, Place under the tongue.   IRON, FERROUS SULFATE, PO, Take 65 mg by mouth daily.  Current Outpatient Medications (Other):    Arginine 500 MG CAPS, Take 500 mg by mouth daily.   b complex vitamins tablet, Take 1 tablet by mouth daily.    levETIRAcetam (KEPPRA) 500 MG tablet, Take 1 tablet (500 mg total) by mouth 2 (two) times daily.    milk thistle 175 MG tablet, Take 175 mg by mouth daily.   Multiple Vitamin (MULTIVITAMIN) tablet, Take 1 tablet by mouth daily.    NEURONTIN 600 MG tablet, Take by mouth.   NON FORMULARY, XEO Mega, omega complex   NON FORMULARY, Bone Nutrient   polycarbophil (FIBERCON) 625 MG tablet, Take  daily.   Probiotic CAPS, Take 1  capsule by mouth daily.   TURMERIC PO, Take 1,500 mg  2 (two) times daily.   Vitamin D, Ergocalciferol, (DRISDOL) 50,000 units CAPS , Take 1 capsule (50,000 Units total) by mouth daily.  Problem list She has HTN (hypertension); Hyperlipidemia; Seizure disorder, primary generalized ([place]); ADD (attention deficit disorder); GERD (gastroesophageal reflux disease); Chronic left shoulder pain; Other abnormal glucose; Vitamin D deficiency; Medication management; Rectal bleeding; History of colonic polyps; Abnormal liver function tests; Liver fibrosis; NASH (nonalcoholic steatohepatitis); and Overweight (BMI 25.0-29.9) on their problem list.   Observations/Objective:  There were no vitals taken for this visit.  HEENT - WNL. Neck - supple.  Chest - Clear equal BS. Cor - Nl HS. RRR w/o sig MGR. PP 1(+). No edema. MS- FROM w/o deformities.  Gait Nl. Neuro -  Nl w/o focal abnormalities.   Assessment and Plan:  1. Nonintractable generalized idiopathic epilepsy without status epilepticus ([place])  - Levetiracetam, Immunoassay  2. Medication management  - Levetiracetam, Immunoassay   Follow Up Instructions:        I discussed the assessment and treatment plan with the patient. The patient was provided an opportunity to ask questions and all were answered. The patient agreed with the plan and demonstrated an understanding of the instructions.  The patient was advised to call back or seek an in-person evaluation if the symptoms worsen or if the condition fails to improve as anticipated.    [name], MD

## 2022-07-21 ENCOUNTER — Encounter: Payer: Self-pay | Admitting: Internal Medicine

## 2022-07-21 ENCOUNTER — Ambulatory Visit (INDEPENDENT_AMBULATORY_CARE_PROVIDER_SITE_OTHER): Payer: BC Managed Care – PPO | Admitting: Internal Medicine

## 2022-07-21 VITALS — BP 160/90 | HR 94 | Temp 98.0°F | Resp 16 | Ht 61.5 in | Wt 153.6 lb

## 2022-07-21 DIAGNOSIS — R0989 Other specified symptoms and signs involving the circulatory and respiratory systems: Secondary | ICD-10-CM | POA: Diagnosis not present

## 2022-07-21 DIAGNOSIS — Z79899 Other long term (current) drug therapy: Secondary | ICD-10-CM

## 2022-07-21 DIAGNOSIS — R569 Unspecified convulsions: Secondary | ICD-10-CM | POA: Diagnosis not present

## 2022-07-21 DIAGNOSIS — E162 Hypoglycemia, unspecified: Secondary | ICD-10-CM | POA: Diagnosis not present

## 2022-07-21 DIAGNOSIS — G40309 Generalized idiopathic epilepsy and epileptic syndromes, not intractable, without status epilepticus: Secondary | ICD-10-CM

## 2022-07-22 ENCOUNTER — Encounter (INDEPENDENT_AMBULATORY_CARE_PROVIDER_SITE_OTHER): Payer: Self-pay

## 2022-08-05 ENCOUNTER — Ambulatory Visit (INDEPENDENT_AMBULATORY_CARE_PROVIDER_SITE_OTHER): Payer: BC Managed Care – PPO | Admitting: Neurology

## 2022-08-05 ENCOUNTER — Encounter: Payer: Self-pay | Admitting: Neurology

## 2022-08-05 VITALS — BP 125/86 | HR 99 | Ht 61.6 in | Wt 155.5 lb

## 2022-08-05 DIAGNOSIS — R55 Syncope and collapse: Secondary | ICD-10-CM

## 2022-08-05 DIAGNOSIS — R569 Unspecified convulsions: Secondary | ICD-10-CM

## 2022-08-05 NOTE — Patient Instructions (Signed)
Routine EEG, if normal will proceed with 72-hour ambulatory EEG Continue to follow with PCP Return as needed

## 2022-08-05 NOTE — Progress Notes (Signed)
[facility]  PATIENT: Emily Graham DOB: [birth year]  REQUESTING CLINICIAN: Unk Pinto, MD HISTORY FROM: Patient  REASON FOR VISIT: Seizure like activity/Syncope   HISTORICAL  CHIEF COMPLAINT:  Chief Complaint  Patient presents with   New Patient (Initial Visit)    Rm 12. Alone. NP/internal ED referral for seizures.    HISTORY OF PRESENT ILLNESS:  This is a 61 year old woman with past medical history of seizure disorder, described as generalized seizure, last seizure was more than 10 years ago, asthma who is presenting after seizure-like activity.  Patient reports the day prior to the event on August 4 she was working at [employer], she was on her feet all day long, the next day, the day of the event, she continued to work in [employer]. It was a hot day and she was not eating until about 230 PM.  She reports eating a piece of cake and going back in the kitchen pantry, suddenly felt dizzy, felt hot, asked for help, reported they took her to the [place], she remember all and this was the last thing that she remembers.   She was told that she passed out total of 3 times, she remembers waking up in the ambulance with EMS.  Per chart review her initial blood sugar was 79.  In the ED her initial work-up was negative, she was given IV Keppra and discharged home on Keppra. She currently not taking the Keppra. Patient reported this event is different from her typical seizures.  Her typical seizure used to be generalized tonic-clonic seizure with tongue biting, urinary incontinence follow-up with but extreme exertion and muscle ache.  She did not have any of those additional symptoms. In the past, she was on Dilantin, Carbamazepine and Levetiracetam.   Handedness: Right handed   Onset: 07/18/2022, prior to that last seizure was 10 year ago   Seizure Type: Reported a generalized convulsion but this event was described as passing out  Current frequency: August 5,  her last event was more than 10 years ago  Any injuries from seizures: Denies   Seizure risk factors: Skull fracture (hairline)  Previous ASMs: Dilantin, Carbamazepine, Keppra   Currenty ASMs: None   ASMs side effects: Denies  Brain Images: Normal CT   Previous EEGs: Not available for review   OTHER MEDICAL CONDITIONS: asthma,   REVIEW OF SYSTEMS: Full 14 system review of systems performed and negative with exception of: As noted in the HPI   ALLERGIES: Allergies  Allergen Reactions   Latex Rash    HOME MEDICATIONS: Outpatient Medications Prior to Visit  Medication Sig Dispense Refill   albuterol (VENTOLIN HFA) 108 (90 Base) MCG/ACT inhaler Inhale 1-2 puffs into the lungs every 6 (six) hours as needed for wheezing or shortness of breath. 18 g 1   Arginine 500 MG CAPS Take 500 mg by mouth daily.     aspirin 81 MG tablet Take 81 mg by mouth daily.     b complex vitamins tablet Take 1 tablet by mouth daily.     Cyanocobalamin (B-12) 5000 MCG SUBL Place under the tongue.     IRON, FERROUS SULFATE, PO Take 65 mg by mouth daily.     loratadine (CLARITIN) 10 MG tablet Take 10 mg by mouth daily.     milk thistle 175 MG tablet Take 175 mg by mouth daily.     Multiple Vitamin (MULTIVITAMIN) tablet Take 1 tablet by mouth daily.      Multiple Vitamins-Minerals (HAIR SKIN  AND NAILS FORMULA PO) Take 1 tablet by mouth daily.     NEURONTIN 600 MG tablet Take by mouth.     Omega-3 Fatty Acids (FISH OIL PO) Take 1 capsule by mouth daily.     polycarbophil (FIBERCON) 625 MG tablet Take 625 mg by mouth daily.     Probiotic CAPS Take 1 capsule by mouth daily.     TURMERIC PO Take 1,500 mg by mouth 2 (two) times daily.     Vitamin D, Ergocalciferol, (DRISDOL) 50000 units CAPS capsule Take 1 capsule (50,000 Units total) by mouth daily. 90 capsule 1   levETIRAcetam (KEPPRA) 500 MG tablet Take 1 tablet (500 mg total) by mouth 2 (two) times daily. (Patient not taking: Reported on 07/21/2022) 60  tablet 0   NON FORMULARY XEO Mega, omega complex     NON FORMULARY Bone Nutrient     No facility-administered medications prior to visit.    PAST MEDICAL HISTORY: Past Medical History:  Diagnosis Date   ADD (attention deficit disorder) 07/19/2013   Arthritis    Cholecystitis, acute with cholelithiasis 07/19/2013   Chronic left shoulder pain 07/19/2013   Dyspnea    GERD (gastroesophageal reflux disease) 07/19/2013   Goiter    HTN (hypertension)    Hypoglycemia    Hypoglycemia 07/19/2013   Murmur    Pre-diabetes    Seizure disorder, primary generalized ([place]) 07/19/2013   May be related to hypoglycemia   Seizures ([place]) last 01/08/18   2013-grand mal due to hypoglycemia   Sleep apnea    "mild"-not using CPAP per pt    PAST SURGICAL HISTORY: Past Surgical History:  Procedure Laterality Date   ABDOMINAL HYSTERECTOMY     BREAST ENHANCEMENT SURGERY     CHOLECYSTECTOMY N/A 07/20/2013   Procedure: LAPAROSCOPIC CHOLECYSTECTOMY WITH INTRAOPERATIVE CHOLANGIOGRAM;  Surgeon: Shann Medal, MD;  Location: [facility];  Service: General;  Laterality: N/A;   GASTRIC BYPASS     GASTRIC BYPASS     GASTRIC BYPASS  2000   leg lift  2008   had aspiration in lungs, fluid was "drained" per pt/this was done at outpatient center/RM   Stomach tuck     TOTAL SHOULDER REPLACEMENT      FAMILY HISTORY: Family History  Problem Relation Age of Onset   Coronary artery disease Father        MI at age 22   Heart disease Father    Hypertension Father    Diabetes Father    Skin cancer Mother    Hypertension Mother    Hyperlipidemia Mother    Breast cancer Maternal Aunt    Cancer Maternal Aunt    Breast cancer Paternal Aunt    Breast cancer Paternal Uncle    Cancer Cousin    Colon cancer Neg Hx     SOCIAL HISTORY: Social History   Socioeconomic History   Marital status: Married    Spouse name: Not on file   Number of children: 2   Years of education: Not on file   Highest education level: Not on file   Occupational History    Employer: UNEMPLOYED  Tobacco Use   Smoking status: Never   Smokeless tobacco: Never  Vaping Use   Vaping Use: Never used  Substance and Sexual Activity   Alcohol use: Yes    Alcohol/week: 3.0 standard drinks of alcohol    Types: 1 Standard drinks or equivalent, 2 Glasses of wine per week    Comment: Occasional/social    Drug  use: No   Sexual activity: Yes  Other Topics Concern   Not on file  Social History Narrative   Not on file   Social Determinants of Health   Financial Resource Strain: Not on file  Food Insecurity: Not on file  Transportation Needs: Not on file  Physical Activity: Not on file  Stress: Not on file  Social Connections: Not on file  Intimate Partner Violence: Not on file    PHYSICAL EXAM  GENERAL EXAM/CONSTITUTIONAL: Vitals:  Vitals:   08/05/22 0830  BP: 125/86  Pulse: 99  Weight: 155 lb 8 oz (70.5 kg)  Height: 5' 1.6" (1.565 m)   Body mass index is 28.81 kg/m. Wt Readings from Last 3 Encounters:  08/05/22 155 lb 8 oz (70.5 kg)  07/21/22 153 lb 9.6 oz (69.7 kg)  07/18/22 157 lb 10.1 oz (71.5 kg)   Patient is in no distress; well developed, nourished and groomed; neck is supple  EYES: Pupils round and reactive to light, Visual fields full to confrontation, Extraocular movements intacts,  No results found.  MUSCULOSKELETAL: Gait, strength, tone, movements noted in Neurologic exam below  NEUROLOGIC: MENTAL STATUS:      No data to display         awake, alert, oriented to person, place and time recent and remote memory intact normal attention and concentration language fluent, comprehension intact, naming intact fund of knowledge appropriate  CRANIAL NERVE:  2nd, 3rd, 4th, 6th - pupils equal and reactive to light, visual fields full to confrontation, extraocular muscles intact, no nystagmus 5th - facial sensation symmetric 7th - facial strength symmetric 8th - hearing intact 9th - palate elevates  symmetrically, uvula midline 11th - shoulder shrug symmetric 12th - tongue protrusion midline  MOTOR:  normal bulk and tone, full strength in the BUE, BLE  SENSORY:  normal and symmetric to light touch, pinprick, temperature, vibration  COORDINATION:  finger-nose-finger, fine finger movements normal  REFLEXES:  deep tendon reflexes present and symmetric  GAIT/STATION:  normal   DIAGNOSTIC DATA (LABS, IMAGING, TESTING) - I reviewed patient records, labs, notes, testing and imaging myself where available.  Lab Results  Component Value Date   WBC 6.2 07/18/2022   HGB 12.3 07/18/2022   HCT 39.6 07/18/2022   MCV 83.7 07/18/2022   PLT 241 07/18/2022      Component Value Date/Time   NA 137 07/18/2022 1802   K 4.7 07/18/2022 1802   CL 105 07/18/2022 1802   CO2 26 07/18/2022 1802   GLUCOSE 101 (H) 07/18/2022 1802   BUN 16 07/18/2022 1802   CREATININE 0.72 07/18/2022 1802   CREATININE 0.74 02/18/2022 1508   CALCIUM 9.0 07/18/2022 1802   PROT 6.9 07/18/2022 1802   ALBUMIN 4.0 07/18/2022 1802   AST 28 07/18/2022 1802   ALT 21 07/18/2022 1802   ALKPHOS 86 07/18/2022 1802   BILITOT 0.7 07/18/2022 1802   GFRNONAA >60 07/18/2022 1802   GFRNONAA 87 02/11/2021 1500   GFRAA 100 02/11/2021 1500   Lab Results  Component Value Date   CHOL 164 02/18/2022   HDL 71 02/18/2022   LDLCALC 80 02/18/2022   TRIG 54 02/18/2022   Lab Results  Component Value Date   HGBA1C 5.2 02/18/2022   Lab Results  Component Value Date   VITAMINB12 >2,000 (H) 02/18/2022   Lab Results  Component Value Date   TSH 2.03 02/18/2022    Head CT 07/18/2022 No acute intracranial abnormality.    ASSESSMENT AND PLAN  60  y.o. year old female  with history of generalized seizure more than 10 years ago, asthma, who is presenting after an event on August 5, event described as passing out.  There was no reported generalized convulsion, no injury, patient remembers waking up in the ambulance.  Her  initial blood sugar was 79.  While I do agree these events are different from her typical seizure, we will still need to do additional evaluation with EEG to rule out seizure definitively.  She self discontinued her Keppra, I informed her it is reasonable to wait until we do additional work-up to resume Keppra.  In the case that she has another event concerning for seizure, she should restart the Keppra. If EEG is abnormal also she should restart Keppra.  I will contact her to go over the results.  In the case that the routine EEG is normal then we will proceed with a 72-hour EEG.  Continue to follow with PCP return as needed.   1. Seizure-like activity ([place])   2. Syncope, unspecified syncope type     Patient Instructions  Routine EEG, if normal will proceed with 72-hour ambulatory EEG Continue to follow with PCP Return as needed   Per [facility] statutes, patients with seizures are not allowed to drive until they have been seizure-free for six months.  Other recommendations include using caution when using heavy equipment or power tools. Avoid working on ladders or at heights. Take showers instead of baths.  Do not swim alone.  Ensure the water temperature is not too high on the home water heater. Do not go swimming alone. Do not lock yourself in a room alone (i.e. bathroom). When caring for infants or small children, sit down when holding, feeding, or changing them to minimize risk of injury to the child in the event you have a seizure. Maintain good sleep hygiene. Avoid alcohol.  Also recommend adequate sleep, hydration, good diet and minimize stress.   During the Seizure  - First, ensure adequate ventilation and place patients on the floor on their left side  Loosen clothing around the neck and ensure the airway is patent. If the patient is clenching the teeth, do not force the mouth open with any object as this can cause severe damage - Remove all items from the surrounding that can  be hazardous. The patient may be oblivious to what's happening and may not even know what he or she is doing. If the patient is confused and wandering, either gently guide him/her away and block access to outside areas - Reassure the individual and be comforting - Call 911. In most cases, the seizure ends before EMS arrives. However, there are cases when seizures may last over 3 to 5 minutes. Or the individual may have developed breathing difficulties or severe injuries. If a pregnant patient or a person with diabetes develops a seizure, it is prudent to call an ambulance. - Finally, if the patient does not regain full consciousness, then call EMS. Most patients will remain confused for about 45 to 90 minutes after a seizure, so you must use judgment in calling for help. - Avoid restraints but make sure the patient is in a bed with padded side rails - Place the individual in a lateral position with the neck slightly flexed; this will help the saliva drain from the mouth and prevent the tongue from falling backward - Remove all nearby furniture and other hazards from the area - Provide verbal assurance as the individual is  regaining consciousness - Provide the patient with privacy if possible - Call for help and start treatment as ordered by the caregiver   After the Seizure (Postictal Stage)  After a seizure, most patients experience confusion, fatigue, muscle pain and/or a headache. Thus, one should permit the individual to sleep. For the next few days, reassurance is essential. Being calm and helping reorient the person is also of importance.  Most seizures are painless and end spontaneously. Seizures are not harmful to others but can lead to complications such as stress on the lungs, brain and the heart. Individuals with prior lung problems may develop labored breathing and respiratory distress.     Orders Placed This Encounter  Procedures   EEG adult    No orders of the defined types were  placed in this encounter.   Return if symptoms worsen or fail to improve.    Alric Ran, MD 08/05/2022, 9:12 AM  [facility] [address] [place], [place] [address] [phone]

## 2022-08-11 ENCOUNTER — Ambulatory Visit (INDEPENDENT_AMBULATORY_CARE_PROVIDER_SITE_OTHER): Payer: BC Managed Care – PPO | Admitting: Neurology

## 2022-08-11 ENCOUNTER — Encounter: Payer: Self-pay | Admitting: Neurology

## 2022-08-11 DIAGNOSIS — R569 Unspecified convulsions: Secondary | ICD-10-CM

## 2022-08-11 NOTE — Procedures (Signed)
    History:  61 year old woman with seizure like activity   EEG classification: Awake and drowsy  Description of the recording: The background rhythms of this recording consists of a fairly well modulated medium amplitude alpha rhythm of 11 Hz that is reactive to eye opening and closure. As the record progresses, the patient appears to remain in the waking state throughout the recording. Photic stimulation was performed, did not show any abnormalities. Hyperventilation was also performed, did not show any abnormalities. Toward the end of the recording, the patient enters the drowsy state with slight symmetric slowing seen. The patient never enters stage II sleep. No abnormal epileptiform discharges seen during this recording. There was no focal slowing. EKG monitor shows no evidence of cardiac rhythm abnormalities with a heart rate of 78.  Abnormality: None   Impression: This is a normal EEG recording in the waking and drowsy state. No evidence of interictal epileptiform discharges seen. A normal EEG does not exclude a diagnosis of epilepsy.    Alric Ran, MD [facility]

## 2022-11-27 ENCOUNTER — Telehealth: Payer: Self-pay

## 2022-11-27 NOTE — Patient Outreach (Signed)
  Care Coordination   Initial Visit Note   11/27/2022 Name: Emily Graham MRN: [id number] DOB: 1961/07/27  Emily Graham is a 61 y.o. year old female who sees Unk Pinto, MD for primary care. I spoke with  Maurine Minister by phone today.  What matters to the patients health and wellness today?  none    Goals Addressed             This Visit's Progress    COMPLETED: Care Coordination Activities-No follow up required       Care Coordination Interventions: Advised patient to Annual wellness exam and flu vaccine.  Discussed [facility] services and support.  Patient decline          SDOH assessments and interventions completed:  Yes  SDOH Interventions Today    Flowsheet Row Most Recent Value  SDOH Interventions   Housing Interventions Intervention Not Indicated  Transportation Interventions Intervention Not Indicated        Care Coordination Interventions:  Yes, provided   Follow up plan: No further intervention required.   Encounter Outcome:  Pt. Visit Completed   Jone Baseman, RN, MSN [facility] Care Management Coordinator Direct Line [phone]

## 2022-11-27 NOTE — Patient Instructions (Signed)
Visit Information  Thank you for taking time to visit with me today. Please don't hesitate to contact me if I can be of assistance to you.   Following are the goals we discussed today:   Goals Addressed             This Visit's Progress    COMPLETED: Care Coordination Activities-No follow up required       Care Coordination Interventions: Advised patient to Annual wellness exam and flu vaccine.  Discussed [facility] services and support.  Patient decline           If you are experiencing a Mental Health or [place] or need someone to talk to, please call the Suicide and Crisis Lifeline: 988   Patient verbalizes understanding of instructions and care plan provided today and agrees to view in [place]. Active MyChart status and patient understanding of how to access instructions and care plan via MyChart confirmed with patient.     No further follow up required:    Jone Baseman, RN, MSN [place] [facility] Care Management Coordinator Direct Line [phone]

## 2023-01-27 DIAGNOSIS — H10413 Chronic giant papillary conjunctivitis, bilateral: Secondary | ICD-10-CM | POA: Diagnosis not present

## 2023-01-27 DIAGNOSIS — H5213 Myopia, bilateral: Secondary | ICD-10-CM | POA: Diagnosis not present

## 2023-02-23 ENCOUNTER — Encounter: Payer: BC Managed Care – PPO | Admitting: Internal Medicine

## 2023-02-28 ENCOUNTER — Encounter: Payer: Self-pay | Admitting: Internal Medicine

## 2023-02-28 NOTE — Progress Notes (Unsigned)
Annual Screening/Preventative Visit & Comprehensive Evaluation &  Examination   Future Appointments  Date Time Provider Department  03/01/2023 10:00 AM Unk Pinto, MD [facility]  03/07/2024 10:00 AM Unk Pinto, MD [facility]        This very nice 62 y.o. MWF  with   HTN, HLD, Prediabetes  and Vitamin D Deficiency presents for a Screening /Preventative Visit & comprehensive evaluation and management of multiple medical co-morbidities.          Labile HTN predates since 2000.   Patient's BP has been controlled at home and patient denies any cardiac symptoms as chest pain, palpitations, shortness of breath, dizziness or ankle swelling. Today's BP - 130/78 .         Patient's hyperlipidemia is controlled with diet and medications. Patient denies myalgias or other medication SE's. Last lipids were at goal :  Lab Results  Component Value Date   CHOL 164 02/18/2022   HDL 71 02/18/2022   LDLCALC 80 02/18/2022   TRIG 54 02/18/2022   CHOLHDL 2.3 02/18/2022          In 2000,  patient underwent  Bariatric Gastric Bipass surgery and has hx/o glucose intolerance and has hx/o complications of hypoglycemic induced seizures .  In Aug 2023, she did apparently have an episode of fainting and was seen by neurologist , Dr April Manson  & patient had a normal EEG.   Patient denies any recent reactive hypoglycemic symptoms in the last 10 years as  visual blurring, diabetic polys or paresthesias. Last A1c was at goal :  Lab Results  Component Value Date   HGBA1C 5.2 02/18/2022    Wt Readings from Last 3 Encounters:  03/01/23 159 lb 6.4 oz (72.3 kg)  08/05/22 155 lb 8 oz (70.5 kg)  07/21/22 153 lb 9.6 oz (69.7 kg)         Finally, patient has history of Vitamin D Deficiency  ("27" /2009) and last Vitamin D was elevated and dose was tapered :  Lab Results  Component Value Date   VD25OH 118 (H) 02/18/2022       Current Outpatient Medications on File Prior to Visit  Medication  Sig   albuterol HFA  inhaler Inhale 1-2 puffs  every 6 hours as needed    Arginine 500 MG CAPS Take daily.   aspirin 81 MG tablet Take  daily.   b complex vitamins tablet Take 1 tablet  daily.   Vitamin B-12 5000 mcg SL Place under the tongue.   IRON, FERROUS SULFATE 65 mg  Take  daily.   loratadine  10 MG tablet Take 1 daily.   milk thistle 175 MG tablet Take  daily.   Multiple Vitamin  Take 1 tablet daily.    XEO Mega, omega complex    Bone Nutrient    FIBERCON) 625 MG tablet Take  daily.   Probiotic CAPS  1,500 mg  Take 1 capsule  daily.   TURMERIC  Take 2  times daily.   Vitamin D 50,000 units CAP Take 1 capsule  every other day     Allergies  Allergen Reactions   Latex Rash    Past Medical History:  Diagnosis Date   ADD (attention deficit disorder) 07/19/2013   Arthritis    Cholecystitis, acute with cholelithiasis 07/19/2013   Chronic left shoulder pain 07/19/2013   Dyspnea    GERD (gastroesophageal reflux disease) 07/19/2013   Goiter    HTN (hypertension)    Hypoglycemia  Hypoglycemia 07/19/2013   Murmur    Pre-diabetes    Seizure disorder, primary generalized ([place]) 07/19/2013   May be related to hypoglycemia   Seizures ([place]) last 01/08/18   2013-grand mal due to hypoglycemia   Sleep apnea    "mild"-not using CPAP per pt    Health Maintenance  Topic Date Due   Zoster Vaccines- Shingrix (1 of 2) Never done   PAP SMEAR-Modifier  Never done   COVID-19 Vaccine (3 - Pfizer risk series) 07/12/2020   INFLUENZA VACCINE  07/14/2021   MAMMOGRAM  01/22/2023   TETANUS/TDAP  01/24/2030   Hepatitis C Screening  Completed   HIV Screening  Completed   HPV VACCINES  Aged Out     Immunization History  Administered Date(s) Administered   Influenza Inj Mdck  10/31/2020   Influenza 10/04/2019   Influenza 09/14/2018, 10/04/2019   PFIZER SARS-COV-2 Vacc 05/24/2020, 06/14/2020   PPD Test 12/08/2018, 01/25/2020, 02/11/2021   Pneumococcal -23 12/14/1998   Td 01/25/2020   Tdap  11/06/2009    Last Colon - 01/28/2018 - Dr Loletha Carrow - recommended repeat in 10 years - due Feb 2029   Last MGM - 01/22/2021 - patient aware overdue   Past Surgical History:  Procedure Laterality Date   ABDOMINAL HYSTERECTOMY     BREAST ENHANCEMENT SURGERY     CHOLECYSTECTOMY N/A 07/20/2013   LAPAROSCOPIC CHOLECYSTECTOMY WITH INTRAOPERATIVE CHOLANGIOGRAM;  Shann Medal, MD   GASTRIC BYPASS  2000   leg lift  2008   had aspiration in lungs, fluid was "drained" per pt/this was done at outpatient center/[facility]   Stomach tuck     TOTAL SHOULDER REPLACEMENT       Family History  Problem Relation Age of Onset   Coronary artery disease Father        MI at age 39   Heart disease Father    Hypertension Father    Diabetes Father    Skin cancer Mother    Hypertension Mother    Hyperlipidemia Mother    Breast cancer Maternal Aunt    Cancer Maternal Aunt    Breast cancer Paternal Aunt    Breast cancer Paternal Uncle    Cancer Cousin    Colon cancer Neg Hx      Social History   Tobacco Use   Smoking status: Never   Smokeless tobacco: Never  Vaping Use   Vaping Use: Never used  Substance Use Topics   Alcohol use: Yes    Alcohol/week: 3.0 standard drinks    Types: 1 Standard drinks or equivalent, 2 Glasses of wine per week    Comment: Occasional/social    Drug use: No      ROS Constitutional: Denies fever, chills, weight loss/gain, headaches, insomnia,  night sweats, and change in appetite. Does c/o fatigue. Eyes: Denies redness, blurred vision, diplopia, discharge, itchy, watery eyes.  ENT: Denies discharge, congestion, post nasal drip, epistaxis, sore throat, earache, hearing loss, dental pain, Tinnitus, Vertigo, Sinus pain, snoring.  Cardio: Denies chest pain, palpitations, irregular heartbeat, syncope, dyspnea, diaphoresis, orthopnea, PND, claudication, edema Respiratory: denies cough, dyspnea, DOE, pleurisy, hoarseness, laryngitis, wheezing.  Gastrointestinal: Denies  dysphagia, heartburn, reflux, water brash, pain, cramps, nausea, vomiting, bloating, diarrhea, constipation, hematemesis, melena, hematochezia, jaundice, hemorrhoids Genitourinary: Denies dysuria, frequency, urgency, nocturia, hesitancy, discharge, hematuria, flank pain Breast: Breast lumps, nipple discharge, bleeding.  Musculoskeletal: Denies arthralgia, myalgia, stiffness, Jt. Swelling, pain, limp, and strain/sprain. Denies falls. Skin: Denies puritis, rash, hives, warts, acne, eczema, changing in skin lesion  Neuro: No weakness, tremor, incoordination, spasms, paresthesia, pain Psychiatric: Denies confusion, memory loss, sensory loss. Denies Depression. Endocrine: Denies change in weight, skin, hair change, nocturia, and paresthesia, diabetic polys, visual blurring, hyper / hypo glycemic episodes.  Heme/Lymph: No excessive bleeding, bruising, enlarged lymph nodes.  Physical Exam  BP 130/78   Pulse 64   Temp 97.8 F (36.6 C)   Resp 17   Ht 5' 1.6" (1.565 m)   Wt 159 lb 6.4 oz (72.3 kg)   SpO2 98%   BMI 29.53 kg/m   General Appearance: Over  nourished, well groomed and in no apparent distress.  Eyes: PERRLA, EOMs, conjunctiva no swelling or erythema, normal fundi and vessels. Sinuses: No frontal/maxillary tenderness ENT/Mouth: EACs patent / TMs  nl. Nares clear without erythema, swelling, mucoid exudates. Oral hygiene is good. No erythema, swelling, or exudate. Tongue normal, non-obstructing. Tonsils not swollen or erythematous. Hearing normal.  Neck: Supple, thyroid not palpable. No bruits, nodes or JVD. Respiratory: Respiratory effort normal.  BS equal and clear bilateral without rales, rhonci, wheezing or stridor. Cardio: Heart sounds are normal with regular rate and rhythm and no murmurs, rubs or gallops. Peripheral pulses are normal and equal bilaterally without edema. No aortic or femoral bruits. Chest: symmetric with normal excursions and percussion. Breasts: Symmetric, without  lumps, nipple discharge, retractions, or fibrocystic changes.  Abdomen: Flat, soft with bowel sounds active. Nontender, no guarding, rebound, hernias, masses, or organomegaly.  Lymphatics: Non tender without lymphadenopathy.  Genitourinary:  Musculoskeletal: Full ROM all peripheral extremities, joint stability, 5/5 strength, and normal gait. Skin: Warm and dry without rashes, lesions, cyanosis, clubbing or  ecchymosis.  Neuro: Cranial nerves intact, reflexes equal bilaterally. Normal muscle tone, no cerebellar symptoms. Sensation intact.  Pysch: Alert and oriented X 3, normal affect, Insight and Judgment appropriate.    Assessment and Plan  1. Annual Preventative Screening Examination   2. Labile hypertension  - EKG 12-Lead - [place], RETROPERITNL ABD,  LTD - Urinalysis, Routine w reflex microscopic - Microalbumin / creatinine urine ratio - CBC with Differential/Platelet - COMPLETE METABOLIC PANEL WITH GFR - Magnesium - TSH  3. Hyperlipidemia, mixed  - EKG 12-Lead - [place], RETROPERITNL ABD,  LTD - Lipid panel - TSH  4. Abnormal glucose  - EKG 12-Lead - [place], RETROPERITNL ABD,  LTD - Hemoglobin A1c - Insulin, random  5. Vitamin D deficiency  - VITAMIN D 25 Hydroxy   6. Screening for ischemic heart disease  - EKG 12-Lead  7. B12 deficiency  - Vitamin B12 - CBC with Differential/Platelet  8. Screening for colorectal cancer  - POC Hemoccult Bld/Stl   9. Screening examination for pulmonary tuberculosis  - TB Skin Test  10. FHx: heart disease  - EKG 12-Lead  11. Screening for AAA (aortic abdominal aneurysm)  - [place], RETROPERITNL ABD,  LTD  12. Fatigue, unspecified type  - Iron, Total/Total Iron Binding Cap - Vitamin B12 - CBC with Differential/Platelet - TSH  13. Medication management  - Urinalysis, Routine w reflex microscopic - Microalbumin / creatinine urine ratio - CBC with Differential/Platelet - COMPLETE METABOLIC PANEL WITH GFR - Magnesium -  Lipid panel - TSH - Hemoglobin A1c - Insulin, random - VITAMIN D 25 Hydroxy         Patient was counseled in prudent diet to achieve/maintain BMI less than 25 for weight control, BP monitoring, regular exercise and medications. Discussed med's effects and SE's. Screening labs and tests as requested with regular follow-up as recommended. Over 40 minutes  of exam, counseling, chart review and high complex critical decision making was performed.   [name], MD

## 2023-02-28 NOTE — Patient Instructions (Signed)
Due to recent changes in healthcare laws, you may see the results of your imaging and laboratory studies on MyChart before your provider has had a chance to review them.  We understand that in some cases there may be results that are confusing or concerning to you. Not all laboratory results come back in the same time frame and the provider may be waiting for multiple results in order to interpret others.  Please give us 48 hours in order for your provider to thoroughly review all the results before contacting the office for clarification of your results.   ++++++++++++++++++++++++++++++  Vit D  & Vit C 1,000 mg   are recommended to help protect  against the Covid-19 and other Corona viruses.    Also it's recommended  to take  Zinc 50 mg  to help  protect against the Covid-19   and best place to get  is also on [employer]  and don't pay more than 6-8 cents /pill !  ================================ Coronavirus (COVID-19) Are you at risk?  Are you at risk for the Coronavirus (COVID-19)?  To be considered HIGH RISK for Coronavirus (COVID-19), you have to meet the following criteria:  Traveled to [place], [place], [place], [place] or [place]; or in the [place] to [place], [place], [place]  or [place]; and have fever, cough, and shortness of breath within the last 2 weeks of travel OR Been in close contact with a person diagnosed with COVID-19 within the last 2 weeks and have  fever, cough,and shortness of breath  IF YOU DO NOT MEET THESE CRITERIA, YOU ARE CONSIDERED LOW RISK FOR COVID-19.  What to do if you are HIGH RISK for COVID-19?  If you are having a medical emergency, call 911. Seek medical care right away. Before you go to a doctor's office, urgent care or emergency department,  call ahead and tell them about your recent travel, contact with someone diagnosed with COVID-19   and your symptoms.  You should receive instructions from your physician's office regarding  next steps of care.  When you arrive at healthcare provider, tell the healthcare staff immediately you have returned from  visiting [place], [place], [place], [place] or [place]; or traveled in the [place] to [place], [place],  [place] or [place] in the last two weeks or you have been in close contact with a person diagnosed with  COVID-19 in the last 2 weeks.   Tell the health care staff about your symptoms: fever, cough and shortness of breath. After you have been seen by a medical provider, you will be either: Tested for (COVID-19) and discharged home on quarantine except to seek medical care if  symptoms worsen, and asked to  Stay home and avoid contact with others until you get your results (4-5 days)  Avoid travel on public transportation if possible (such as bus, train, or airplane) or Sent to the Emergency Department by EMS for evaluation, COVID-19 testing  and  possible admission depending on your condition and test results.  What to do if you are LOW RISK for COVID-19?  Reduce your risk of any infection by using the same precautions used for avoiding the common cold or flu:  Wash your hands often with soap and warm water for at least 20 seconds.  If soap and water are not readily available,  use an alcohol-based hand sanitizer with at least 60% alcohol.  If coughing or sneezing, cover your mouth and nose by coughing   or sneezing into the elbow areas of your shirt or coat,  into a tissue or into your sleeve (not your hands). Avoid shaking hands with others and consider head nods or verbal greetings only. Avoid touching your eyes, nose, or mouth with unwashed hands.  Avoid close contact with people who are sick. Avoid places or events with large numbers of people in one location, like concerts or sporting events. Carefully consider travel plans you have or are making. If you are planning any travel outside or inside the [place], visit the CDC's Travelers' Health webpage for  the latest health notices. If you have some symptoms but not all symptoms, continue to monitor at home and seek medical attention  if your symptoms worsen. If you are having a medical emergency, call 911. >>>>>>>>>>>>>>>>>>>>>>> Preventive Care for Adults  A healthy lifestyle and preventive care can promote health and wellness. Preventive health guidelines for women include the following key practices. A routine yearly physical is a good way to check with your health care provider about your health and preventive screening. It is a chance to share any concerns and updates on your health and to receive a thorough exam. Visit your dentist for a routine exam and preventive care every 6 months. Brush your teeth twice a day and floss once a day. Good oral hygiene prevents tooth decay and gum disease. The frequency of eye exams is based on your age, health, family medical history, use of contact lenses, and other factors. Follow your health care provider's recommendations for frequency of eye exams. Eat a healthy diet. Foods like vegetables, fruits, whole grains, low-fat dairy products, and lean protein foods contain the nutrients you need without too many calories. Decrease your intake of foods high in solid fats, added sugars, and salt. Eat the right amount of calories for you. Get information about a proper diet from your health care provider, if necessary. Regular physical exercise is one of the most important things you can do for your health. Most adults should get at least 150 minutes of moderate-intensity exercise (any activity that increases your heart rate and causes you to sweat) each week. In addition, most adults need muscle-strengthening exercises on 2 or more days a week. Maintain a healthy weight. The body mass index (BMI) is a screening tool to identify possible weight problems. It provides an estimate of body fat based on height and weight. Your health care provider can find your BMI and can  help you achieve or maintain a healthy weight. For adults 20 years and older: A BMI below 18.5 is considered underweight. A BMI of 18.5 to 24.9 is normal. A BMI of 25 to 29.9 is considered overweight. A BMI of 30 and above is considered obese. Maintain normal blood lipids and cholesterol levels by exercising and minimizing your intake of saturated fat. Eat a balanced diet with plenty of fruit and vegetables. Blood tests for lipids and cholesterol should begin at age 20 and be repeated every 5 years. If your lipid or cholesterol levels are high, you are over 50, or you are at high risk for heart disease, you may need your cholesterol levels checked more frequently. Ongoing high lipid and cholesterol levels should be treated with medicines if diet and exercise are not working. If you smoke, find out from your health care provider how to quit. If you do not use tobacco, do not start. Lung cancer screening is recommended for adults aged 55-80 years who are at high risk for developing   lung cancer because of a history of smoking. A yearly low-dose CT scan of the lungs is recommended for people who have at least a 30-pack-year history of smoking and are a current smoker or have quit within the past 15 years. A pack year of smoking is smoking an average of 1 pack of cigarettes a day for 1 year (for example: 1 pack a day for 30 years or 2 packs a day for 15 years). Yearly screening should continue until the smoker has stopped smoking for at least 15 years. Yearly screening should be stopped for people who develop a health problem that would prevent them from having lung cancer treatment. High blood pressure causes heart disease and increases the risk of stroke. Your blood pressure should be checked at least every 1 to 2 years. Ongoing high blood pressure should be treated with medicines if weight loss and exercise do not work. If you are 55-79 years old, ask your health care provider if you should take aspirin to  prevent strokes. Diabetes screening involves taking a blood sample to check your fasting blood sugar level. This should be done once every 3 years, after age 45, if you are within normal weight and without risk factors for diabetes. Testing should be considered at a younger age or be carried out more frequently if you are overweight and have at least 1 risk factor for diabetes. Breast cancer screening is essential preventive care for women. You should practice "breast self-awareness." This means understanding the normal appearance and feel of your breasts and may include breast self-examination. Any changes detected, no matter how small, should be reported to a health care provider. Women in their 20s and 30s should have a clinical breast exam (CBE) by a health care provider as part of a regular health exam every 1 to 3 years. After age 40, women should have a CBE every year. Starting at age 40, women should consider having a mammogram (breast X-ray test) every year. Women who have a family history of breast cancer should talk to their health care provider about genetic screening. Women at a high risk of breast cancer should talk to their health care providers about having an MRI and a mammogram every year. Breast cancer gene (BRCA)-related cancer risk assessment is recommended for women who have family members with BRCA-related cancers. BRCA-related cancers include breast, ovarian, tubal, and peritoneal cancers. Having family members with these cancers may be associated with an increased risk for harmful changes (mutations) in the breast cancer genes BRCA1 and BRCA2. Results of the assessment will determine the need for genetic counseling and BRCA1 and BRCA2 testing. Routine pelvic exams to screen for cancer are no longer recommended for nonpregnant women who are considered low risk for cancer of the pelvic organs (ovaries, uterus, and vagina) and who do not have symptoms. Ask your health care provider if a  screening pelvic exam is right for you. If you have had past treatment for cervical cancer or a condition that could lead to cancer, you need Pap tests and screening for cancer for at least 20 years after your treatment. If Pap tests have been discontinued, your risk factors (such as having a new sexual partner) need to be reassessed to determine if screening should be resumed. Some women have medical problems that increase the chance of getting cervical cancer. In these cases, your health care provider may recommend more frequent screening and Pap tests. Colorectal cancer can be detected and often prevented. Most routine colorectal   cancer screening begins at the age of 50 years and continues through age 75 years. However, your health care provider may recommend screening at an earlier age if you have risk factors for colon cancer. On a yearly basis, your health care provider may provide home test kits to check for hidden blood in the stool. Use of a small camera at the end of a tube, to directly examine the colon (sigmoidoscopy or colonoscopy), can detect the earliest forms of colorectal cancer. Talk to your health care provider about this at age 50, when routine screening begins.  Direct exam of the colon should be repeated every 5-10 years through age 75 years, unless early forms of pre-cancerous polyps or small growths are found. Hepatitis C blood testing is recommended for all people born from 1945 through 1965 and any individual with known risks for hepatitis C.  Osteoporosis is a disease in which the bones lose minerals and strength with aging. This can result in serious bone fractures or breaks. The risk of osteoporosis can be identified using a bone density scan. Women ages 65 years and over and women at risk for fractures or osteoporosis should discuss screening with their health care providers. Ask your health care provider whether you should take a calcium supplement or vitamin D to reduce the rate  of osteoporosis. Menopause can be associated with physical symptoms and risks. Hormone replacement therapy is available to decrease symptoms and risks. You should talk to your health care provider about whether hormone replacement therapy is right for you. Use sunscreen. Apply sunscreen liberally and repeatedly throughout the day. You should seek shade when your shadow is shorter than you. Protect yourself by wearing long sleeves, pants, a wide-brimmed hat, and sunglasses year round, whenever you are outdoors. Once a month, do a whole body skin exam, using a mirror to look at the skin on your back. Tell your health care provider of new moles, moles that have irregular borders, moles that are larger than a pencil eraser, or moles that have changed in shape or color. Stay current with required vaccines (immunizations). Influenza vaccine. All adults should be immunized every year. Tetanus, diphtheria, and acellular pertussis (Td, Tdap) vaccine. Pregnant women should receive 1 dose of Tdap vaccine during each pregnancy. The dose should be obtained regardless of the length of time since the last dose. Immunization is preferred during the 27th-36th week of gestation. An adult who has not previously received Tdap or who does not know her vaccine status should receive 1 dose of Tdap. This initial dose should be followed by tetanus and diphtheria toxoids (Td) booster doses every 10 years. Adults with an unknown or incomplete history of completing a 3-dose immunization series with Td-containing vaccines should begin or complete a primary immunization series including a Tdap dose. Adults should receive a Td booster every 10 years. Varicella vaccine. An adult without evidence of immunity to varicella should receive 2 doses or a second dose if she has previously received 1 dose. Pregnant females who do not have evidence of immunity should receive the first dose after pregnancy. This first dose should be obtained before  leaving the health care facility. The second dose should be obtained 4-8 weeks after the first dose. Human papillomavirus (HPV) vaccine. Females aged 13-26 years who have not received the vaccine previously should obtain the 3-dose series. The vaccine is not recommended for use in pregnant females. However, pregnancy testing is not needed before receiving a dose. If a female is found to   be pregnant after receiving a dose, no treatment is needed. In that case, the remaining doses should be delayed until after the pregnancy. Immunization is recommended for any person with an immunocompromised condition through the age of 26 years if she did not get any or all doses earlier. During the 3-dose series, the second dose should be obtained 4-8 weeks after the first dose. The third dose should be obtained 24 weeks after the first dose and 16 weeks after the second dose. Zoster vaccine. One dose is recommended for adults aged 60 years or older unless certain conditions are present. Measles, mumps, and rubella (MMR) vaccine. Adults born before 1957 generally are considered immune to measles and mumps. Adults born in 1957 or later should have 1 or more doses of MMR vaccine unless there is a contraindication to the vaccine or there is laboratory evidence of immunity to each of the three diseases. A routine second dose of MMR vaccine should be obtained at least 28 days after the first dose for students attending postsecondary schools, health care workers, or international travelers. People who received inactivated measles vaccine or an unknown type of measles vaccine during 1963-1967 should receive 2 doses of MMR vaccine. People who received inactivated mumps vaccine or an unknown type of mumps vaccine before 1979 and are at high risk for mumps infection should consider immunization with 2 doses of MMR vaccine. For females of childbearing age, rubella immunity should be determined. If there is no evidence of immunity, females  who are not pregnant should be vaccinated. If there is no evidence of immunity, females who are pregnant should delay immunization until after pregnancy. Unvaccinated health care workers born before 1957 who lack laboratory evidence of measles, mumps, or rubella immunity or laboratory confirmation of disease should consider measles and mumps immunization with 2 doses of MMR vaccine or rubella immunization with 1 dose of MMR vaccine. Pneumococcal 13-valent conjugate (PCV13) vaccine. When indicated, a person who is uncertain of her immunization history and has no record of immunization should receive the PCV13 vaccine. An adult aged 19 years or older who has certain medical conditions and has not been previously immunized should receive 1 dose of PCV13 vaccine. This PCV13 should be followed with a dose of pneumococcal polysaccharide (PPSV23) vaccine. The PPSV23 vaccine dose should be obtained at least 1 or more year(s) after the dose of PCV13 vaccine. An adult aged 19 years or older who has certain medical conditions and previously received 1 or more doses of PPSV23 vaccine should receive 1 dose of PCV13. The PCV13 vaccine dose should be obtained 1 or more years after the last PPSV23 vaccine dose.  Pneumococcal polysaccharide (PPSV23) vaccine. When PCV13 is also indicated, PCV13 should be obtained first. All adults aged 65 years and older should be immunized. An adult younger than age 65 years who has certain medical conditions should be immunized. Any person who resides in a nursing home or long-term care facility should be immunized. An adult smoker should be immunized. People with an immunocompromised condition and certain other conditions should receive both PCV13 and PPSV23 vaccines. People with human immunodeficiency virus (HIV) infection should be immunized as soon as possible after diagnosis. Immunization during chemotherapy or radiation therapy should be avoided. Routine use of PPSV23 vaccine is not  recommended for American Indians, [place] Natives, or people younger than 65 years unless there are medical conditions that require PPSV23 vaccine. When indicated, people who have unknown immunization and have no record of immunization should receive   PPSV23 vaccine. One-time revaccination 5 years after the first dose of PPSV23 is recommended for people aged 19-64 years who have chronic kidney failure, nephrotic syndrome, asplenia, or immunocompromised conditions. People who received 1-2 doses of PPSV23 before age 65 years should receive another dose of PPSV23 vaccine at age 65 years or later if at least 5 years have passed since the previous dose. Doses of PPSV23 are not needed for people immunized with PPSV23 at or after age 65 years.  Preventive Services / Frequency  Ages 40 to 64 years Blood pressure check. Lipid and cholesterol check. Lung cancer screening. / Every year if you are aged 55-80 years and have a 30-pack-year history of smoking and currently smoke or have quit within the past 15 years. Yearly screening is stopped once you have quit smoking for at least 15 years or develop a health problem that would prevent you from having lung cancer treatment. Clinical breast exam.** / Every year after age 40 years.  BRCA-related cancer risk assessment.** / For women who have family members with a BRCA-related cancer (breast, ovarian, tubal, or peritoneal cancers). Mammogram.** / Every year beginning at age 40 years and continuing for as long as you are in good health. Consult with your health care provider. Pap test.** / Every 3 years starting at age 30 years through age 65 or 70 years with a history of 3 consecutive normal Pap tests. HPV screening.** / Every 3 years from ages 30 years through ages 65 to 70 years with a history of 3 consecutive normal Pap tests. Fecal occult blood test (FOBT) of stool. / Every year beginning at age 50 years and continuing until age 75 years. You may not need to do this  test if you get a colonoscopy every 10 years. Flexible sigmoidoscopy or colonoscopy.** / Every 5 years for a flexible sigmoidoscopy or every 10 years for a colonoscopy beginning at age 50 years and continuing until age 75 years. Hepatitis C blood test.** / For all people born from 1945 through 1965 and any individual with known risks for hepatitis C. Skin self-exam. / Monthly. Influenza vaccine. / Every year. Tetanus, diphtheria, and acellular pertussis (Tdap/Td) vaccine.** / Consult your health care provider. Pregnant women should receive 1 dose of Tdap vaccine during each pregnancy. 1 dose of Td every 10 years. Varicella vaccine.** / Consult your health care provider. Pregnant females who do not have evidence of immunity should receive the first dose after pregnancy. Zoster vaccine.** / 1 dose for adults aged 60 years or older. Pneumococcal 13-valent conjugate (PCV13) vaccine.** / Consult your health care provider. Pneumococcal polysaccharide (PPSV23) vaccine.** / 1 to 2 doses if you smoke cigarettes or if you have certain conditions. Meningococcal vaccine.** / Consult your health care provider. Hepatitis A vaccine.** / Consult your health care provider. Hepatitis B vaccine.** / Consult your health care provider. Screening for abdominal aortic aneurysm (AAA)  by ultrasound is recommended for people over 50 who have history of high blood pressure or who are current or former smokers. ++++++++++++++++++ Recommend Adult Low Dose Aspirin or  coated  Aspirin 81 mg daily  To reduce risk of Colon Cancer 40 %,  Skin Cancer 26 % ,  Melanoma 46%  and  Pancreatic cancer 60% +++++++++++++++++++ Vitamin D goal  is between 70-100.  Please make sure that you are taking your Vitamin D as directed.  It is very important as a natural anti-inflammatory  helping hair, skin, and nails, as well as reducing stroke and heart   attack risk.  It helps your bones and helps with mood. It also decreases numerous  cancer risks so please take it as directed.  Low Vit D is associated with a 200-300% higher risk for CANCER  and 200-300% higher risk for HEART   ATTACK  &  STROKE.   ...................................... It is also associated with higher death rate at younger ages,  autoimmune diseases like Rheumatoid arthritis, Lupus, Multiple Sclerosis.    Also many other serious conditions, like depression, Alzheimer's Dementia, infertility, muscle aches, fatigue, fibromyalgia - just to name a few. ++++++++++++++++++ Recommend the book "The END of DIETING" by Dr Joel Fuhrman  & the book "The END of DIABETES " by Dr Joel Fuhrman At [employer] - get book & Audio CD's    Being diabetic has a  300% increased risk for heart attack, stroke, cancer, and alzheimer- type vascular dementia. It is very important that you work harder with diet by avoiding all foods that are white. Avoid white rice (brown & wild rice is OK), white potatoes (sweetpotatoes in moderation is OK), White bread or wheat bread or anything made out of white flour like bagels, donuts, rolls, buns, biscuits, cakes, pastries, cookies, pizza crust, and pasta (made from white flour & egg whites) - vegetarian pasta or spinach or wheat pasta is OK. Multigrain breads like Arnold's or [employer], or multigrain sandwich thins or flatbreads.  Diet, exercise and weight loss can reverse and cure diabetes in the early stages.  Diet, exercise and weight loss is very important in the control and prevention of complications of diabetes which affects every system in your body, ie. Brain - dementia/stroke, eyes - glaucoma/blindness, heart - heart attack/heart failure, kidneys - dialysis, stomach - gastric paralysis, intestines - malabsorption, nerves - severe painful neuritis, circulation - gangrene & loss of a leg(s), and finally cancer and Alzheimers.    I recommend avoid fried & greasy foods,  sweets/candy, white rice (brown or wild rice or Quinoa is OK), white  potatoes (sweet potatoes are OK) - anything made from white flour - bagels, doughnuts, rolls, buns, biscuits,white and wheat breads, pizza crust and traditional pasta made of white flour & egg white(vegetarian pasta or spinach or wheat pasta is OK).  Multi-grain bread is OK - like multi-grain flat bread or sandwich thins. Avoid alcohol in excess. Exercise is also important.    Eat all the vegetables you want - avoid meat, especially red meat and dairy - especially cheese.  Cheese is the most concentrated form of trans-fats which is the worst thing to clog up our arteries. Veggie cheese is OK which can be found in the fresh produce section at [employer] or [employer] or [employer]  ++++++++++++++++++++++ DASH Eating Plan  DASH stands for "Dietary Approaches to Stop Hypertension."   The DASH eating plan is a healthy eating plan that has been shown to reduce high blood pressure (hypertension). Additional health benefits may include reducing the risk of type 2 diabetes mellitus, heart disease, and stroke. The DASH eating plan may also help with weight loss. WHAT DO I NEED TO KNOW ABOUT THE DASH EATING PLAN? For the DASH eating plan, you will follow these general guidelines: Choose foods with a percent daily value for sodium of less than 5% (as listed on the food label). Use salt-free seasonings or herbs instead of table salt or sea salt. Check with your health care provider or pharmacist before using salt substitutes. Eat lower-sodium products, often labeled as "lower sodium" or "no   salt added." Eat fresh foods. Eat more vegetables, fruits, and low-fat dairy products. Choose whole grains. Look for the word "whole" as the first word in the ingredient list. Choose fish  Limit sweets, desserts, sugars, and sugary drinks. Choose heart-healthy fats. Eat veggie cheese  Eat more home-cooked food and less restaurant, buffet, and fast food. Limit fried foods. Cook foods using methods other than  frying. Limit canned vegetables. If you do use them, rinse them well to decrease the sodium. When eating at a restaurant, ask that your food be prepared with less salt, or no salt if possible.                      WHAT FOODS CAN I EAT? Read Dr Joel Fuhrman's books on The End of Dieting & The End of Diabetes  Grains Whole grain or whole wheat bread. Brown rice. Whole grain or whole wheat pasta. Quinoa, bulgur, and whole grain cereals. Low-sodium cereals. Corn or whole wheat flour tortillas. Whole grain cornbread. Whole grain crackers. Low-sodium crackers.  Vegetables Fresh or frozen vegetables (raw, steamed, roasted, or grilled). Low-sodium or reduced-sodium tomato and vegetable juices. Low-sodium or reduced-sodium tomato sauce and paste. Low-sodium or reduced-sodium canned vegetables.   Fruits All fresh, canned (in natural juice), or frozen fruits.  Protein Products  All fish and seafood.  Dried beans, peas, or lentils. Unsalted nuts and seeds. Unsalted canned beans.  Dairy Low-fat dairy products, such as skim or 1% milk, 2% or reduced-fat cheeses, low-fat ricotta or cottage cheese, or plain low-fat yogurt. Low-sodium or reduced-sodium cheeses.  Fats and Oils Tub margarines without trans fats. Light or reduced-fat mayonnaise and salad dressings (reduced sodium). Avocado. Safflower, olive, or canola oils. Natural peanut or almond butter.  Other Unsalted popcorn and pretzels. The items listed above may not be a complete list of recommended foods or beverages. Contact your dietitian for more options.  ++++++++++++++++++  WHAT FOODS ARE NOT RECOMMENDED? Grains/ White flour or wheat flour White bread. White pasta. White rice. Refined cornbread. Bagels and croissants. Crackers that contain trans fat.  Vegetables  Creamed or fried vegetables. Vegetables in a . Regular canned vegetables. Regular canned tomato sauce and paste. Regular tomato and vegetable juices.  Fruits Dried fruits.  Canned fruit in light or heavy syrup. Fruit juice.  Meat and Other Protein Products Meat in general - RED meat & White meat.  Fatty cuts of meat. Ribs, chicken wings, all processed meats as bacon, sausage, bologna, salami, fatback, hot dogs, bratwurst and packaged luncheon meats.  Dairy Whole or 2% milk, cream, half-and-half, and cream cheese. Whole-fat or sweetened yogurt. Full-fat cheeses or blue cheese. Non-dairy creamers and whipped toppings. Processed cheese, cheese spreads, or cheese curds.  Condiments Onion and garlic salt, seasoned salt, table salt, and sea salt. Canned and packaged gravies. Worcestershire sauce. Tartar sauce. Barbecue sauce. Teriyaki sauce. Soy sauce, including reduced sodium. Steak sauce. Fish sauce. Oyster sauce. Cocktail sauce. Horseradish. Ketchup and mustard. Meat flavorings and tenderizers. Bouillon cubes. Hot sauce. Tabasco sauce. Marinades. Taco seasonings. Relishes.  Fats and Oils Butter, stick margarine, lard, shortening and bacon fat. Coconut, palm kernel, or palm oils. Regular salad dressings.  Pickles and olives. Salted popcorn and pretzels.  The items listed above may not be a complete list of foods and beverages to avoid.   

## 2023-03-01 ENCOUNTER — Ambulatory Visit (INDEPENDENT_AMBULATORY_CARE_PROVIDER_SITE_OTHER): Payer: BC Managed Care – PPO | Admitting: Internal Medicine

## 2023-03-01 ENCOUNTER — Encounter: Payer: Self-pay | Admitting: Internal Medicine

## 2023-03-01 VITALS — BP 130/78 | HR 64 | Temp 97.8°F | Resp 17 | Ht 61.6 in | Wt 159.4 lb

## 2023-03-01 DIAGNOSIS — Z13220 Encounter for screening for lipoid disorders: Secondary | ICD-10-CM | POA: Diagnosis not present

## 2023-03-01 DIAGNOSIS — Z0000 Encounter for general adult medical examination without abnormal findings: Secondary | ICD-10-CM

## 2023-03-01 DIAGNOSIS — G8929 Other chronic pain: Secondary | ICD-10-CM

## 2023-03-01 DIAGNOSIS — E782 Mixed hyperlipidemia: Secondary | ICD-10-CM

## 2023-03-01 DIAGNOSIS — Z1389 Encounter for screening for other disorder: Secondary | ICD-10-CM | POA: Diagnosis not present

## 2023-03-01 DIAGNOSIS — Z79899 Other long term (current) drug therapy: Secondary | ICD-10-CM | POA: Diagnosis not present

## 2023-03-01 DIAGNOSIS — E538 Deficiency of other specified B group vitamins: Secondary | ICD-10-CM

## 2023-03-01 DIAGNOSIS — I700 Atherosclerosis of aorta: Secondary | ICD-10-CM

## 2023-03-01 DIAGNOSIS — Z8249 Family history of ischemic heart disease and other diseases of the circulatory system: Secondary | ICD-10-CM

## 2023-03-01 DIAGNOSIS — Z130 Encounter for screening for diseases of the blood and blood-forming organs and certain disorders involving the immune mechanism: Secondary | ICD-10-CM | POA: Diagnosis not present

## 2023-03-01 DIAGNOSIS — R7309 Other abnormal glucose: Secondary | ICD-10-CM

## 2023-03-01 DIAGNOSIS — R0989 Other specified symptoms and signs involving the circulatory and respiratory systems: Secondary | ICD-10-CM

## 2023-03-01 DIAGNOSIS — R5383 Other fatigue: Secondary | ICD-10-CM

## 2023-03-01 DIAGNOSIS — Z111 Encounter for screening for respiratory tuberculosis: Secondary | ICD-10-CM

## 2023-03-01 DIAGNOSIS — E559 Vitamin D deficiency, unspecified: Secondary | ICD-10-CM

## 2023-03-01 DIAGNOSIS — Z136 Encounter for screening for cardiovascular disorders: Secondary | ICD-10-CM

## 2023-03-01 DIAGNOSIS — Z131 Encounter for screening for diabetes mellitus: Secondary | ICD-10-CM | POA: Diagnosis not present

## 2023-03-01 DIAGNOSIS — Z1211 Encounter for screening for malignant neoplasm of colon: Secondary | ICD-10-CM

## 2023-03-01 DIAGNOSIS — E669 Obesity, unspecified: Secondary | ICD-10-CM

## 2023-03-01 DIAGNOSIS — Z0001 Encounter for general adult medical examination with abnormal findings: Secondary | ICD-10-CM

## 2023-03-01 DIAGNOSIS — E66811 Obesity, class 1: Secondary | ICD-10-CM

## 2023-03-01 MED ORDER — PHENTERMINE HCL 37.5 MG PO TABS: ORAL_TABLET | ORAL | 1 refills | Status: DC

## 2023-03-01 MED ORDER — TOPIRAMATE 100 MG PO TABS
ORAL_TABLET | ORAL | 1 refills | Status: DC
Start: 2023-03-01 — End: 2023-09-29

## 2023-03-01 MED ORDER — GABAPENTIN 600 MG PO TABS: ORAL_TABLET | ORAL | 1 refills | Status: DC

## 2023-03-02 LAB — COMPLETE METABOLIC PANEL WITH GFR
AG Ratio: 1.5 (calc) (ref 1.0–2.5)
ALT: 17 U/L (ref 6–29)
AST: 20 U/L (ref 10–35)
Albumin: 4 g/dL (ref 3.6–5.1)
Alkaline phosphatase (APISO): 77 U/L (ref 37–153)
BUN: 20 mg/dL (ref 7–25)
CO2: 25 mmol/L (ref 20–32)
Calcium: 9.4 mg/dL (ref 8.6–10.4)
Chloride: 106 mmol/L (ref 98–110)
Creat: 0.61 mg/dL (ref 0.50–1.05)
Globulin: 2.7 g/dL (calc) (ref 1.9–3.7)
Glucose, Bld: 87 mg/dL (ref 65–99)
Potassium: 4.7 mmol/L (ref 3.5–5.3)
Sodium: 141 mmol/L (ref 135–146)
Total Bilirubin: 0.5 mg/dL (ref 0.2–1.2)
Total Protein: 6.7 g/dL (ref 6.1–8.1)
eGFR: 101 mL/min/{1.73_m2} (ref >=60)

## 2023-03-02 LAB — MAGNESIUM: Magnesium: 2.1 mg/dL (ref 1.5–2.5)

## 2023-03-02 LAB — IRON, TOTAL/TOTAL IRON BINDING CAP
%SAT: 21 % (calc) (ref 16–45)
Iron: 92 ug/dL (ref 45–160)
TIBC: 442 mcg/dL (calc) (ref 250–450)

## 2023-03-02 LAB — VITAMIN D 25 HYDROXY (VIT D DEFICIENCY, FRACTURES): Vit D, 25-Hydroxy: >150 ng/mL — ABNORMAL HIGH (ref 30–100)

## 2023-03-02 LAB — CBC WITH DIFFERENTIAL/PLATELET
Absolute Monocytes: 424 cells/uL (ref 200–950)
Basophils Absolute: 11 cells/uL (ref 0–200)
Basophils Relative: 0.2 %
Eosinophils Absolute: 392 cells/uL (ref 15–500)
Eosinophils Relative: 7.4 %
HCT: 41.4 % (ref 35.0–45.0)
Hemoglobin: 13.4 g/dL (ref 11.7–15.5)
Lymphs Abs: 875 cells/uL (ref 850–3900)
MCH: 27.3 pg (ref 27.0–33.0)
MCHC: 32.4 g/dL (ref 32.0–36.0)
MCV: 84.5 fL (ref 80.0–100.0)
MPV: 11.2 fL (ref 7.5–12.5)
Monocytes Relative: 8 %
Neutro Abs: 3599 cells/uL (ref 1500–7800)
Neutrophils Relative %: 67.9 %
Platelets: 273 10*3/uL (ref 140–400)
RBC: 4.9 10*6/uL (ref 3.80–5.10)
RDW: 13.2 % (ref 11.0–15.0)
Total Lymphocyte: 16.5 %
WBC: 5.3 10*3/uL (ref 3.8–10.8)

## 2023-03-02 LAB — URINALYSIS, ROUTINE W REFLEX MICROSCOPIC
Bilirubin Urine: NEGATIVE
Glucose, UA: NEGATIVE
Hgb urine dipstick: NEGATIVE
Ketones, ur: NEGATIVE
Leukocytes,Ua: NEGATIVE
Nitrite: NEGATIVE
Protein, ur: NEGATIVE
Specific Gravity, Urine: 1.026 (ref 1.001–1.035)
pH: 5.5 (ref 5.0–8.0)

## 2023-03-02 LAB — LIPID PANEL
Cholesterol: 156 mg/dL (ref <200)
HDL: 56 mg/dL (ref >=50)
LDL Cholesterol (Calc): 82 mg/dL (calc)
Non-HDL Cholesterol (Calc): 100 mg/dL (calc) (ref <130)
Total CHOL/HDL Ratio: 2.8 (calc) (ref <5.0)
Triglycerides: 85 mg/dL (ref <150)

## 2023-03-02 LAB — MICROALBUMIN / CREATININE URINE RATIO
Creatinine, Urine: 126 mg/dL (ref 20–275)
Microalb Creat Ratio: 6 mg/g creat (ref <30)
Microalb, Ur: 0.7 mg/dL

## 2023-03-02 LAB — TSH: TSH: 2.94 mIU/L (ref 0.40–4.50)

## 2023-03-02 LAB — HEMOGLOBIN A1C
Hgb A1c MFr Bld: 5.4 % of total Hgb (ref <5.7)
Mean Plasma Glucose: 108 mg/dL
eAG (mmol/L): 6 mmol/L

## 2023-03-02 LAB — INSULIN, RANDOM: Insulin: 18 u[IU]/mL

## 2023-03-02 LAB — VITAMIN B12: Vitamin B-12: >2000 pg/mL — ABNORMAL HIGH (ref 200–1100)

## 2023-03-02 NOTE — Progress Notes (Signed)
<><><><><><><><><><><><><><><><><><><><><><><><><><><><><><><><><> <><><><><><><><><><><><><><><><><><><><><><><><><><><><><><><><><> -   Test results slightly outside the reference range are not unusual. If there is anything important, I will review this with you,  otherwise it is considered normal test values.  If you have further questions,  please do not hesitate to contact me at the office or via My Chart.  <><><><><><><><><><><><><><><><><><><><><><><><><><><><><><><><><> <><><><><><><><><><><><><><><><><><><><><><><><><><><><><><><><><>  -  Vitamin B12 level is VERY high , So please cut dose down to 1 x /week  <><><><><><><><><><><><><><><><><><><><><><><><><><><><><><><><><>  -  Vitamin D Level  is Also  way too high !   - Recommend stop all Vitamin D for 1 week &                                                  discard the 50,000 unit dose and                                                                                  replace with Vitamin 5,000 unit capsules   - After 1 week you can start taking 5,000 unit  only 1 capsule Daily  <><><><><><><><><><><><><><><><><><><><><><><><><><><><><><><><><> <><><><><><><><><><><><><><><><><><><><><><><><><><><><><><><><><>  -  A1c - Normal -No Diabetes  - Great !  <><><><><><><><><><><><><><><><><><><><><><><><><><><><><><><><><>  -  Chol = 156    &   LDL 82   - Both  Excellent   - Very low risk for Heart Attack  / Stroke <><><><><><><><><><><><><><><><><><><><><><><><><><><><><><><><><>  -  A1c = 5.4% - Normal- No Diabetes  - Great  ! <><><><><><><><><><><><><><><><><><><><><><><><><><><><><><><><><>  -  All Else - CBC - Kidneys - Electrolytes - Liver - Magnesium & Thyroid    - all  Normal / OK ===========================================================

## 2023-03-10 ENCOUNTER — Other Ambulatory Visit: Payer: Self-pay

## 2023-03-10 DIAGNOSIS — Z1211 Encounter for screening for malignant neoplasm of colon: Secondary | ICD-10-CM

## 2023-03-10 LAB — POC HEMOCCULT BLD/STL (HOME/3-CARD/SCREEN)
Card #2 Fecal Occult Blod, POC: NEGATIVE
Card #3 Fecal Occult Blood, POC: NEGATIVE
Fecal Occult Blood, POC: NEGATIVE

## 2023-03-11 DIAGNOSIS — Z1212 Encounter for screening for malignant neoplasm of rectum: Secondary | ICD-10-CM | POA: Diagnosis not present

## 2023-03-11 DIAGNOSIS — Z1211 Encounter for screening for malignant neoplasm of colon: Secondary | ICD-10-CM | POA: Diagnosis not present

## 2023-07-24 ENCOUNTER — Emergency Department (HOSPITAL_COMMUNITY)
Admission: EM | Admit: 2023-07-24 | Discharge: 2023-07-25 | Disposition: A | Discharge status: Home / Self Care | Payer: BC Managed Care – PPO | Attending: Emergency Medicine | Admitting: Emergency Medicine

## 2023-07-24 DIAGNOSIS — R569 Unspecified convulsions: Secondary | ICD-10-CM | POA: Insufficient documentation

## 2023-07-24 DIAGNOSIS — R404 Transient alteration of awareness: Secondary | ICD-10-CM | POA: Diagnosis not present

## 2023-07-24 DIAGNOSIS — R4182 Altered mental status, unspecified: Secondary | ICD-10-CM | POA: Diagnosis not present

## 2023-07-24 DIAGNOSIS — I517 Cardiomegaly: Secondary | ICD-10-CM | POA: Diagnosis not present

## 2023-07-24 DIAGNOSIS — I469 Cardiac arrest, cause unspecified: Secondary | ICD-10-CM | POA: Diagnosis not present

## 2023-07-24 DIAGNOSIS — I771 Stricture of artery: Secondary | ICD-10-CM | POA: Diagnosis not present

## 2023-07-24 DIAGNOSIS — Z7982 Long term (current) use of aspirin: Secondary | ICD-10-CM | POA: Diagnosis not present

## 2023-07-24 DIAGNOSIS — Z91040 Latex allergy status: Secondary | ICD-10-CM | POA: Insufficient documentation

## 2023-07-24 DIAGNOSIS — I700 Atherosclerosis of aorta: Secondary | ICD-10-CM | POA: Diagnosis not present

## 2023-07-24 DIAGNOSIS — I10 Essential (primary) hypertension: Secondary | ICD-10-CM | POA: Diagnosis not present

## 2023-07-24 NOTE — ED Triage Notes (Signed)
Pt brought by EMS from home for seizure lasting 60 seconds. Hx of seizures. Not on seizure meds. No seizure in past 3 years. Pt began having multiple short seizures after EMS arrival. 5mg  Versed given initially and then an additional 2.5mg  at 2309. No seizure activity since 2315. Postictal since. Rescue breathing given briefly during third seizure. Pt on 15L nonrebreather. EMS reports patient postictal and not responding to stimuli on site.

## 2023-07-25 ENCOUNTER — Emergency Department (HOSPITAL_COMMUNITY): Payer: BC Managed Care – PPO

## 2023-07-25 ENCOUNTER — Other Ambulatory Visit: Payer: Self-pay

## 2023-07-25 ENCOUNTER — Encounter (HOSPITAL_COMMUNITY): Payer: Self-pay

## 2023-07-25 DIAGNOSIS — I700 Atherosclerosis of aorta: Secondary | ICD-10-CM | POA: Diagnosis not present

## 2023-07-25 DIAGNOSIS — I771 Stricture of artery: Secondary | ICD-10-CM | POA: Diagnosis not present

## 2023-07-25 DIAGNOSIS — R569 Unspecified convulsions: Secondary | ICD-10-CM | POA: Diagnosis not present

## 2023-07-25 DIAGNOSIS — I517 Cardiomegaly: Secondary | ICD-10-CM | POA: Diagnosis not present

## 2023-07-25 LAB — URINALYSIS, ROUTINE W REFLEX MICROSCOPIC
Bacteria, UA: NONE SEEN
Bilirubin Urine: NEGATIVE
Glucose, UA: NEGATIVE mg/dL
Hgb urine dipstick: NEGATIVE
Ketones, ur: NEGATIVE mg/dL
Nitrite: NEGATIVE
Protein, ur: NEGATIVE mg/dL
Specific Gravity, Urine: 1.002 — ABNORMAL LOW (ref 1.005–1.030)
pH: 6 (ref 5.0–8.0)

## 2023-07-25 LAB — CBC WITH DIFFERENTIAL/PLATELET
Abs Immature Granulocytes: 0.01 10*3/uL (ref 0.00–0.07)
Basophils Absolute: 0 10*3/uL (ref 0.0–0.1)
Basophils Relative: 0 %
Eosinophils Absolute: 0.4 10*3/uL (ref 0.0–0.5)
Eosinophils Relative: 6 %
HCT: 42.6 % (ref 36.0–46.0)
Hemoglobin: 13.6 g/dL (ref 12.0–15.0)
Immature Granulocytes: 0 %
Lymphocytes Relative: 25 %
Lymphs Abs: 1.4 10*3/uL (ref 0.7–4.0)
MCH: 29.6 pg (ref 26.0–34.0)
MCHC: 31.9 g/dL (ref 30.0–36.0)
MCV: 92.8 fL (ref 80.0–100.0)
Monocytes Absolute: 0.5 10*3/uL (ref 0.1–1.0)
Monocytes Relative: 10 %
Neutro Abs: 3.2 10*3/uL (ref 1.7–7.7)
Neutrophils Relative %: 59 %
Platelets: 197 10*3/uL (ref 150–400)
RBC: 4.59 MIL/uL (ref 3.87–5.11)
RDW: 13.9 % (ref 11.5–15.5)
WBC: 5.5 10*3/uL (ref 4.0–10.5)
nRBC: 0 % (ref 0.0–0.2)

## 2023-07-25 LAB — RAPID URINE DRUG SCREEN, HOSP PERFORMED
Amphetamines: NOT DETECTED
Barbiturates: NOT DETECTED
Benzodiazepines: POSITIVE — AB
Cocaine: NOT DETECTED
Opiates: NOT DETECTED
Tetrahydrocannabinol: NOT DETECTED

## 2023-07-25 LAB — COMPREHENSIVE METABOLIC PANEL WITH GFR
ALT: 20 U/L (ref 0–44)
AST: 20 U/L (ref 15–41)
Albumin: 3.5 g/dL (ref 3.5–5.0)
Alkaline Phosphatase: 63 U/L (ref 38–126)
Anion gap: 14 (ref 5–15)
BUN: 15 mg/dL (ref 8–23)
CO2: 20 mmol/L — ABNORMAL LOW (ref 22–32)
Calcium: 8.6 mg/dL — ABNORMAL LOW (ref 8.9–10.3)
Chloride: 109 mmol/L (ref 98–111)
Creatinine, Ser: 0.65 mg/dL (ref 0.44–1.00)
GFR, Estimated: >60 mL/min (ref >60)
Glucose, Bld: 94 mg/dL (ref 70–99)
Potassium: 3.2 mmol/L — ABNORMAL LOW (ref 3.5–5.1)
Sodium: 143 mmol/L (ref 135–145)
Total Bilirubin: 0.4 mg/dL (ref 0.3–1.2)
Total Protein: 6.3 g/dL — ABNORMAL LOW (ref 6.5–8.1)

## 2023-07-25 LAB — MAGNESIUM: Magnesium: 2.4 mg/dL (ref 1.7–2.4)

## 2023-07-25 LAB — CBG MONITORING, ED: Glucose-Capillary: 82 mg/dL (ref 70–99)

## 2023-07-25 MED ORDER — LEVETIRACETAM IN NACL 1500 MG/100ML IV SOLN
1500.0000 mg | Freq: Once | INTRAVENOUS | Status: AC
  Administered 2023-07-25: 1500 mg via INTRAVENOUS
  Filled 2023-07-25: qty 100

## 2023-07-25 MED ORDER — LEVETIRACETAM 500 MG PO TABS: 500.0000 mg | ORAL_TABLET | Freq: Two times a day (BID) | ORAL | 1 refills | Status: DC

## 2023-07-25 NOTE — ED Provider Notes (Signed)
[place] EMERGENCY DEPARTMENT AT [facility] Provider Note   [name]: [id number] Arrival date & time: 07/24/23  2354     History  Chief Complaint  Patient presents with   Seizures    Emily Graham is a 62 y.o. female.  The history is provided by the EMS personnel and medical records.  Seizures Emily Graham is a 62 y.o. female who presents to the Emergency Department complaining of seizures.  She presents to the emergency department by EMS for evaluation following seizure activity.  Per EMS husband witnessed a 62nd generalized tonic-clonic seizure.  On EMS arrival she was found to be postictal.  Then for EMS that she had about 5 minutes of generalized tonic-clonic activity and received 5 mg of IM midazolam.  She had ongoing roving eye movements but no more generalized activity after administration of the midazolam.  She then had a second episode of generalized tonic clonic activity and received 2.5 mg IV midazolam.  Last administration was at 2309.  She was a GCS of 3 after administration of medications.  Patient began to open eyes and moan just prior to ED arrival.  She does have a remote history of seizure disorder but has not had an event in 3 years and is not currently on medications.     Home Medications Prior to Admission medications   Medication Sig Start Date End Date Taking? Authorizing Provider  levETIRAcetam (KEPPRA) 500 MG tablet Take 1 tablet (500 mg total) by mouth 2 (two) times daily. 07/25/23  Yes Tilden Fossa, MD  albuterol (VENTOLIN HFA) 108 (90 Base) MCG/ACT inhaler Inhale 1-2 puffs into the lungs every 6 (six) hours as needed for wheezing or shortness of breath. 02/18/22   Lucky Cowboy, MD  Arginine 500 MG CAPS Take 500 mg by mouth daily.    [provider]  aspirin 81 MG tablet Take 81 mg by mouth daily.    [provider]  b complex vitamins tablet Take 1 tablet by mouth daily.    [provider]  Cyanocobalamin (B-12) 5000  MCG SUBL Place under the tongue.    [provider]  gabapentin (NEURONTIN) 600 MG tablet Take  1/2 to 1 tablet  3 x /day  as needed for Pain 03/01/23   Lucky Cowboy, MD  IRON, FERROUS SULFATE, PO Take 65 mg by mouth daily.    [provider]  loratadine (CLARITIN) 10 MG tablet Take 10 mg by mouth daily.    [provider]  milk thistle 175 MG tablet Take 175 mg by mouth daily.    [provider]  Multiple Vitamin (MULTIVITAMIN) tablet Take 1 tablet by mouth daily.     [provider]  Multiple Vitamins-Minerals (HAIR SKIN AND NAILS FORMULA PO) Take 1 tablet by mouth daily. 08/02/19   [provider]  Omega-3 Fatty Acids (FISH OIL PO) Take 1 capsule by mouth daily.    [provider]  phentermine (ADIPEX-P) 37.5 MG tablet Take 1/2 to 1 tablet every Morning for Dieting & Weight Loss 03/01/23   Lucky Cowboy, MD  polycarbophil (FIBERCON) 625 MG tablet Take 625 mg by mouth daily.    [provider]  Probiotic CAPS Take 1 capsule by mouth daily.    [provider]  topiramate (TOPAMAX) 100 MG tablet Take 1/2 to 1 tablet  at Bedtime for Dieting & Weight Loss 03/01/23   Lucky Cowboy, MD  TURMERIC PO Take 1,500 mg by mouth 2 (two) times daily.  [provider]  Vitamin D, Ergocalciferol, (DRISDOL) 50000 units CAPS capsule Take 1 capsule (50,000 Units total) by mouth daily. 06/23/18   Lucky Cowboy, MD      Allergies    Latex    Review of Systems   Review of Systems  Neurological:  Positive for seizures.  All other systems reviewed and are negative.   Physical Exam Updated Vital Signs BP 102/74   Pulse 73   Temp 97.7 F (36.5 C) (Oral)   Resp 13   Ht 5\' 2"  (1.575 m)   Wt 72.3 kg   SpO2 100%   BMI 29.15 kg/m  Physical Exam Vitals and nursing note reviewed.  Constitutional:      General: She is in acute distress.     Appearance: She is well-developed.  HENT:     Head: Normocephalic  and atraumatic.  Cardiovascular:     Rate and Rhythm: Normal rate and regular rhythm.     Heart sounds: No murmur heard. Pulmonary:     Effort: Pulmonary effort is normal. No respiratory distress.     Breath sounds: Normal breath sounds.  Abdominal:     Palpations: Abdomen is soft.     Tenderness: There is no abdominal tenderness. There is no guarding or rebound.  Musculoskeletal:        General: No tenderness.  Skin:    General: Skin is warm and dry.  Neurological:     Comments: Opens eyes to verbal stimuli.  Mumbling speech.  Does not follow commands.  No response to pain in all 4 extremities.  Psychiatric:     Comments: Unable to assess     ED Results / Procedures / Treatments   Labs (all labs ordered are listed, but only abnormal results are displayed) Labs Reviewed  COMPREHENSIVE METABOLIC PANEL - Abnormal; Notable for the following components:      Result Value   Potassium 3.2 (*)    CO2 20 (*)    Calcium 8.6 (*)    Total Protein 6.3 (*)    All other components within normal limits  RAPID URINE DRUG SCREEN, HOSP PERFORMED - Abnormal; Notable for the following components:   Benzodiazepines POSITIVE (*)    All other components within normal limits  URINALYSIS, ROUTINE W REFLEX MICROSCOPIC - Abnormal; Notable for the following components:   Color, Urine COLORLESS (*)    Specific Gravity, Urine 1.002 (*)    Leukocytes,Ua MODERATE (*)    All other components within normal limits  CBC WITH DIFFERENTIAL/PLATELET  MAGNESIUM  CBG MONITORING, ED    EKG EKG Interpretation Date/Time:  Sunday July 25 2023 00:07:28 EDT Ventricular Rate:  74 PR Interval:  145 QRS Duration:  110 QT Interval:  430 QTC Calculation: 478 R Axis:   55  Text Interpretation: Sinus rhythm Confirmed by Tilden Fossa [phone]) on 07/25/2023 12:52:19 AM  Radiology CT Head Wo Contrast  Result Date: 07/25/2023 CLINICAL DATA:  New onset seizure EXAM: CT HEAD WITHOUT CONTRAST TECHNIQUE: Contiguous  axial images were obtained from the base of the skull through the vertex without intravenous contrast. RADIATION DOSE REDUCTION: This exam was performed according to the departmental dose-optimization program which includes automated exposure control, adjustment of the mA and/or kV according to patient size and/or use of iterative reconstruction technique. COMPARISON:  07/18/2022 FINDINGS: Brain: No evidence of acute infarction, hemorrhage, hydrocephalus, extra-axial collection or mass lesion/mass effect. Vascular: No hyperdense vessel or unexpected calcification. Skull: Normal. Negative for fracture or focal lesion. Sinuses/Orbits: The visualized  paranasal sinuses are essentially clear. The mastoid air cells are unopacified. Other: None. IMPRESSION: Normal head CT. Electronically Signed   By: Charline Bills M.D.   On: 07/25/2023 01:09   DG Chest Portable 1 View  Result Date: 07/25/2023 CLINICAL DATA:  Seizure. EXAM: PORTABLE CHEST 1 VIEW COMPARISON:  Portable chest 10/22/2021 FINDINGS: Multiple overlying monitor wires. There is mild cardiomegaly without evidence of CHF. Stable mediastinum with mild aortic tortuosity and atherosclerosis. The sulci are sharp. The lungs are expiratory. No obvious focal pneumonia is seen but there is limited evaluation of the lower lung fields due to low inspiration. Osteopenia and degenerative change thoracic spine with right shoulder flush. No new osseous findings. IMPRESSION: Expiratory chest with mild cardiomegaly. No evidence of acute chest process. Limited evaluation of the lower lung fields due to low inspiration. Electronically Signed   By: Almira Bar M.D.   On: 07/25/2023 00:18    Procedures Procedures    Medications Ordered in ED Medications  levETIRAcetam (KEPPRA) IVPB 1500 mg/ 100 mL premix (0 mg Intravenous Stopped 07/25/23 0111)    ED Course/ Medical Decision Making/ A&P                                 Medical Decision Making Amount and/or  Complexity of Data Reviewed Labs: ordered. Radiology: ordered.  Risk Prescription drug management.   Patient with history of possible seizure disorder here for evaluation for seizure activity.  She did have 2 back-to-back seizures for EMS.  She did have a normal glucose for EMS.  She did receive midazolam prior to arrival.  She was observed for several hours as she was initially very somnolent.  During her ED stay her mental status did clear.  She was treated with a Keppra load.  Labs with mild hypokalemia, otherwise unrevealing.  Discussed with on-call neurologist-recommendation for initiation of seizure medicine at time of discharge. Discussed with patient that she did have seizures today and that she needs to not drive a vehicle or operate heavy machinery for at least 6 months and she should follow-up with neurology.  Discussed recommendation of starting seizure medication.  Patient is reluctant to start medication.  Discussed risks of not starting medication.        Final Clinical Impression(s) / ED Diagnoses Final diagnoses:  Seizure [facility])    Rx / DC Orders ED Discharge Orders          Ordered    levETIRAcetam (KEPPRA) 500 MG tablet  2 times daily        07/25/23 0630              Tilden Fossa, MD 07/25/23 [phone]

## 2023-09-16 ENCOUNTER — Telehealth: Payer: Self-pay | Admitting: Neurology

## 2023-09-16 MED ORDER — LEVETIRACETAM 500 MG PO TABS: 500.0000 mg | ORAL_TABLET | Freq: Two times a day (BID) | ORAL | 1 refills | Status: DC

## 2023-09-16 NOTE — Telephone Encounter (Signed)
Pt is requesting a refill for levETIRAcetam (KEPPRA) 500 MG tablet to  Pharmacy: [facility]

## 2023-09-16 NOTE — Telephone Encounter (Signed)
refilled 

## 2023-09-29 ENCOUNTER — Ambulatory Visit (INDEPENDENT_AMBULATORY_CARE_PROVIDER_SITE_OTHER): Payer: BC Managed Care – PPO | Admitting: Neurology

## 2023-09-29 ENCOUNTER — Encounter: Payer: Self-pay | Admitting: Neurology

## 2023-09-29 VITALS — BP 118/66 | Wt 143.0 lb

## 2023-09-29 DIAGNOSIS — G40909 Epilepsy, unspecified, not intractable, without status epilepticus: Secondary | ICD-10-CM | POA: Diagnosis not present

## 2023-09-29 MED ORDER — LEVETIRACETAM 500 MG PO TABS: 500.0000 mg | ORAL_TABLET | Freq: Two times a day (BID) | ORAL | 3 refills | Status: DC

## 2023-09-29 MED ORDER — NAYZILAM 5 MG/0.1ML NA SOLN: 5.0000 mg | NASAL | 0 refills | Status: AC | PRN

## 2023-09-29 NOTE — Patient Instructions (Signed)
Continue with Keppra 500 mg twice daily Will check Keppra level Nayzilam for seizure cluster or seizure lasting more than 2 minutes Continue follow-up PCP Return in 1 year or sooner if worse.

## 2023-09-29 NOTE — Progress Notes (Signed)
[facility]  PATIENT: Emily Graham DOB: Mar 24, 1961  REQUESTING CLINICIAN: Lucky Cowboy, MD HISTORY FROM: Patient  REASON FOR VISIT: Seizure like activity/Syncope   HISTORICAL  CHIEF COMPLAINT:  Chief Complaint  Patient presents with   Follow-up    Rm 13, sz follow up, sz on 07/24/2023 was triggered by exhaustion, no missed medications, denies SI/HI   INTERVAL HISTORY 09/29/2023:  Emily Graham presents today for follow-up, she is alone, last visit was in August of last year.  At that time, she had a syncope versus seizure, was not taking the prescribed Keppra.  We obtained a routine EEG which was normal.  She was doing well, off Keppra until August 10 when she did have a seizure out of sleep.  She was told by her husband that she had to have a generalized convulsion.  EMS was called.  Patient also did have 2 additional seizures with EMS and Versed was given.  In the ED she was postictal, she was loaded on Keppra and started on Keppra 500 mg twice daily.  She is compliant with the medication, denies any side effect.  No other complaints, no other concerns and no additional seizures.    HISTORY OF PRESENT ILLNESS:  This is a 62 year old woman with past medical history of seizure disorder, described as generalized seizure, last seizure was more than 10 years ago, asthma who is presenting after seizure-like activity.  Patient reports the day prior to the event on August 4 she was working at [employer], she was on her feet all day long, the next day, the day of the event, she continued to work in [employer]. It was a hot day and she was not eating until about 230 PM.  She reports eating a piece of cake and going back in the kitchen pantry, suddenly felt dizzy, felt hot, asked for help, reported they took her to the [facility], she remember all and this was the last thing that she remembers.   She was told that she passed out total of 3 times, she remembers waking up in the  ambulance with EMS.  Per chart review her initial blood sugar was 79.  In the ED her initial work-up was negative, she was given IV Keppra and discharged home on Keppra. She currently not taking the Keppra. Patient reported this event is different from her typical seizures.  Her typical seizure used to be generalized tonic-clonic seizure with tongue biting, urinary incontinence follow-up with but extreme exertion and muscle ache.  She did not have any of those additional symptoms. In the past, she was on Dilantin, Carbamazepine and Levetiracetam.   Handedness: Right handed   Onset: 07/18/2022, prior to that last seizure was 10 year ago   Seizure Type: Reported a generalized convulsion but this event was described as passing out  Current frequency: August 5, her last event was more than 10 years ago  Any injuries from seizures: Denies   Seizure risk factors: Skull fracture (hairline)  Previous ASMs: Dilantin, Carbamazepine, Keppra   Currenty ASMs: Levetiracetam 500 mg twice daily   ASMs side effects: Denies  Brain Images: Normal CT   Previous EEGs: Not available for review   OTHER MEDICAL CONDITIONS: asthma, seizure   REVIEW OF SYSTEMS: Full 14 system review of systems performed and negative with exception of: As noted in the HPI   ALLERGIES: Allergies  Allergen Reactions   Latex Rash    HOME MEDICATIONS: Outpatient Medications Prior to Visit  Medication Sig Dispense  Refill   albuterol (VENTOLIN HFA) 108 (90 Base) MCG/ACT inhaler Inhale 1-2 puffs into the lungs every 6 (six) hours as needed for wheezing or shortness of breath. 18 g 1   Arginine 500 MG CAPS Take 500 mg by mouth daily.     aspirin 81 MG tablet Take 81 mg by mouth daily.     b complex vitamins tablet Take 1 tablet by mouth daily.     Cyanocobalamin (B-12) 5000 MCG SUBL Place under the tongue.     gabapentin (NEURONTIN) 600 MG tablet Take  1/2 to 1 tablet  3 x /day  as needed for Pain 270 tablet 1   IRON,  FERROUS SULFATE, PO Take 65 mg by mouth daily.     loratadine (CLARITIN) 10 MG tablet Take 10 mg by mouth daily.     milk thistle 175 MG tablet Take 175 mg by mouth daily.     Multiple Vitamin (MULTIVITAMIN) tablet Take 1 tablet by mouth daily.      Multiple Vitamins-Minerals (HAIR SKIN AND NAILS FORMULA PO) Take 1 tablet by mouth daily.     Omega-3 Fatty Acids (FISH OIL PO) Take 1 capsule by mouth daily.     polycarbophil (FIBERCON) 625 MG tablet Take 625 mg by mouth daily.     Probiotic CAPS Take 1 capsule by mouth daily.     TURMERIC PO Take 1,500 mg by mouth 2 (two) times daily.     Vitamin D, Ergocalciferol, (DRISDOL) 50000 units CAPS capsule Take 1 capsule (50,000 Units total) by mouth daily. 90 capsule 1   levETIRAcetam (KEPPRA) 500 MG tablet Take 1 tablet (500 mg total) by mouth 2 (two) times daily. 60 tablet 1   phentermine (ADIPEX-P) 37.5 MG tablet Take 1/2 to 1 tablet every Morning for Dieting & Weight Loss 90 tablet 1   topiramate (TOPAMAX) 100 MG tablet Take 1/2 to 1 tablet  at Bedtime for Dieting & Weight Loss 90 tablet 1   No facility-administered medications prior to visit.    PAST MEDICAL HISTORY: Past Medical History:  Diagnosis Date   ADD (attention deficit disorder) 07/19/2013   Arthritis    Cholecystitis, acute with cholelithiasis 07/19/2013   Chronic left shoulder pain 07/19/2013   Dyspnea    GERD (gastroesophageal reflux disease) 07/19/2013   Goiter    HTN (hypertension)    Hypoglycemia    Hypoglycemia 07/19/2013   Murmur    Pre-diabetes    Seizure disorder, primary generalized (HCC) 07/19/2013   May be related to hypoglycemia   Seizures (HCC) last 01/08/18   2013-grand mal due to hypoglycemia   Sleep apnea    "mild"-not using CPAP per pt    PAST SURGICAL HISTORY: Past Surgical History:  Procedure Laterality Date   ABDOMINAL HYSTERECTOMY     BREAST ENHANCEMENT SURGERY     CHOLECYSTECTOMY N/A 07/20/2013   Procedure: LAPAROSCOPIC CHOLECYSTECTOMY WITH INTRAOPERATIVE  CHOLANGIOGRAM;  Surgeon: Kandis Cocking, MD;  Location: [facility];  Service: General;  Laterality: N/A;   GASTRIC BYPASS     GASTRIC BYPASS     GASTRIC BYPASS  2000   leg lift  2008   had aspiration in lungs, fluid was "drained" per pt/this was done at outpatient center/RM   Stomach tuck     TOTAL SHOULDER REPLACEMENT      FAMILY HISTORY: Family History  Problem Relation Age of Onset   Coronary artery disease Father        MI at age 59   Heart  disease Father    Hypertension Father    Diabetes Father    Skin cancer Mother    Hypertension Mother    Hyperlipidemia Mother    Breast cancer Maternal Aunt    Cancer Maternal Aunt    Breast cancer Paternal Aunt    Breast cancer Paternal Uncle    Cancer Cousin    Colon cancer Neg Hx     SOCIAL HISTORY: Social History   Socioeconomic History   Marital status: Married    Spouse name: Not on file   Number of children: 2   Years of education: Not on file   Highest education level: Not on file  Occupational History    Employer: UNEMPLOYED  Tobacco Use   Smoking status: Never   Smokeless tobacco: Never  Vaping Use   Vaping status: Never Used  Substance and Sexual Activity   Alcohol use: Yes    Alcohol/week: 3.0 standard drinks of alcohol    Types: 1 Standard drinks or equivalent, 2 Glasses of wine per week    Comment: Occasional/social    Drug use: No   Sexual activity: Yes  Other Topics Concern   Not on file  Social History Narrative   Right handed   Lives with husband   Caffeine - 1 cup daily   Social Determinants of Health   Financial Resource Strain: Not on file  Food Insecurity: Not on file  Transportation Needs: No Transportation Needs (11/27/2022)   PRAPARE - Administrator, Civil Service (Medical): No    Lack of Transportation (Non-Medical): No  Physical Activity: Not on file  Stress: Not on file  Social Connections: Not on file  Intimate Partner Violence: Not on file    PHYSICAL  EXAM  GENERAL EXAM/CONSTITUTIONAL: Vitals:  Vitals:   09/29/23 1323  BP: 118/66  Weight: 143 lb (64.9 kg)   Body mass index is 26.16 kg/m. Wt Readings from Last 3 Encounters:  09/29/23 143 lb (64.9 kg)  07/25/23 159 lb 6.3 oz (72.3 kg)  03/01/23 159 lb 6.4 oz (72.3 kg)   Patient is in no distress; well developed, nourished and groomed; neck is supple  MUSCULOSKELETAL: Gait, strength, tone, movements noted in Neurologic exam below  NEUROLOGIC: MENTAL STATUS:      No data to display         awake, alert, oriented to person, place and time recent and remote memory intact normal attention and concentration language fluent, comprehension intact, naming intact fund of knowledge appropriate  CRANIAL NERVE:  2nd, 3rd, 4th, 6th - visual fields full to confrontation, extraocular muscles intact, no nystagmus 5th - facial sensation symmetric 7th - facial strength symmetric 8th - hearing intact 9th - palate elevates symmetrically, uvula midline 11th - shoulder shrug symmetric 12th - tongue protrusion midline  MOTOR:  normal bulk and tone, full strength in the BUE, BLE  SENSORY:  normal and symmetric to light touch  COORDINATION:  finger-nose-finger, fine finger movements normal  GAIT/STATION:  normal   DIAGNOSTIC DATA (LABS, IMAGING, TESTING) - I reviewed patient records, labs, notes, testing and imaging myself where available.  Lab Results  Component Value Date   WBC 5.5 07/25/2023   HGB 13.6 07/25/2023   HCT 42.6 07/25/2023   MCV 92.8 07/25/2023   PLT 197 07/25/2023      Component Value Date/Time   NA 143 07/25/2023 0010   K 3.2 (L) 07/25/2023 0010   CL 109 07/25/2023 0010   CO2 20 (L) 07/25/2023 0010  GLUCOSE 94 07/25/2023 0010   BUN 15 07/25/2023 0010   CREATININE 0.65 07/25/2023 0010   CREATININE 0.61 03/01/2023 1000   CALCIUM 8.6 (L) 07/25/2023 0010   PROT 6.3 (L) 07/25/2023 0010   ALBUMIN 3.5 07/25/2023 0010   AST 20 07/25/2023 0010   ALT  20 07/25/2023 0010   ALKPHOS 63 07/25/2023 0010   BILITOT 0.4 07/25/2023 0010   GFRNONAA >60 07/25/2023 0010   GFRNONAA 87 02/11/2021 1500   GFRAA 100 02/11/2021 1500   Lab Results  Component Value Date   CHOL 156 03/01/2023   HDL 56 03/01/2023   LDLCALC 82 03/01/2023   TRIG 85 03/01/2023   Lab Results  Component Value Date   HGBA1C 5.4 03/01/2023   Lab Results  Component Value Date   VITAMINB12 >2,000 (H) 03/01/2023   Lab Results  Component Value Date   TSH 2.94 03/01/2023    Head CT 07/18/2022 No acute intracranial abnormality.  Head CT 07/25/2023 Normal head CT   Routine EEG 08/11/2022 Normal   ASSESSMENT AND PLAN  62 y.o. year old female  with history of generalized seizure more than 10 years ago, asthma, who is presenting after a seizure on August 10, generalized convulsion.  She did have subsequently 2 additional seizures with EMS and Versed were given.  Patient was started on Keppra 500 mg twice daily, she is doing well, denies any seizure or seizure-like activity and she denies any side effect from the medication.  Plan for now is to continue patient on Keppra 500 mg twice daily and to continue monitor monitoring her.  She understands to contact me if she does have a breakthrough seizure.  I will also give her a rescue medication, Nayzilam.  I will see her in 1 year for follow-up or sooner if worse.   1. Seizure disorder [facility])     Patient Instructions  Continue with Keppra 500 mg twice daily Will check Keppra level Nayzilam for seizure cluster or seizure lasting more than 2 minutes Continue follow-up PCP Return in 1 year or sooner if worse.   Per [facility] statutes, patients with seizures are not allowed to drive until they have been seizure-free for six months.  Other recommendations include using caution when using heavy equipment or power tools. Avoid working on ladders or at heights. Take showers instead of baths.  Do not swim alone.  Ensure the  water temperature is not too high on the home water heater. Do not go swimming alone. Do not lock yourself in a room alone (i.e. bathroom). When caring for infants or small children, sit down when holding, feeding, or changing them to minimize risk of injury to the child in the event you have a seizure. Maintain good sleep hygiene. Avoid alcohol.  Also recommend adequate sleep, hydration, good diet and minimize stress.   During the Seizure  - First, ensure adequate ventilation and place patients on the floor on their left side  Loosen clothing around the neck and ensure the airway is patent. If the patient is clenching the teeth, do not force the mouth open with any object as this can cause severe damage - Remove all items from the surrounding that can be hazardous. The patient may be oblivious to what's happening and may not even know what he or she is doing. If the patient is confused and wandering, either gently guide him/her away and block access to outside areas - Reassure the individual and be comforting - Call 911. In  most cases, the seizure ends before EMS arrives. However, there are cases when seizures may last over 3 to 5 minutes. Or the individual may have developed breathing difficulties or severe injuries. If a pregnant patient or a person with diabetes develops a seizure, it is prudent to call an ambulance. - Finally, if the patient does not regain full consciousness, then call EMS. Most patients will remain confused for about 45 to 90 minutes after a seizure, so you must use judgment in calling for help. - Avoid restraints but make sure the patient is in a bed with padded side rails - Place the individual in a lateral position with the neck slightly flexed; this will help the saliva drain from the mouth and prevent the tongue from falling backward - Remove all nearby furniture and other hazards from the area - Provide verbal assurance as the individual is regaining consciousness -  Provide the patient with privacy if possible - Call for help and start treatment as ordered by the caregiver   After the Seizure (Postictal Stage)  After a seizure, most patients experience confusion, fatigue, muscle pain and/or a headache. Thus, one should permit the individual to sleep. For the next few days, reassurance is essential. Being calm and helping reorient the person is also of importance.  Most seizures are painless and end spontaneously. Seizures are not harmful to others but can lead to complications such as stress on the lungs, brain and the heart. Individuals with prior lung problems may develop labored breathing and respiratory distress.     Orders Placed This Encounter  Procedures   Levetiracetam level    Meds ordered this encounter  Medications   levETIRAcetam (KEPPRA) 500 MG tablet    Sig: Take 1 tablet (500 mg total) by mouth 2 (two) times daily.    Dispense:  180 tablet    Refill:  3   Midazolam (NAYZILAM) 5 MG/0.1ML SOLN    Sig: Place 5 mg into the nose as needed (for seizure clusters or seizure lasting more than 2 minutes).    Dispense:  2 each    Refill:  0    Return in about 1 year (around 09/28/2024).    Windell Norfolk, MD 09/29/2023, 2:03 PM  [facility] [address], [address] [place], [place] [address] [phone]

## 2023-09-30 LAB — LEVETIRACETAM LEVEL: Levetiracetam Lvl: 14.7 ug/mL (ref 10.0–40.0)

## 2023-11-25 DIAGNOSIS — G5601 Carpal tunnel syndrome, right upper limb: Secondary | ICD-10-CM | POA: Diagnosis not present

## 2023-12-13 DIAGNOSIS — M25561 Pain in right knee: Secondary | ICD-10-CM | POA: Diagnosis not present

## 2023-12-13 DIAGNOSIS — S93401A Sprain of unspecified ligament of right ankle, initial encounter: Secondary | ICD-10-CM | POA: Diagnosis not present

## 2023-12-13 DIAGNOSIS — S93402A Sprain of unspecified ligament of left ankle, initial encounter: Secondary | ICD-10-CM | POA: Diagnosis not present

## 2023-12-16 DIAGNOSIS — M25571 Pain in right ankle and joints of right foot: Secondary | ICD-10-CM | POA: Diagnosis not present

## 2023-12-16 DIAGNOSIS — M545 Low back pain, unspecified: Secondary | ICD-10-CM | POA: Diagnosis not present

## 2023-12-16 DIAGNOSIS — M25572 Pain in left ankle and joints of left foot: Secondary | ICD-10-CM | POA: Diagnosis not present

## 2023-12-16 DIAGNOSIS — M25531 Pain in right wrist: Secondary | ICD-10-CM | POA: Diagnosis not present

## 2023-12-23 ENCOUNTER — Encounter: Payer: Self-pay | Admitting: Internal Medicine

## 2023-12-24 DIAGNOSIS — M545 Low back pain, unspecified: Secondary | ICD-10-CM | POA: Diagnosis not present

## 2024-01-04 DIAGNOSIS — M545 Low back pain, unspecified: Secondary | ICD-10-CM | POA: Diagnosis not present

## 2024-01-05 ENCOUNTER — Ambulatory Visit: Payer: BC Managed Care – PPO | Admitting: Neurology

## 2024-01-06 DIAGNOSIS — R202 Paresthesia of skin: Secondary | ICD-10-CM | POA: Diagnosis not present

## 2024-01-14 DIAGNOSIS — M5416 Radiculopathy, lumbar region: Secondary | ICD-10-CM | POA: Diagnosis not present

## 2024-02-08 ENCOUNTER — Ambulatory Visit (INDEPENDENT_AMBULATORY_CARE_PROVIDER_SITE_OTHER): Payer: BC Managed Care – PPO | Admitting: Family

## 2024-02-08 ENCOUNTER — Telehealth: Payer: Self-pay

## 2024-02-08 ENCOUNTER — Encounter: Payer: Self-pay | Admitting: Family

## 2024-02-08 VITALS — BP 124/78 | HR 79 | Ht 61.0 in | Wt 140.4 lb

## 2024-02-08 DIAGNOSIS — I10 Essential (primary) hypertension: Secondary | ICD-10-CM | POA: Diagnosis not present

## 2024-02-08 DIAGNOSIS — E7849 Other hyperlipidemia: Secondary | ICD-10-CM | POA: Diagnosis not present

## 2024-02-08 DIAGNOSIS — E559 Vitamin D deficiency, unspecified: Secondary | ICD-10-CM | POA: Diagnosis not present

## 2024-02-08 DIAGNOSIS — G40319 Generalized idiopathic epilepsy and epileptic syndromes, intractable, without status epilepticus: Secondary | ICD-10-CM | POA: Diagnosis not present

## 2024-02-08 LAB — HEPATIC FUNCTION PANEL
ALT: 57 U/L — ABNORMAL HIGH (ref 0–35)
AST: 42 U/L — ABNORMAL HIGH (ref 0–37)
Albumin: 4.2 g/dL (ref 3.5–5.2)
Alkaline Phosphatase: 68 U/L (ref 39–117)
Bilirubin, Direct: 0.1 mg/dL (ref 0.0–0.3)
Total Bilirubin: 0.4 mg/dL (ref 0.2–1.2)
Total Protein: 6.9 g/dL (ref 6.0–8.3)

## 2024-02-08 LAB — BASIC METABOLIC PANEL
BUN: 20 mg/dL (ref 6–23)
CO2: 29 meq/L (ref 19–32)
Calcium: 9.4 mg/dL (ref 8.4–10.5)
Chloride: 107 meq/L (ref 96–112)
Creatinine, Ser: 0.66 mg/dL (ref 0.40–1.20)
GFR: 93.4 mL/min (ref >60.00)
Glucose, Bld: 75 mg/dL (ref 70–99)
Potassium: 5.5 meq/L — ABNORMAL HIGH (ref 3.5–5.1)
Sodium: 142 meq/L (ref 135–145)

## 2024-02-08 LAB — CBC WITH DIFFERENTIAL/PLATELET
Basophils Absolute: 0 10*3/uL (ref 0.0–0.1)
Basophils Relative: 0.2 % (ref 0.0–3.0)
Eosinophils Absolute: 0.2 10*3/uL (ref 0.0–0.7)
Eosinophils Relative: 3.6 % (ref 0.0–5.0)
HCT: 44.6 % (ref 36.0–46.0)
Hemoglobin: 15.1 g/dL — ABNORMAL HIGH (ref 12.0–15.0)
Lymphocytes Relative: 24.6 % (ref 12.0–46.0)
Lymphs Abs: 1.4 10*3/uL (ref 0.7–4.0)
MCHC: 33.8 g/dL (ref 30.0–36.0)
MCV: 97 fl (ref 78.0–100.0)
Monocytes Absolute: 0.6 10*3/uL (ref 0.1–1.0)
Monocytes Relative: 11.5 % (ref 3.0–12.0)
Neutro Abs: 3.3 10*3/uL (ref 1.4–7.7)
Neutrophils Relative %: 60.1 % (ref 43.0–77.0)
Platelets: 218 10*3/uL (ref 150.0–400.0)
RBC: 4.6 Mil/uL (ref 3.87–5.11)
RDW: 12.7 % (ref 11.5–15.5)
WBC: 5.5 10*3/uL (ref 4.0–10.5)

## 2024-02-08 LAB — VITAMIN D 25 HYDROXY (VIT D DEFICIENCY, FRACTURES): VITD: >120 ng/mL

## 2024-02-08 NOTE — Progress Notes (Signed)
 Acute Office Visit  Subjective:     Patient ID: Emily Graham, female    DOB: 1961/11/28, 63 y.o.   MRN: [id number]  Chief Complaint  Patient presents with  . Medical Clearance    Back Surgery - due for CPE    HPI Patient is in today for medical clearance for back surgery next month. She has a history of seizure disorder that has been stable on Keppra. Pain is constant in her lower back after a fall Dec 27th. Pain is 8/10, worse with sitting/standing for extended periods of time. She has an adjustable bed that also helps.   Has lost 25 lbs on the last 3 months on Phentermine. Tolerates well.   Review of Systems  Constitutional: Negative.   Respiratory: Negative.    Cardiovascular: Negative.   Gastrointestinal: Negative.   Musculoskeletal:  Positive for back pain.  Skin: Negative.   Neurological: Negative.   Psychiatric/Behavioral: Negative.    All other systems reviewed and are negative. Past Medical History:  Diagnosis Date  . ADD (attention deficit disorder) 07/19/2013  . Arthritis   . Cholecystitis, acute with cholelithiasis 07/19/2013  . Chronic left shoulder pain 07/19/2013  . Dyspnea   . GERD (gastroesophageal reflux disease) 07/19/2013  . Goiter   . HTN (hypertension)   . Hypoglycemia   . Hypoglycemia 07/19/2013  . Murmur   . Pre-diabetes   . Seizure disorder, primary generalized (HCC) 07/19/2013   May be related to hypoglycemia  . Seizures (HCC) last 01/08/18   2013-grand mal due to hypoglycemia  . Sleep apnea    "mild"-not using CPAP per pt    Social History   Socioeconomic History  . Marital status: Married    Spouse name: Not on file  . Number of children: 2  . Years of education: Not on file  . Highest education level: 12th grade  Occupational History    Employer: UNEMPLOYED  Tobacco Use  . Smoking status: Never  . Smokeless tobacco: Never  Vaping Use  . Vaping status: Never Used  Substance and Sexual Activity  . Alcohol use: Yes    Alcohol/week:  3.0 standard drinks of alcohol    Types: 1 Standard drinks or equivalent, 2 Glasses of wine per week    Comment: Occasional/social   . Drug use: No  . Sexual activity: Yes  Other Topics Concern  . Not on file  Social History Narrative   Right handed   Lives with husband   Caffeine - 1 cup daily   Social Drivers of Health   Financial Resource Strain: Low Risk  (02/05/2024)   Overall Financial Resource Strain (CARDIA)   . Difficulty of Paying Living Expenses: Not very hard  Food Insecurity: No Food Insecurity (02/05/2024)   Hunger Vital Sign   . Worried About Programme researcher, broadcasting/film/video in the Last Year: Never true   . Ran Out of Food in the Last Year: Never true  Transportation Needs: No Transportation Needs (02/05/2024)   PRAPARE - Transportation   . Lack of Transportation (Medical): No   . Lack of Transportation (Non-Medical): No  Physical Activity: Unknown (02/05/2024)   Exercise Vital Sign   . Days of Exercise per Week: 0 days   . Minutes of Exercise per Session: Not on file  Stress: No Stress Concern Present (02/05/2024)   [employer] of Occupational Health - Occupational Stress Questionnaire   . Feeling of Stress : Only a little  Social Connections: Socially Integrated (02/05/2024)   Social  Connection and Isolation Panel [NHANES]   . Frequency of Communication with Friends and Family: More than three times a week   . Frequency of Social Gatherings with Friends and Family: More than three times a week   . Attends Religious Services: More than 4 times per year   . Active Member of Clubs or Organizations: Yes   . Attends Banker Meetings: More than 4 times per year   . Marital Status: Married  Catering manager Violence: Not on file    Past Surgical History:  Procedure Laterality Date  . ABDOMINAL HYSTERECTOMY    . BREAST ENHANCEMENT SURGERY    . CHOLECYSTECTOMY N/A 07/20/2013   Procedure: LAPAROSCOPIC CHOLECYSTECTOMY WITH INTRAOPERATIVE CHOLANGIOGRAM;  Surgeon:  Kandis Cocking, MD;  Location: [facility];  Service: General;  Laterality: N/A;  . GASTRIC BYPASS    . GASTRIC BYPASS    . GASTRIC BYPASS  2000  . leg lift  2008   had aspiration in lungs, fluid was "drained" per pt/this was done at outpatient center/[facility]  . Stomach tuck    . TOTAL SHOULDER REPLACEMENT      Family History  Problem Relation Age of Onset  . Stroke Mother   . Skin cancer Mother   . Hypertension Mother   . Hyperlipidemia Mother   . Coronary artery disease Father        MI at age 87  . Heart disease Father   . Hypertension Father   . Diabetes Father   . Breast cancer Maternal Aunt   . Cancer Maternal Aunt   . Breast cancer Paternal Aunt   . Breast cancer Paternal Uncle   . Cancer Cousin   . Colon cancer Neg Hx     Allergies  Allergen Reactions  . Latex Rash    Current Outpatient Medications on File Prior to Visit  Medication Sig Dispense Refill  . albuterol (VENTOLIN HFA) 108 (90 Base) MCG/ACT inhaler Inhale 1-2 puffs into the lungs every 6 (six) hours as needed for wheezing or shortness of breath. 18 g 1  . aspirin 81 MG tablet Take 81 mg by mouth daily.    Marland Kitchen b complex vitamins tablet Take 1 tablet by mouth daily.    . Cyanocobalamin (B-12) 5000 MCG SUBL Place under the tongue.    . gabapentin (NEURONTIN) 600 MG tablet Take  1/2 to 1 tablet  3 x /day  as needed for Pain 270 tablet 1  . IRON, FERROUS SULFATE, PO Take 65 mg by mouth daily.    Marland Kitchen levETIRAcetam (KEPPRA) 500 MG tablet Take 1 tablet (500 mg total) by mouth 2 (two) times daily. 180 tablet 3  . loratadine (CLARITIN) 10 MG tablet Take 10 mg by mouth daily.    . Midazolam (NAYZILAM) 5 MG/0.1ML SOLN Place 5 mg into the nose as needed (for seizure clusters or seizure lasting more than 2 minutes). 2 each 0  . milk thistle 175 MG tablet Take 175 mg by mouth daily.    . Multiple Vitamin (MULTIVITAMIN) tablet Take 1 tablet by mouth daily.     . Multiple Vitamins-Minerals (HAIR SKIN AND NAILS FORMULA PO) Take 1  tablet by mouth daily.    . polycarbophil (FIBERCON) 625 MG tablet Take 625 mg by mouth daily.    . Probiotic CAPS Take 1 capsule by mouth daily.    . TURMERIC PO Take 1,500 mg by mouth 2 (two) times daily.    . Vitamin D, Ergocalciferol, (DRISDOL) 50000 units CAPS  capsule Take 1 capsule (50,000 Units total) by mouth daily. 90 capsule 1  . Arginine 500 MG CAPS Take 500 mg by mouth daily.    . Omega-3 Fatty Acids (FISH OIL PO) Take 1 capsule by mouth daily.     No current facility-administered medications on file prior to visit.    BP 124/78   Pulse 79   Ht 5\' 1"  (1.549 m)   Wt 140 lb 6.4 oz (63.7 kg)   SpO2 94%   BMI 26.53 kg/m chart      Objective:    BP 124/78   Pulse 79   Ht 5\' 1"  (1.549 m)   Wt 140 lb 6.4 oz (63.7 kg)   SpO2 94%   BMI 26.53 kg/m    Physical Exam Vitals and nursing note reviewed.  Constitutional:      Appearance: Normal appearance. She is normal weight.  Cardiovascular:     Rate and Rhythm: Normal rate and regular rhythm.  Pulmonary:     Effort: Pulmonary effort is normal.     Breath sounds: Normal breath sounds.  Abdominal:     General: Abdomen is flat. Bowel sounds are normal.     Palpations: Abdomen is soft.  Musculoskeletal:        General: No swelling or deformity.  Skin:    General: Skin is warm and dry.  Neurological:     General: No focal deficit present.     Mental Status: She is alert and oriented to person, place, and time. Mental status is at baseline.  Psychiatric:        Mood and Affect: Mood normal.        Behavior: Behavior normal.        Thought Content: Thought content normal.   Results for orders placed or performed in visit on 02/08/24  Basic Metabolic Panel (BMET)  Result Value Ref Range   Sodium 142 135 - 145 mEq/L   Potassium 5.5 (H) 3.5 - 5.1 mEq/L   Chloride 107 96 - 112 mEq/L   CO2 29 19 - 32 mEq/L   Glucose, Bld 75 70 - 99 mg/dL   BUN 20 6 - 23 mg/dL   Creatinine, Ser 1.61 0.40 - 1.20 mg/dL   GFR 09.60  >45.40 mL/min   Calcium 9.4 8.4 - 10.5 mg/dL  CBC w/Diff  Result Value Ref Range   WBC 5.5 4.0 - 10.5 K/uL   RBC 4.60 3.87 - 5.11 Mil/uL   Hemoglobin 15.1 (H) 12.0 - 15.0 g/dL   HCT 98.1 19.1 - 47.8 %   MCV 97.0 78.0 - 100.0 fl   MCHC 33.8 30.0 - 36.0 g/dL   RDW 29.5 62.1 - 30.8 %   Platelets 218.0 150.0 - 400.0 K/uL   Neutrophils Relative % 60.1 43.0 - 77.0 %   Lymphocytes Relative 24.6 12.0 - 46.0 %   Monocytes Relative 11.5 3.0 - 12.0 %   Eosinophils Relative 3.6 0.0 - 5.0 %   Basophils Relative 0.2 0.0 - 3.0 %   Neutro Abs 3.3 1.4 - 7.7 K/uL   Lymphs Abs 1.4 0.7 - 4.0 K/uL   Monocytes Absolute 0.6 0.1 - 1.0 K/uL   Eosinophils Absolute 0.2 0.0 - 0.7 K/uL   Basophils Absolute 0.0 0.0 - 0.1 K/uL  Hepatic function panel  Result Value Ref Range   Total Bilirubin 0.4 0.2 - 1.2 mg/dL   Bilirubin, Direct 0.1 0.0 - 0.3 mg/dL   Alkaline Phosphatase 68 39 - 117 U/L  AST 42 (H) 0 - 37 U/L   ALT 57 (H) 0 - 35 U/L   Total Protein 6.9 6.0 - 8.3 g/dL   Albumin 4.2 3.5 - 5.2 g/dL  Vitamin D (25 hydroxy)  Result Value Ref Range   VITD >120 ng/mL        Assessment & Plan:   Problem List Items Addressed This Visit     Vitamin D deficiency - Primary   Relevant Orders   Basic Metabolic Panel (BMET) (Completed)   CBC w/Diff (Completed)   Hepatic function panel (Completed)   Vitamin D (25 hydroxy) (Completed)   Seizure disorder, primary generalized (HCC)   Relevant Orders   EKG 12-Lead   Basic Metabolic Panel (BMET) (Completed)   CBC w/Diff (Completed)   Hepatic function panel (Completed)   Hyperlipidemia   Relevant Orders   EKG 12-Lead   Basic Metabolic Panel (BMET) (Completed)   CBC w/Diff (Completed)   Hepatic function panel (Completed)   HTN (hypertension)   Relevant Orders   EKG 12-Lead   Basic Metabolic Panel (BMET) (Completed)   CBC w/Diff (Completed)   Hepatic function panel (Completed)    Labs obtained. Will notify patient pending results. Follow-up with new  PCP.   No follow-ups on file.  Eulis Foster, FNP

## 2024-02-08 NOTE — Telephone Encounter (Signed)
 CRITICAL VALUE STICKER  CRITICAL VALUE: Vit. D >120  RECEIVER (on-site recipient of call):  DATE & TIME NOTIFIED: 2/25 1630  MESSENGER (representative from lab):  MD NOTIFIED: Jones/Padonda  TIME OF NOTIFICATION:  RESPONSE:

## 2024-02-10 DIAGNOSIS — M25531 Pain in right wrist: Secondary | ICD-10-CM | POA: Diagnosis not present

## 2024-02-15 ENCOUNTER — Other Ambulatory Visit: Payer: Self-pay | Admitting: Orthopedic Surgery

## 2024-02-23 ENCOUNTER — Other Ambulatory Visit: Payer: Self-pay

## 2024-03-03 ENCOUNTER — Other Ambulatory Visit: Payer: Self-pay

## 2024-03-03 NOTE — Progress Notes (Signed)
 Surgical Instructions   Your procedure is scheduled on March 17, 2024. Report to [facility] Main Entrance "A" at 6:30 A.M., then check in with the Admitting office. Any questions or running late day of surgery: call [phone]  Questions prior to your surgery date: call [phone], Monday-Friday, 8am-4pm. If you experience any cold or flu symptoms such as cough, fever, chills, shortness of breath, etc. between now and your scheduled surgery, please notify us at the above number.     Remember:  Do not eat after midnight the night before your surgery  You may drink clear liquids until 5:30 the morning of your surgery.   Clear liquids allowed are: Water, Non-Citrus Juices (without pulp), Carbonated Beverages, Clear Tea (no milk, honey, etc.), Black Coffee Only (NO MILK, CREAM OR POWDERED CREAMER of any kind), and Gatorade.    Take these medicines the morning of surgery with A SIP OF WATER  levETIRAcetam (KEPPRA)  loratadine (CLARITIN)  Midazolam (NAYZILAM)    May take these medicines IF NEEDED: albuterol (VENTOLIN HFA) inhaler   acetaminophen (TYLENOL)  Brimonidine Tartrate (LUMIFY)       One week prior to surgery, STOP taking any Aspirin (unless otherwise instructed by your surgeon) Aleve, Naproxen, Ibuprofen, Motrin, Advil, Goody's, BC's, all herbal medications, fish oil, and non-prescription vitamins.                     Do NOT Smoke (Tobacco/Vaping) for 24 hours prior to your procedure.  If you use a CPAP at night, you may bring your mask/headgear for your overnight stay.   You will be asked to remove any contacts, glasses, piercing's, hearing aid's, dentures/partials prior to surgery. Please bring cases for these items if needed.    Patients discharged the day of surgery will not be allowed to drive home, and someone needs to stay with them for 24 hours.  SURGICAL WAITING ROOM VISITATION Patients may have no more than 2 support people in the waiting area - these  visitors may rotate.   Pre-op nurse will coordinate an appropriate time for 1 ADULT support person, who may not rotate, to accompany patient in pre-op.  Children under the age of 70 must have an adult with them who is not the patient and must remain in the main waiting area with an adult.  If the patient needs to stay at the hospital during part of their recovery, the visitor guidelines for inpatient rooms apply.  Please refer to the [facility] website for the visitor guidelines for any additional information.   If you received a COVID test during your pre-op visit  it is requested that you wear a mask when out in public, stay away from anyone that may not be feeling well and notify your surgeon if you develop symptoms. If you have been in contact with anyone that has tested positive in the last 10 days please notify you surgeon.      Pre-operative 5 CHG Bathing Instructions   You can play a key role in reducing the risk of infection after surgery. Your skin needs to be as free of germs as possible. You can reduce the number of germs on your skin by washing with CHG (chlorhexidine gluconate) soap before surgery. CHG is an antiseptic soap that kills germs and continues to kill germs even after washing.   DO NOT use if you have an allergy to chlorhexidine/CHG or antibacterial soaps. If your skin becomes reddened or irritated, stop using the CHG and  notify one of our RNs at [phone].   Please shower with the CHG soap starting 4 days before surgery using the following schedule:     Please keep in mind the following:  DO NOT shave, including legs and underarms, starting the day of your first shower.   You may shave your face at any point before/day of surgery.  Place clean sheets on your bed the day you start using CHG soap. Use a clean washcloth (not used since being washed) for each shower. DO NOT sleep with pets once you start using the CHG.   CHG Shower Instructions:  Wash your  face and private area with normal soap. If you choose to wash your hair, wash first with your normal shampoo.  After you use shampoo/soap, rinse your hair and body thoroughly to remove shampoo/soap residue.  Turn the water OFF and apply about 3 tablespoons (45 ml) of CHG soap to a CLEAN washcloth.  Apply CHG soap ONLY FROM YOUR NECK DOWN TO YOUR TOES (washing for 3-5 minutes)  DO NOT use CHG soap on face, private areas, open wounds, or sores.  Pay special attention to the area where your surgery is being performed.  If you are having back surgery, having someone wash your back for you may be helpful. Wait 2 minutes after CHG soap is applied, then you may rinse off the CHG soap.  Pat dry with a clean towel  Put on clean clothes/pajamas   If you choose to wear lotion, please use ONLY the CHG-compatible lotions that are listed below.  Additional instructions for the day of surgery: DO NOT APPLY any lotions, deodorants, cologne, or perfumes.   Do not bring valuables to the hospital. [facility] is not responsible for any belongings/valuables. Do not wear nail polish, gel polish, artificial nails, or any other type of covering on natural nails (fingers and toes) Do not wear jewelry or makeup Put on clean/comfortable clothes.  Please brush your teeth.  Ask your nurse before applying any prescription medications to the skin.     CHG Compatible Lotions   Aveeno Moisturizing lotion  Cetaphil Moisturizing Cream  Cetaphil Moisturizing Lotion  Clairol Herbal Essence Moisturizing Lotion, Dry Skin  Clairol Herbal Essence Moisturizing Lotion, Extra Dry Skin  Clairol Herbal Essence Moisturizing Lotion, Normal Skin  Curel Age Defying Therapeutic Moisturizing Lotion with Alpha Hydroxy  Curel Extreme Care Body Lotion  Curel Soothing Hands Moisturizing Hand Lotion  Curel Therapeutic Moisturizing Cream, Fragrance-Free  Curel Therapeutic Moisturizing Lotion, Fragrance-Free  Curel Therapeutic  Moisturizing Lotion, Original Formula  Eucerin Daily Replenishing Lotion  Eucerin Dry Skin Therapy Plus Alpha Hydroxy Crme  Eucerin Dry Skin Therapy Plus Alpha Hydroxy Lotion  Eucerin Original Crme  Eucerin Original Lotion  Eucerin Plus Crme Eucerin Plus Lotion  Eucerin TriLipid Replenishing Lotion  Keri Anti-Bacterial Hand Lotion  Keri Deep Conditioning Original Lotion Dry Skin Formula Softly Scented  Keri Deep Conditioning Original Lotion, Fragrance Free Sensitive Skin Formula  Keri Lotion Fast Absorbing Fragrance Free Sensitive Skin Formula  Keri Lotion Fast Absorbing Softly Scented Dry Skin Formula  Keri Original Lotion  Keri Skin Renewal Lotion Keri Silky Smooth Lotion  Keri Silky Smooth Sensitive Skin Lotion  Nivea Body Creamy Conditioning Oil  Nivea Body Extra Enriched Teacher, adult education Moisturizing Lotion Nivea Crme  Nivea Skin Firming Lotion  NutraDerm 30 Skin Lotion  NutraDerm Skin Lotion  NutraDerm Therapeutic Skin Cream  NutraDerm Therapeutic Skin Lotion  ProShield Protective  Hand Cream  Provon moisturizing lotion  Please read over the following fact sheets that you were given.

## 2024-03-06 ENCOUNTER — Encounter (HOSPITAL_COMMUNITY): Payer: Self-pay

## 2024-03-06 ENCOUNTER — Other Ambulatory Visit (HOSPITAL_COMMUNITY)

## 2024-03-06 ENCOUNTER — Encounter (HOSPITAL_COMMUNITY)
Admission: RE | Admit: 2024-03-06 | Discharge: 2024-03-06 | Disposition: A | Discharge status: Home / Self Care | Source: Ambulatory Visit | Attending: Orthopedic Surgery | Admitting: Orthopedic Surgery

## 2024-03-06 ENCOUNTER — Other Ambulatory Visit: Payer: Self-pay

## 2024-03-06 VITALS — BP 116/74 | HR 78 | Temp 98.1°F | Resp 18 | Ht 61.0 in | Wt 139.6 lb

## 2024-03-06 DIAGNOSIS — Z01812 Encounter for preprocedural laboratory examination: Secondary | ICD-10-CM | POA: Insufficient documentation

## 2024-03-06 DIAGNOSIS — M4726 Other spondylosis with radiculopathy, lumbar region: Secondary | ICD-10-CM | POA: Diagnosis not present

## 2024-03-06 DIAGNOSIS — Z9884 Bariatric surgery status: Secondary | ICD-10-CM | POA: Diagnosis not present

## 2024-03-06 DIAGNOSIS — Z01818 Encounter for other preprocedural examination: Secondary | ICD-10-CM

## 2024-03-06 DIAGNOSIS — G40409 Other generalized epilepsy and epileptic syndromes, not intractable, without status epilepticus: Secondary | ICD-10-CM | POA: Insufficient documentation

## 2024-03-06 DIAGNOSIS — R7303 Prediabetes: Secondary | ICD-10-CM | POA: Insufficient documentation

## 2024-03-06 DIAGNOSIS — I10 Essential (primary) hypertension: Secondary | ICD-10-CM | POA: Insufficient documentation

## 2024-03-06 DIAGNOSIS — G4733 Obstructive sleep apnea (adult) (pediatric): Secondary | ICD-10-CM | POA: Insufficient documentation

## 2024-03-06 HISTORY — DX: Other complications of anesthesia, initial encounter: T88.59XA

## 2024-03-06 HISTORY — DX: Myoneural disorder, unspecified: G70.9

## 2024-03-06 LAB — TYPE AND SCREEN
ABO/RH(D): A POS
Antibody Screen: NEGATIVE

## 2024-03-06 LAB — BASIC METABOLIC PANEL
Anion gap: 7 (ref 5–15)
BUN: 23 mg/dL (ref 8–23)
CO2: 25 mmol/L (ref 22–32)
Calcium: 9.2 mg/dL (ref 8.9–10.3)
Chloride: 108 mmol/L (ref 98–111)
Creatinine, Ser: 0.53 mg/dL (ref 0.44–1.00)
GFR, Estimated: >60 mL/min (ref >60)
Glucose, Bld: 95 mg/dL (ref 70–99)
Potassium: 4.9 mmol/L (ref 3.5–5.1)
Sodium: 140 mmol/L (ref 135–145)

## 2024-03-06 LAB — CBC
HCT: 46.6 % — ABNORMAL HIGH (ref 36.0–46.0)
Hemoglobin: 15.3 g/dL — ABNORMAL HIGH (ref 12.0–15.0)
MCH: 31.8 pg (ref 26.0–34.0)
MCHC: 32.8 g/dL (ref 30.0–36.0)
MCV: 96.9 fL (ref 80.0–100.0)
Platelets: 233 10*3/uL (ref 150–400)
RBC: 4.81 MIL/uL (ref 3.87–5.11)
RDW: 12.2 % (ref 11.5–15.5)
WBC: 7.1 10*3/uL (ref 4.0–10.5)
nRBC: 0 % (ref 0.0–0.2)

## 2024-03-06 LAB — SURGICAL PCR SCREEN
MRSA, PCR: NEGATIVE
Staphylococcus aureus: NEGATIVE

## 2024-03-06 NOTE — Progress Notes (Signed)
 Surgical Instructions   Your procedure is scheduled on March 15, 2024. Report to [facility] Main Entrance "A" at 6:30 A.M., then check in with the Admitting office. Any questions or running late day of surgery: call [phone]  Questions prior to your surgery date: call [phone], Monday-Friday, 8am-4pm. If you experience any cold or flu symptoms such as cough, fever, chills, shortness of breath, etc. between now and your scheduled surgery, please notify us at the above number.     Remember:  Do not eat after midnight the night before your surgery  You may drink clear liquids until 5:30 the morning of your surgery.   Clear liquids allowed are: Water, Non-Citrus Juices (without pulp), Carbonated Beverages, Clear Tea (no milk, honey, etc.), Black Coffee Only (NO MILK, CREAM OR POWDERED CREAMER of any kind), and Gatorade. Patient Instructions  The night before surgery:  No food after midnight. ONLY clear liquids after midnight  The day of surgery (if you do NOT have diabetes):  Drink ONE (1) Pre-Surgery Clear Ensure by 5:30am the morning of surgery. Drink in one sitting. Do not sip.  This drink was given to you during your hospital  pre-op appointment visit.  Nothing else to drink after completing the  Pre-Surgery Clear Ensure.         If you have questions, please contact your surgeon's office.    Take these medicines the morning of surgery with A SIP OF WATER  levETIRAcetam (KEPPRA)  loratadine (CLARITIN)     May take these medicines IF NEEDED: albuterol (VENTOLIN HFA) inhaler   acetaminophen (TYLENOL)  Brimonidine Tartrate (LUMIFY)  Midazolam (NAYZILAM)      One week prior to surgery, STOP taking any Aspirin (unless otherwise instructed by your surgeon) Aleve, Naproxen, Ibuprofen, Motrin, Advil, Goody's, BC's, all herbal medications, fish oil, and non-prescription vitamins.                     Do NOT Smoke (Tobacco/Vaping) for 24 hours prior to your  procedure.  If you use a CPAP at night, you may bring your mask/headgear for your overnight stay.   You will be asked to remove any contacts, glasses, piercing's, hearing aid's, dentures/partials prior to surgery. Please bring cases for these items if needed.    Patients discharged the day of surgery will not be allowed to drive home, and someone needs to stay with them for 24 hours.  SURGICAL WAITING ROOM VISITATION Patients may have no more than 2 support people in the waiting area - these visitors may rotate.   Pre-op nurse will coordinate an appropriate time for 1 ADULT support person, who may not rotate, to accompany patient in pre-op.  Children under the age of 34 must have an adult with them who is not the patient and must remain in the main waiting area with an adult.  If the patient needs to stay at the hospital during part of their recovery, the visitor guidelines for inpatient rooms apply.  Please refer to the [facility] website for the visitor guidelines for any additional information.   If you received a COVID test during your pre-op visit  it is requested that you wear a mask when out in public, stay away from anyone that may not be feeling well and notify your surgeon if you develop symptoms. If you have been in contact with anyone that has tested positive in the last 10 days please notify you surgeon.      Pre-operative 5 CHG  Bathing Instructions   You can play a key role in reducing the risk of infection after surgery. Your skin needs to be as free of germs as possible. You can reduce the number of germs on your skin by washing with CHG (chlorhexidine gluconate) soap before surgery. CHG is an antiseptic soap that kills germs and continues to kill germs even after washing.   DO NOT use if you have an allergy to chlorhexidine/CHG or antibacterial soaps. If your skin becomes reddened or irritated, stop using the CHG and notify one of our RNs at [phone].   Please  shower with the CHG soap starting 4 days before surgery using the following schedule:     Please keep in mind the following:  DO NOT shave, including legs and underarms, starting the day of your first shower.   You may shave your face at any point before/day of surgery.  Place clean sheets on your bed the day you start using CHG soap. Use a clean washcloth (not used since being washed) for each shower. DO NOT sleep with pets once you start using the CHG.   CHG Shower Instructions:  Wash your face and private area with normal soap. If you choose to wash your hair, wash first with your normal shampoo.  After you use shampoo/soap, rinse your hair and body thoroughly to remove shampoo/soap residue.  Turn the water OFF and apply about 3 tablespoons (45 ml) of CHG soap to a CLEAN washcloth.  Apply CHG soap ONLY FROM YOUR NECK DOWN TO YOUR TOES (washing for 3-5 minutes)  DO NOT use CHG soap on face, private areas, open wounds, or sores.  Pay special attention to the area where your surgery is being performed.  If you are having back surgery, having someone wash your back for you may be helpful. Wait 2 minutes after CHG soap is applied, then you may rinse off the CHG soap.  Pat dry with a clean towel  Put on clean clothes/pajamas   If you choose to wear lotion, please use ONLY the CHG-compatible lotions that are listed below.  Additional instructions for the day of surgery: DO NOT APPLY any lotions, deodorants, cologne, or perfumes.   Do not bring valuables to the hospital. [facility] is not responsible for any belongings/valuables. Do not wear nail polish, gel polish, artificial nails, or any other type of covering on natural nails (fingers and toes) Do not wear jewelry or makeup Put on clean/comfortable clothes.  Please brush your teeth.  Ask your nurse before applying any prescription medications to the skin.     CHG Compatible Lotions   Aveeno Moisturizing lotion  Cetaphil  Moisturizing Cream  Cetaphil Moisturizing Lotion  Clairol Herbal Essence Moisturizing Lotion, Dry Skin  Clairol Herbal Essence Moisturizing Lotion, Extra Dry Skin  Clairol Herbal Essence Moisturizing Lotion, Normal Skin  Curel Age Defying Therapeutic Moisturizing Lotion with Alpha Hydroxy  Curel Extreme Care Body Lotion  Curel Soothing Hands Moisturizing Hand Lotion  Curel Therapeutic Moisturizing Cream, Fragrance-Free  Curel Therapeutic Moisturizing Lotion, Fragrance-Free  Curel Therapeutic Moisturizing Lotion, Original Formula  Eucerin Daily Replenishing Lotion  Eucerin Dry Skin Therapy Plus Alpha Hydroxy Crme  Eucerin Dry Skin Therapy Plus Alpha Hydroxy Lotion  Eucerin Original Crme  Eucerin Original Lotion  Eucerin Plus Crme Eucerin Plus Lotion  Eucerin TriLipid Replenishing Lotion  Keri Anti-Bacterial Hand Lotion  Keri Deep Conditioning Original Lotion Dry Skin Formula Softly Scented  Keri Deep Conditioning Original Lotion, Fragrance Free Sensitive Skin Formula  Keri Lotion Fast [employer] Fragrance Free Sensitive Skin Formula  Keri Lotion Fast Absorbing Softly Scented Dry Skin Formula  Keri Original Lotion  Keri Skin Renewal Lotion Keri Silky Smooth Lotion  Keri Silky Smooth Sensitive Skin Lotion  Nivea Body Creamy Conditioning Oil  Nivea Body Extra Enriched Lotion  Nivea Body Original Lotion  Nivea Body Sheer Moisturizing Lotion Nivea Crme  Nivea Skin Firming Lotion  NutraDerm 30 Skin Lotion  NutraDerm Skin Lotion  NutraDerm Therapeutic Skin Cream  NutraDerm Therapeutic Skin Lotion  ProShield Protective Hand Cream  Provon moisturizing lotion  Please read over the following fact sheets that you were given.

## 2024-03-06 NOTE — Progress Notes (Unsigned)
 Patient name: Emily Graham MRN: [id number] DOB: Aug 23, 1961 Sex: female  REASON FOR CONSULT: Abdominal exposure for L4-L5 ALIF  HPI: Emily Graham is a 63 y.o. female, with history of hypertension, prediabetes, and chronic back pain that presents for evaluation of abdominal exposure for L4-L5 ALIF.  Patient is under the care of Dr. Yevette Edwards.  She describes chronic lower back pain since age 58.  This got worse on December 10, 2023 when she had a mechanical fall at home.  She has failed conservative management.  Dr. Yevette Edwards has recommended an anterior lumbar interbody fusion at L4-L5 and asked vascular surgery to assist.  Previous abdominal surgery includes hysterectomy, lap gastric bypass, lap cholecystectomy, abdominoplasty.  Past Medical History:  Diagnosis Date   ADD (attention deficit disorder) 07/19/2013   Arthritis    Cholecystitis, acute with cholelithiasis 07/19/2013   Chronic left shoulder pain 07/19/2013   Dyspnea    GERD (gastroesophageal reflux disease) 07/19/2013   Goiter    HTN (hypertension)    Hypoglycemia    Hypoglycemia 07/19/2013   Murmur    Pre-diabetes    Seizure disorder, primary generalized (HCC) 07/19/2013   May be related to hypoglycemia   Seizures (HCC) last 01/08/18   2013-grand mal due to hypoglycemia   Sleep apnea    "mild"-not using CPAP per pt    Past Surgical History:  Procedure Laterality Date   ABDOMINAL HYSTERECTOMY     BREAST ENHANCEMENT SURGERY     CHOLECYSTECTOMY N/A 07/20/2013   Procedure: LAPAROSCOPIC CHOLECYSTECTOMY WITH INTRAOPERATIVE CHOLANGIOGRAM;  Surgeon: Kandis Cocking, MD;  Location: [facility];  Service: General;  Laterality: N/A;   GASTRIC BYPASS     GASTRIC BYPASS     GASTRIC BYPASS  2000   leg lift  2008   had aspiration in lungs, fluid was "drained" per pt/this was done at outpatient center/[facility]   Stomach tuck     TOTAL SHOULDER REPLACEMENT      Family History  Problem Relation Age of Onset   Stroke Mother    Skin cancer Mother     Hypertension Mother    Hyperlipidemia Mother    Coronary artery disease Father        MI at age 54   Heart disease Father    Hypertension Father    Diabetes Father    Breast cancer Maternal Aunt    Cancer Maternal Aunt    Breast cancer Paternal Aunt    Breast cancer Paternal Uncle    Cancer Cousin    Colon cancer Neg Hx     SOCIAL HISTORY: Social History   Socioeconomic History   Marital status: Married    Spouse name: Not on file   Number of children: 2   Years of education: Not on file   Highest education level: 12th grade  Occupational History    Employer: UNEMPLOYED  Tobacco Use   Smoking status: Never   Smokeless tobacco: Never  Vaping Use   Vaping status: Never Used  Substance and Sexual Activity   Alcohol use: Yes    Alcohol/week: 3.0 standard drinks of alcohol    Types: 1 Standard drinks or equivalent, 2 Glasses of wine per week    Comment: Occasional/social    Drug use: No   Sexual activity: Yes  Other Topics Concern   Not on file  Social History Narrative   Right handed   Lives with husband   Caffeine - 1 cup daily   Social Drivers of Health  Financial Resource Strain: Low Risk  (02/05/2024)   Overall Financial Resource Strain (CARDIA)    Difficulty of Paying Living Expenses: Not very hard  Food Insecurity: No Food Insecurity (02/05/2024)   Hunger Vital Sign    Worried About Running Out of Food in the Last Year: Never true    Ran Out of Food in the Last Year: Never true  Transportation Needs: No Transportation Needs (02/05/2024)   PRAPARE - Administrator, Civil Service (Medical): No    Lack of Transportation (Non-Medical): No  Physical Activity: Unknown (02/05/2024)   Exercise Vital Sign    Days of Exercise per Week: 0 days    Minutes of Exercise per Session: Not on file  Stress: No Stress Concern Present (02/05/2024)   [employer] of Occupational Health - Occupational Stress Questionnaire    Feeling of Stress : Only a little   Social Connections: Socially Integrated (02/05/2024)   Social Connection and Isolation Panel [NHANES]    Frequency of Communication with Friends and Family: More than three times a week    Frequency of Social Gatherings with Friends and Family: More than three times a week    Attends Religious Services: More than 4 times per year    Active Member of [employer] or Organizations: Yes    Attends Engineer, structural: More than 4 times per year    Marital Status: Married  Catering manager Violence: Not on file    Allergies  Allergen Reactions   Latex Rash    Current Outpatient Medications  Medication Sig Dispense Refill   acetaminophen (TYLENOL) 650 MG CR tablet Take 1,300 mg by mouth every 8 (eight) hours as needed for pain.     albuterol (VENTOLIN HFA) 108 (90 Base) MCG/ACT inhaler Inhale 1-2 puffs into the lungs every 6 (six) hours as needed for wheezing or shortness of breath. (Patient not taking: Reported on 03/02/2024) 18 g 1   b complex vitamins tablet Take 1 tablet by mouth daily.     Brimonidine Tartrate (LUMIFY) 0.025 % SOLN Place 1 drop into both eyes daily as needed (redness/irritation).     Cyanocobalamin (B-12) 5000 MCG SUBL Place 5,000 mcg under the tongue once a week.     gabapentin (NEURONTIN) 600 MG tablet Take  1/2 to 1 tablet  3 x /day  as needed for Pain (Patient taking differently: Take 600 mg by mouth at bedtime.) 270 tablet 1   IRON, FERROUS SULFATE, PO Take 27 mg by mouth daily.     levETIRAcetam (KEPPRA) 500 MG tablet Take 1 tablet (500 mg total) by mouth 2 (two) times daily. 180 tablet 3   loratadine (CLARITIN) 10 MG tablet Take 10 mg by mouth daily.     Midazolam (NAYZILAM) 5 MG/0.1ML SOLN Place 5 mg into the nose as needed (for seizure clusters or seizure lasting more than 2 minutes). 2 each 0   Milk Thistle 1000 MG CAPS Take 1,000 mg by mouth daily.     Multiple Vitamin (MULTIVITAMIN) tablet Take 1 tablet by mouth daily.      Multiple Vitamins-Minerals  (HAIR SKIN AND NAILS FORMULA PO) Take 1 tablet by mouth daily.     psyllium (REGULOID) 0.52 g capsule Take 0.52 g by mouth daily.     TURMERIC PO Take 1,500 mg by mouth 2 (two) times daily.     Vitamin D, Ergocalciferol, (DRISDOL) 50000 units CAPS capsule Take 1 capsule (50,000 Units total) by mouth daily. 90 capsule 1  No current facility-administered medications for this visit.    REVIEW OF SYSTEMS:  [X]  denotes positive finding, [ ]  denotes negative finding Cardiac  Comments:  Chest pain or chest pressure:    Shortness of breath upon exertion:    Short of breath when lying flat:    Irregular heart rhythm:        Vascular    Pain in calf, thigh, or hip brought on by ambulation:    Pain in feet at night that wakes you up from your sleep:     Blood clot in your veins:    Leg swelling:         Pulmonary    Oxygen at home:    Productive cough:     Wheezing:         Neurologic    Sudden weakness in arms or legs:     Sudden numbness in arms or legs:     Sudden onset of difficulty speaking or slurred speech:    Temporary loss of vision in one eye:     Problems with dizziness:         Gastrointestinal    Blood in stool:     Vomited blood:         Genitourinary    Burning when urinating:     Blood in urine:        Psychiatric    Major depression:         Hematologic    Bleeding problems:    Problems with blood clotting too easily:        Skin    Rashes or ulcers:        Constitutional    Fever or chills:      PHYSICAL EXAM: There were no vitals filed for this visit.  GENERAL: The patient is a well-nourished female, in no acute distress. The vital signs are documented above. CARDIAC: There is a regular rate and rhythm.  VASCULAR:  Bilateral femoral pulses palpable Bilateral AT pulses palpable PULMONARY: No respiratory distress. ABDOMEN: Soft and non-tender.  Laparoscopic incisions well-healed.  Abdominoplasty incision well-healed. MUSCULOSKELETAL: There are  no major deformities or cyanosis. NEUROLOGIC: No focal weakness or paresthesias are detected. SKIN: There are no ulcers or rashes noted. PSYCHIATRIC: The patient has a normal affect.  DATA:   MRI Lumbar Reviewed 12/24/23    Assessment/Plan:   63 y.o. female, with history of hypertension, prediabetes, and chronic back pain that presents for evaluation of abdominal exposure for L4-L5 ALIF.  I reviewed her MRI and discussed she would be a good candidate for anterior approach.  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  Discussed paramedian incision over the left rectus muscle and then entering into the retroperitoneum to mobilize peritoneum and left ureter across midline.  Discussed mobilizing iliac artery and vein.  Discussed risk of injury to the above structures including risk of vessel injury.  All questions answered.  She is scheduled for next week and look forward to assisting Dr. Yevette Edwards.   Cephus Shelling, MD [facility] Office: [phone]

## 2024-03-06 NOTE — Progress Notes (Signed)
 Surgical Instructions   Your procedure is scheduled on March 17, 2024. Report to [facility] Main Entrance "A" at 6:30 A.M., then check in with the Admitting office. Any questions or running late day of surgery: call [phone]  Questions prior to your surgery date: call [phone], Monday-Friday, 8am-4pm. If you experience any cold or flu symptoms such as cough, fever, chills, shortness of breath, etc. between now and your scheduled surgery, please notify us at the above number.     Remember:  Do not eat after midnight the night before your surgery  You may drink clear liquids until 5:30 the morning of your surgery.   Clear liquids allowed are: Water, Non-Citrus Juices (without pulp), Carbonated Beverages, Clear Tea (no milk, honey, etc.), Black Coffee Only (NO MILK, CREAM OR POWDERED CREAMER of any kind), and Gatorade. Patient Instructions  The night before surgery:  No food after midnight. ONLY clear liquids after midnight  The day of surgery (if you do NOT have diabetes):  Drink ONE (1) Pre-Surgery Clear Ensure by 5:30am the morning of surgery. Drink in one sitting. Do not sip.  This drink was given to you during your hospital  pre-op appointment visit.  Nothing else to drink after completing the  Pre-Surgery Clear Ensure.         If you have questions, please contact your surgeon's office.    Take these medicines the morning of surgery with A SIP OF WATER  levETIRAcetam (KEPPRA)  loratadine (CLARITIN)     May take these medicines IF NEEDED: albuterol (VENTOLIN HFA) inhaler   acetaminophen (TYLENOL)  Brimonidine Tartrate (LUMIFY)  Midazolam (NAYZILAM)      One week prior to surgery, STOP taking any Aspirin (unless otherwise instructed by your surgeon) Aleve, Naproxen, Ibuprofen, Motrin, Advil, Goody's, BC's, all herbal medications, fish oil, and non-prescription vitamins.                     Do NOT Smoke (Tobacco/Vaping) for 24 hours prior to your  procedure.  If you use a CPAP at night, you may bring your mask/headgear for your overnight stay.   You will be asked to remove any contacts, glasses, piercing's, hearing aid's, dentures/partials prior to surgery. Please bring cases for these items if needed.    Patients discharged the day of surgery will not be allowed to drive home, and someone needs to stay with them for 24 hours.  SURGICAL WAITING ROOM VISITATION Patients may have no more than 2 support people in the waiting area - these visitors may rotate.   Pre-op nurse will coordinate an appropriate time for 1 ADULT support person, who may not rotate, to accompany patient in pre-op.  Children under the age of 61 must have an adult with them who is not the patient and must remain in the main waiting area with an adult.  If the patient needs to stay at the hospital during part of their recovery, the visitor guidelines for inpatient rooms apply.  Please refer to the [facility] website for the visitor guidelines for any additional information.   If you received a COVID test during your pre-op visit  it is requested that you wear a mask when out in public, stay away from anyone that may not be feeling well and notify your surgeon if you develop symptoms. If you have been in contact with anyone that has tested positive in the last 10 days please notify you surgeon.      Pre-operative 5 CHG  Bathing Instructions   You can play a key role in reducing the risk of infection after surgery. Your skin needs to be as free of germs as possible. You can reduce the number of germs on your skin by washing with CHG (chlorhexidine gluconate) soap before surgery. CHG is an antiseptic soap that kills germs and continues to kill germs even after washing.   DO NOT use if you have an allergy to chlorhexidine/CHG or antibacterial soaps. If your skin becomes reddened or irritated, stop using the CHG and notify one of our RNs at [phone].   Please  shower with the CHG soap starting 4 days before surgery using the following schedule:     Please keep in mind the following:  DO NOT shave, including legs and underarms, starting the day of your first shower.   You may shave your face at any point before/day of surgery.  Place clean sheets on your bed the day you start using CHG soap. Use a clean washcloth (not used since being washed) for each shower. DO NOT sleep with pets once you start using the CHG.   CHG Shower Instructions:  Wash your face and private area with normal soap. If you choose to wash your hair, wash first with your normal shampoo.  After you use shampoo/soap, rinse your hair and body thoroughly to remove shampoo/soap residue.  Turn the water OFF and apply about 3 tablespoons (45 ml) of CHG soap to a CLEAN washcloth.  Apply CHG soap ONLY FROM YOUR NECK DOWN TO YOUR TOES (washing for 3-5 minutes)  DO NOT use CHG soap on face, private areas, open wounds, or sores.  Pay special attention to the area where your surgery is being performed.  If you are having back surgery, having someone wash your back for you may be helpful. Wait 2 minutes after CHG soap is applied, then you may rinse off the CHG soap.  Pat dry with a clean towel  Put on clean clothes/pajamas   If you choose to wear lotion, please use ONLY the CHG-compatible lotions that are listed below.  Additional instructions for the day of surgery: DO NOT APPLY any lotions, deodorants, cologne, or perfumes.   Do not bring valuables to the hospital. [facility] is not responsible for any belongings/valuables. Do not wear nail polish, gel polish, artificial nails, or any other type of covering on natural nails (fingers and toes) Do not wear jewelry or makeup Put on clean/comfortable clothes.  Please brush your teeth.  Ask your nurse before applying any prescription medications to the skin.     CHG Compatible Lotions   Aveeno Moisturizing lotion  Cetaphil  Moisturizing Cream  Cetaphil Moisturizing Lotion  Clairol Herbal Essence Moisturizing Lotion, Dry Skin  Clairol Herbal Essence Moisturizing Lotion, Extra Dry Skin  Clairol Herbal Essence Moisturizing Lotion, Normal Skin  Curel Age Defying Therapeutic Moisturizing Lotion with Alpha Hydroxy  Curel Extreme Care Body Lotion  Curel Soothing Hands Moisturizing Hand Lotion  Curel Therapeutic Moisturizing Cream, Fragrance-Free  Curel Therapeutic Moisturizing Lotion, Fragrance-Free  Curel Therapeutic Moisturizing Lotion, Original Formula  Eucerin Daily Replenishing Lotion  Eucerin Dry Skin Therapy Plus Alpha Hydroxy Crme  Eucerin Dry Skin Therapy Plus Alpha Hydroxy Lotion  Eucerin Original Crme  Eucerin Original Lotion  Eucerin Plus Crme Eucerin Plus Lotion  Eucerin TriLipid Replenishing Lotion  Keri Anti-Bacterial Hand Lotion  Keri Deep Conditioning Original Lotion Dry Skin Formula Softly Scented  Keri Deep Conditioning Original Lotion, Fragrance Free Sensitive Skin Formula  Keri Lotion Fast [employer] Fragrance Free Sensitive Skin Formula  Keri Lotion Fast Absorbing Softly Scented Dry Skin Formula  Keri Original Lotion  Keri Skin Renewal Lotion Keri Silky Smooth Lotion  Keri Silky Smooth Sensitive Skin Lotion  Nivea Body Creamy Conditioning Oil  Nivea Body Extra Enriched Lotion  Nivea Body Original Lotion  Nivea Body Sheer Moisturizing Lotion Nivea Crme  Nivea Skin Firming Lotion  NutraDerm 30 Skin Lotion  NutraDerm Skin Lotion  NutraDerm Therapeutic Skin Cream  NutraDerm Therapeutic Skin Lotion  ProShield Protective Hand Cream  Provon moisturizing lotion  Please read over the following fact sheets that you were given.

## 2024-03-06 NOTE — Progress Notes (Signed)
 PCP - Barnie Del Cardiologist - Denies Neurologist - Amadou Camara,MD  PPM/ICD - denies Device Orders -  Rep Notified -   Chest x-ray - na EKG - 02/10/24 Stress Test - 2013 ECHO - 09/22/12 Cardiac Cath - denies  Sleep Study - pt states she was diagnosed with sleep apnea and used a CPAP prior to 1990 but lost weight after gastric bypass and does not use CPAP now.  CPAP -   Fasting Blood Sugar - na Checks Blood Sugar _____ times a day  Last dose of GLP1 agonist-  na GLP1 instructions:   Blood Thinner Instructions:na Aspirin Instructions:na  ERAS Protcol -clear liquids until 0530 PRE-SURGERY Ensure or G2- Ensure  COVID TEST- na    Anesthesia review: yes - seizure disorder; pt states she aspirated during surgery; her daughter has been diagnosed with Ehler's Danlos and she has been told she has characteristics of the disorder but never diagnosed.   Patient denies shortness of breath, fever, cough and chest pain at PAT appointment   All instructions explained to the patient, with a verbal understanding of the material. Patient agrees to go over the instructions while at home for a better understanding. Patient also instructed to wear a mask when out in public prior to surgery .  The opportunity to ask questions was provided.

## 2024-03-07 ENCOUNTER — Ambulatory Visit (INDEPENDENT_AMBULATORY_CARE_PROVIDER_SITE_OTHER): Admitting: Vascular Surgery

## 2024-03-07 ENCOUNTER — Encounter: Payer: Self-pay | Admitting: Vascular Surgery

## 2024-03-07 ENCOUNTER — Encounter: Payer: BC Managed Care – PPO | Admitting: Internal Medicine

## 2024-03-07 VITALS — BP 131/86 | HR 83 | Temp 98.7°F | Resp 18 | Ht 61.0 in | Wt 142.6 lb

## 2024-03-07 DIAGNOSIS — G8929 Other chronic pain: Secondary | ICD-10-CM | POA: Insufficient documentation

## 2024-03-07 DIAGNOSIS — M545 Low back pain, unspecified: Secondary | ICD-10-CM

## 2024-03-07 NOTE — Anesthesia Preprocedure Evaluation (Addendum)
 Anesthesia Evaluation  Patient identified by MRN, date of birth, ID band Patient awake    Reviewed: Allergy & Precautions, NPO status , Patient's Chart, lab work & pertinent test results  History of Anesthesia Complications (+) history of anesthetic complications (pt aspirated during leg lift surgery)  Airway Mallampati: II  TM Distance: >3 FB Neck ROM: Full    Dental  (+) Teeth Intact, Dental Advisory Given   Pulmonary sleep apnea    Pulmonary exam normal breath sounds clear to auscultation       Cardiovascular hypertension, Normal cardiovascular exam Rhythm:Regular Rate:Normal     Neuro/Psych Seizures -,  Lumbar radiculopathy  Neuromuscular disease    GI/Hepatic Neg liver ROS,GERD  ,,  Endo/Other  negative endocrine ROS    Renal/GU negative Renal ROS     Musculoskeletal  (+) Arthritis ,    Abdominal   Peds  (+) ATTENTION DEFICIT DISORDER WITHOUT HYPERACTIVITY Hematology negative hematology ROS (+)   Anesthesia Other Findings   Reproductive/Obstetrics                             Anesthesia Physical Anesthesia Plan  ASA: 3  Anesthesia Plan: General   Post-op Pain Management: Tylenol PO (pre-op)* and Gabapentin PO (pre-op)*   Induction: Intravenous  PONV Risk Score and Plan: 3 and Midazolam, Ondansetron, Scopolamine patch - Pre-op and Propofol infusion  Airway Management Planned: Oral ETT  Additional Equipment: Arterial line  Intra-op Plan:   Post-operative Plan: Extubation in OR  Informed Consent: I have reviewed the patients History and Physical, chart, labs and discussed the procedure including the risks, benefits and alternatives for the proposed anesthesia with the patient or authorized representative who has indicated his/her understanding and acceptance.     Dental advisory given  Plan Discussed with: CRNA  Anesthesia Plan Comments: (PAT note written 03/07/2024  by Shonna Chock, PA-C. Appears 02/08/24 EKG has some limb lead reversal, if updated EKG desired then will need order.   2nd PIV after induction )       Anesthesia Quick Evaluation

## 2024-03-07 NOTE — Progress Notes (Addendum)
 Anesthesia Chart Review:  Case: [id number] Date/Time: 03/15/24 0815   Procedures:      ANTERIOR LUMBAR FUSION 1 LEVEL - ANTERIOR LUMBAR INTERBODY FUSION LUMBAR 4- LUMBAR 5 WITH INSTRUMENTATION AND ALLOGRAFT WITH POSTERIOR SPINAL FUSION WITH INSTRUMENTATION AND ALLOGRAFT     POSTERIOR LUMBAR FUSION 1 LEVEL     ABDOMINAL EXPOSURE   Anesthesia type: General   Diagnosis: Lumbar radiculopathy [M54.16]   Pre-op diagnosis: LUMBAR RIDICULOPATHY   [facility] / [facility]   Surgeons: Estill Bamberg, MD; Cephus Shelling, MD       DISCUSSION: Patient is a 63 year old female scheduled for the above procedure.   History includes never smoker, HTN, dyspnea (2013), murmur (2013, no echo findings to explain murmur), goiter, GERD, pre-diabetes, seizure disorder (on Keppra, possibly associated with hypoglycemia, "mild OSA (does not use CPAP after weight loss), gastric bypass (2000), hysterectomy (with BSO 02/22/02), cholecystectomy (2014), breast augmentation, leg lift (2008), abdominoplasty, ostearthritis (right TSA 09/01/10). 2016 liver biopsy showed hepatic steatosis. She has a family history of Ehlers-Danlos syndrome (sister). She reported aspirating during leg lift surgery in 2008. LATEX allergy.   She had routine and preoperative evaluation by Worthy Rancher, NP on 02/08/24. EKG and labs were done.  It appears that some limb leads were reversed on EKG that day. Will defer to anesthesia team if updated EKG needed prior ro surgery. Anesthesia team to evaluate on the day of surgery.   VS: BP 116/74   Pulse 78   Temp 36.7 C   Resp 18   Ht 5\' 1"  (1.549 m)   Wt 63.3 kg   SpO2 99%   BMI 26.38 kg/m    PROVIDERS: Lucky Cowboy, MD was her PCP, recently deceased. Now with Worthy Rancher, NP. Windell Norfolk, MD is neurologist   LABS: Labs reviewed: Acceptable for surgery. LFTs on 02/08/24 showed AST 42 and ALT 57.  (all labs ordered are listed, but only abnormal results are displayed)  Labs  Reviewed  CBC - Abnormal; Notable for the following components:      Result Value   Hemoglobin 15.3 (*)    HCT 46.6 (*)    All other components within normal limits  SURGICAL PCR SCREEN  BASIC METABOLIC PANEL  TYPE AND SCREEN    OTHER: EEG 08/11/22: Impression: This is a normal EEG recording in the waking and drowsy state. No evidence of interictal epileptiform discharges seen. A normal EEG does not exclude a diagnosis of epilepsy.     IMAGES: MRI L-spine 12/24/23 (Canopy/PACS): MPRESSION: 1. Lumbar spondylosis and degenerative disc disease, causing moderate to prominent impingement at L4-5; and borderline impingement at L5-S1. 2. 8 mm anterolisthesis at L4-5 associated with bilateral L4 pars defects. 3. Transitional L5 vertebra with broad right transverse process pseudoarticulating with the sacrum. Careful correlation with this numbering strategy prior to any procedural intervention would be recommended.  CT Head 07/25/23: IMPRESSION: Normal head CT.  1V PCXR 07/25/23: MPRESSION: Expiratory chest with mild cardiomegaly. No evidence of acute chest process. Limited evaluation of the lower lung fields due to low inspiration.    EKG: EKG 02/08/24:  Normal sinus rhythm Left axis deviation Inferior infarct, age undetermined - EKG appears to have some reversed limb leads--consider RA/LL lead reversal.     CV: ETT 09/30/12: ETT Interpretation:  Overall, felt to be negative for ischemia.   Echo 09/22/12: Study Conclusions  - Left ventricle: The cavity size was normal. Wall thickness    was normal. Systolic function  was normal. The estimated    ejection fraction was in the range of 60% to 65%. Wall    motion was normal; there were no regional wall motion    abnormalities. Features are consistent with a pseudonormal    left ventricular filling pattern, with concomitant    abnormal relaxation and increased filling pressure (grade    2 diastolic dysfunction).  - Aortic  valve: There was no stenosis.  - Mitral valve: No significant regurgitation.  - Right ventricle: The cavity size was normal. Systolic    function was normal.  - Inferior vena cava: The vessel was normal in size; the    respirophasic diameter changes were in the normal range (=    50%); findings are consistent with normal central venous    pressure.  Impressions:   - Normal LV size and systolic function, EF 60-65%. Moderate    diastolic dysfunction. Normal RV size and systolic    function. No finding to explain murmur.   Past Medical History:  Diagnosis Date   ADD (attention deficit disorder) 07/19/2013   Arthritis    Cholecystitis, acute with cholelithiasis 07/19/2013   Chronic left shoulder pain 07/19/2013   Complication of anesthesia    pt aspirated during leg lift surgery.   Dyspnea    GERD (gastroesophageal reflux disease) 07/19/2013   Goiter    HTN (hypertension)    Hypoglycemia    Hypoglycemia 07/19/2013   Murmur    Neuromuscular disorder (HCC)    Pre-diabetes    Seizure disorder, primary generalized (HCC) 07/19/2013   May be related to hypoglycemia   Seizures (HCC) last 01/08/18   2013-grand mal due to hypoglycemia   Sleep apnea    "mild"-not using CPAP per pt    Past Surgical History:  Procedure Laterality Date   ABDOMINAL HYSTERECTOMY     BREAST ENHANCEMENT SURGERY     CHOLECYSTECTOMY N/A 07/20/2013   Procedure: LAPAROSCOPIC CHOLECYSTECTOMY WITH INTRAOPERATIVE CHOLANGIOGRAM;  Surgeon: Kandis Cocking, MD;  Location: [facility]S;  Service: General;  Laterality: N/A;   GASTRIC BYPASS     GASTRIC BYPASS     GASTRIC BYPASS  2000   JOINT REPLACEMENT Bilateral    knees   leg lift  2008   had aspiration in lungs, fluid was "drained" per pt/this was done at outpatient center/RM   Stomach tuck     TOTAL SHOULDER REPLACEMENT Right     MEDICATIONS:  acetaminophen (TYLENOL) 650 MG CR tablet   albuterol (VENTOLIN HFA) 108 (90 Base) MCG/ACT inhaler   b complex vitamins  tablet   Brimonidine Tartrate (LUMIFY) 0.025 % SOLN   Cyanocobalamin (B-12) 5000 MCG SUBL   gabapentin (NEURONTIN) 600 MG tablet   IRON, FERROUS SULFATE, PO   levETIRAcetam (KEPPRA) 500 MG tablet   loratadine (CLARITIN) 10 MG tablet   Midazolam (NAYZILAM) 5 MG/0.1ML SOLN   Milk Thistle 1000 MG CAPS   Multiple Vitamin (MULTIVITAMIN) tablet   Multiple Vitamins-Minerals (HAIR SKIN AND NAILS FORMULA PO)   psyllium (REGULOID) 0.52 g capsule   TURMERIC PO   Vitamin D, Ergocalciferol, (DRISDOL) 50000 units CAPS capsule   No current facility-administered medications for this encounter.    Shonna Chock, PA-C Surgical Short Stay/Anesthesiology [facility] Phone [phone] [facility] Phone [phone] 03/07/2024 1:47 PM

## 2024-03-08 ENCOUNTER — Telehealth: Payer: Self-pay | Admitting: Internal Medicine

## 2024-03-08 NOTE — Telephone Encounter (Signed)
 Copied from CRM [id number]. Topic: General - Other >> Mar 08, 2024 12:49 PM Aisha D wrote: Reason for CRM: Patient is stating that Lupita Leash from [facility] faxed over a request for Surgical clearance on 02/02/24. Patient stated that Lupita Leash informed her that she hasn't received a response from the clinic as of yet. Patient would like to have the surgical clearance response urgently faxed over to [phone] to Tobey Bride at [facility].

## 2024-03-10 NOTE — Telephone Encounter (Signed)
 Copied from CRM [id number]. Topic: General - Other >> Mar 10, 2024 10:05 AM Arley Phenix D wrote: Reason for CRM:  Patient is stating that Lupita Leash from [facility] faxed over a request for Surgical clearance on 02/02/24. Patient stated that Lupita Leash informed her that she hasn't received a response from the clinic as of yet. Patient would like to have the surgical clearance response urgently faxed over to [phone] to Tobey Bride at [facility]. Patient stated that she needs this urgently faxed today because she has the surgery on Wednesday.

## 2024-03-13 DIAGNOSIS — M25531 Pain in right wrist: Secondary | ICD-10-CM | POA: Diagnosis not present

## 2024-03-15 ENCOUNTER — Other Ambulatory Visit: Payer: Self-pay

## 2024-03-15 ENCOUNTER — Inpatient Hospital Stay (HOSPITAL_COMMUNITY)

## 2024-03-15 ENCOUNTER — Inpatient Hospital Stay (HOSPITAL_COMMUNITY)
Admission: RE | Admit: 2024-03-15 | Discharge: 2024-03-16 | DRG: 402 | Disposition: A | Discharge status: Home / Self Care | Attending: Orthopedic Surgery | Admitting: Orthopedic Surgery

## 2024-03-15 ENCOUNTER — Inpatient Hospital Stay (HOSPITAL_COMMUNITY): Payer: Self-pay | Admitting: Vascular Surgery

## 2024-03-15 ENCOUNTER — Inpatient Hospital Stay (HOSPITAL_COMMUNITY): Payer: Self-pay | Admitting: Certified Registered Nurse Anesthetist

## 2024-03-15 ENCOUNTER — Encounter (HOSPITAL_COMMUNITY): Payer: Self-pay | Admitting: Orthopedic Surgery

## 2024-03-15 ENCOUNTER — Encounter (HOSPITAL_COMMUNITY)
Admission: RE | Disposition: A | Discharge status: Home / Self Care | Payer: Self-pay | Source: Home / Self Care | Attending: Orthopedic Surgery

## 2024-03-15 DIAGNOSIS — Z90710 Acquired absence of both cervix and uterus: Secondary | ICD-10-CM

## 2024-03-15 DIAGNOSIS — Z560 Unemployment, unspecified: Secondary | ICD-10-CM

## 2024-03-15 DIAGNOSIS — Z9181 History of falling: Secondary | ICD-10-CM

## 2024-03-15 DIAGNOSIS — M5116 Intervertebral disc disorders with radiculopathy, lumbar region: Secondary | ICD-10-CM | POA: Diagnosis not present

## 2024-03-15 DIAGNOSIS — G40909 Epilepsy, unspecified, not intractable, without status epilepticus: Secondary | ICD-10-CM | POA: Diagnosis not present

## 2024-03-15 DIAGNOSIS — Z8249 Family history of ischemic heart disease and other diseases of the circulatory system: Secondary | ICD-10-CM | POA: Diagnosis not present

## 2024-03-15 DIAGNOSIS — R011 Cardiac murmur, unspecified: Secondary | ICD-10-CM | POA: Diagnosis present

## 2024-03-15 DIAGNOSIS — M48061 Spinal stenosis, lumbar region without neurogenic claudication: Secondary | ICD-10-CM | POA: Diagnosis not present

## 2024-03-15 DIAGNOSIS — G4730 Sleep apnea, unspecified: Secondary | ICD-10-CM | POA: Diagnosis present

## 2024-03-15 DIAGNOSIS — M25512 Pain in left shoulder: Secondary | ICD-10-CM | POA: Diagnosis present

## 2024-03-15 DIAGNOSIS — F988 Other specified behavioral and emotional disorders with onset usually occurring in childhood and adolescence: Secondary | ICD-10-CM | POA: Diagnosis not present

## 2024-03-15 DIAGNOSIS — M4316 Spondylolisthesis, lumbar region: Secondary | ICD-10-CM | POA: Diagnosis present

## 2024-03-15 DIAGNOSIS — M1990 Unspecified osteoarthritis, unspecified site: Secondary | ICD-10-CM | POA: Diagnosis present

## 2024-03-15 DIAGNOSIS — Z96611 Presence of right artificial shoulder joint: Secondary | ICD-10-CM | POA: Diagnosis present

## 2024-03-15 DIAGNOSIS — I10 Essential (primary) hypertension: Secondary | ICD-10-CM | POA: Diagnosis not present

## 2024-03-15 DIAGNOSIS — Z9884 Bariatric surgery status: Secondary | ICD-10-CM

## 2024-03-15 DIAGNOSIS — K219 Gastro-esophageal reflux disease without esophagitis: Secondary | ICD-10-CM | POA: Diagnosis present

## 2024-03-15 DIAGNOSIS — G8929 Other chronic pain: Secondary | ICD-10-CM | POA: Diagnosis present

## 2024-03-15 DIAGNOSIS — Z9049 Acquired absence of other specified parts of digestive tract: Secondary | ICD-10-CM | POA: Diagnosis not present

## 2024-03-15 DIAGNOSIS — Z91040 Latex allergy status: Secondary | ICD-10-CM | POA: Diagnosis not present

## 2024-03-15 DIAGNOSIS — R7303 Prediabetes: Secondary | ICD-10-CM | POA: Diagnosis present

## 2024-03-15 DIAGNOSIS — Z79899 Other long term (current) drug therapy: Secondary | ICD-10-CM

## 2024-03-15 DIAGNOSIS — M79606 Pain in leg, unspecified: Secondary | ICD-10-CM | POA: Diagnosis present

## 2024-03-15 DIAGNOSIS — Z833 Family history of diabetes mellitus: Secondary | ICD-10-CM | POA: Diagnosis not present

## 2024-03-15 DIAGNOSIS — M5416 Radiculopathy, lumbar region: Secondary | ICD-10-CM | POA: Diagnosis not present

## 2024-03-15 DIAGNOSIS — Z981 Arthrodesis status: Secondary | ICD-10-CM | POA: Diagnosis not present

## 2024-03-15 DIAGNOSIS — Z4682 Encounter for fitting and adjustment of non-vascular catheter: Secondary | ICD-10-CM | POA: Diagnosis not present

## 2024-03-15 SURGERY — ANTERIOR LUMBAR FUSION 1 LEVEL
Anesthesia: General | Site: Spine Lumbar

## 2024-03-15 MED ORDER — MIDAZOLAM HCL 2 MG/2ML IJ SOLN
INTRAMUSCULAR | Status: AC
  Filled 2024-03-15: qty 2

## 2024-03-15 MED ORDER — GABAPENTIN 300 MG PO CAPS
300.0000 mg | ORAL_CAPSULE | Freq: Once | ORAL | Status: AC
  Administered 2024-03-15: 300 mg via ORAL
  Filled 2024-03-15: qty 1

## 2024-03-15 MED ORDER — CHLORHEXIDINE GLUCONATE CLOTH 2 % EX PADS: 6.0000 | MEDICATED_PAD | Freq: Once | CUTANEOUS | Status: DC

## 2024-03-15 MED ORDER — SENNOSIDES-DOCUSATE SODIUM 8.6-50 MG PO TABS
1.0000 | ORAL_TABLET | Freq: Every evening | ORAL | Status: DC | PRN
  Administered 2024-03-15: 1 via ORAL
  Filled 2024-03-15: qty 1

## 2024-03-15 MED ORDER — FENTANYL CITRATE (PF) 100 MCG/2ML IJ SOLN
INTRAMUSCULAR | Status: AC
Start: 2024-03-15 — End: 2024-03-16
  Filled 2024-03-15: qty 2

## 2024-03-15 MED ORDER — LEVETIRACETAM 500 MG PO TABS
500.0000 mg | ORAL_TABLET | Freq: Two times a day (BID) | ORAL | Status: DC
  Administered 2024-03-16: 500 mg via ORAL
  Filled 2024-03-15 (×2): qty 1

## 2024-03-15 MED ORDER — IRON (FERROUS SULFATE) 75 (15 FE) MG/ML PO SOLN: 27.0000 mg | Freq: Every day | ORAL | Status: DC

## 2024-03-15 MED ORDER — 0.9 % SODIUM CHLORIDE (POUR BTL) OPTIME
TOPICAL | Status: DC | PRN
  Administered 2024-03-15: 1000 mL

## 2024-03-15 MED ORDER — THROMBIN 20000 UNITS EX SOLR
CUTANEOUS | Status: AC
  Filled 2024-03-15: qty 20000

## 2024-03-15 MED ORDER — ROCURONIUM BROMIDE 10 MG/ML (PF) SYRINGE
PREFILLED_SYRINGE | INTRAVENOUS | Status: DC | PRN
  Administered 2024-03-15: 50 mg via INTRAVENOUS
  Administered 2024-03-15: 30 mg via INTRAVENOUS
  Administered 2024-03-15: 50 mg via INTRAVENOUS

## 2024-03-15 MED ORDER — PANTOPRAZOLE SODIUM 40 MG IV SOLR
40.0000 mg | Freq: Every day | INTRAVENOUS | Status: DC
  Administered 2024-03-15: 40 mg via INTRAVENOUS
  Filled 2024-03-15: qty 10

## 2024-03-15 MED ORDER — DOCUSATE SODIUM 100 MG PO CAPS
100.0000 mg | ORAL_CAPSULE | Freq: Two times a day (BID) | ORAL | Status: DC
  Administered 2024-03-15 – 2024-03-16 (×3): 100 mg via ORAL
  Filled 2024-03-15 (×3): qty 1

## 2024-03-15 MED ORDER — ONDANSETRON HCL 4 MG/2ML IJ SOLN: 4.0000 mg | Freq: Once | INTRAMUSCULAR | Status: DC | PRN

## 2024-03-15 MED ORDER — PROPOFOL 500 MG/50ML IV EMUL
INTRAVENOUS | Status: DC | PRN
Start: 2024-03-15 — End: 2024-03-15
  Administered 2024-03-15: 25 ug/kg/min via INTRAVENOUS

## 2024-03-15 MED ORDER — MENTHOL 3 MG MT LOZG: 1.0000 | LOZENGE | OROMUCOSAL | Status: DC | PRN

## 2024-03-15 MED ORDER — SODIUM CHLORIDE 0.9 % IV SOLN
250.0000 mL | INTRAVENOUS | Status: DC
  Administered 2024-03-15: 250 mL via INTRAVENOUS

## 2024-03-15 MED ORDER — BUPIVACAINE-EPINEPHRINE (PF) 0.25% -1:200000 IJ SOLN
INTRAMUSCULAR | Status: AC
  Filled 2024-03-15: qty 30

## 2024-03-15 MED ORDER — ALBUMIN HUMAN 5 % IV SOLN: INTRAVENOUS | Status: DC | PRN

## 2024-03-15 MED ORDER — BUPIVACAINE-EPINEPHRINE 0.25% -1:200000 IJ SOLN
INTRAMUSCULAR | Status: DC | PRN
  Administered 2024-03-15: 6 mL

## 2024-03-15 MED ORDER — SODIUM CHLORIDE 0.9% FLUSH
3.0000 mL | Freq: Two times a day (BID) | INTRAVENOUS | Status: DC
Start: 2024-03-15 — End: 2024-03-16
  Administered 2024-03-15: 3 mL via INTRAVENOUS

## 2024-03-15 MED ORDER — FENTANYL CITRATE (PF) 250 MCG/5ML IJ SOLN
INTRAMUSCULAR | Status: DC | PRN
  Administered 2024-03-15 (×2): 50 ug via INTRAVENOUS
  Administered 2024-03-15: 100 ug via INTRAVENOUS
  Administered 2024-03-15: 50 ug via INTRAVENOUS

## 2024-03-15 MED ORDER — PROPOFOL 10 MG/ML IV BOLUS
INTRAVENOUS | Status: DC | PRN
  Administered 2024-03-15: 100 mg via INTRAVENOUS

## 2024-03-15 MED ORDER — OXYCODONE-ACETAMINOPHEN 5-325 MG PO TABS
1.0000 | ORAL_TABLET | ORAL | Status: DC | PRN
  Administered 2024-03-15 – 2024-03-16 (×4): 2 via ORAL
  Filled 2024-03-15 (×4): qty 2

## 2024-03-15 MED ORDER — FLEET ENEMA RE ENEM: 1.0000 | ENEMA | Freq: Once | RECTAL | Status: DC | PRN

## 2024-03-15 MED ORDER — ONDANSETRON HCL 4 MG/2ML IJ SOLN
INTRAMUSCULAR | Status: DC | PRN
  Administered 2024-03-15: 4 mg via INTRAVENOUS

## 2024-03-15 MED ORDER — PHENYLEPHRINE 80 MCG/ML (10ML) SYRINGE FOR IV PUSH (FOR BLOOD PRESSURE SUPPORT)
PREFILLED_SYRINGE | INTRAVENOUS | Status: DC | PRN
  Administered 2024-03-15: 80 ug via INTRAVENOUS
  Administered 2024-03-15: 160 ug via INTRAVENOUS

## 2024-03-15 MED ORDER — SODIUM CHLORIDE 0.9% FLUSH: 3.0000 mL | INTRAVENOUS | Status: DC | PRN

## 2024-03-15 MED ORDER — ROCURONIUM BROMIDE 10 MG/ML (PF) SYRINGE
PREFILLED_SYRINGE | INTRAVENOUS | Status: AC
  Filled 2024-03-15: qty 10

## 2024-03-15 MED ORDER — PROPOFOL 10 MG/ML IV BOLUS
INTRAVENOUS | Status: AC
  Filled 2024-03-15: qty 20

## 2024-03-15 MED ORDER — CEFAZOLIN SODIUM-DEXTROSE 2-4 GM/100ML-% IV SOLN
2.0000 g | INTRAVENOUS | Status: AC
  Administered 2024-03-15: 2 g via INTRAVENOUS
  Filled 2024-03-15: qty 100

## 2024-03-15 MED ORDER — CEFAZOLIN SODIUM-DEXTROSE 2-4 GM/100ML-% IV SOLN
2.0000 g | Freq: Three times a day (TID) | INTRAVENOUS | Status: AC
  Administered 2024-03-15 (×2): 2 g via INTRAVENOUS
  Filled 2024-03-15 (×2): qty 100

## 2024-03-15 MED ORDER — POVIDONE-IODINE 7.5 % EX SOLN
Freq: Once | CUTANEOUS | Status: DC
  Filled 2024-03-15: qty 118

## 2024-03-15 MED ORDER — HYDROCODONE-ACETAMINOPHEN 5-325 MG PO TABS: 1.0000 | ORAL_TABLET | ORAL | Status: DC | PRN

## 2024-03-15 MED ORDER — ONDANSETRON HCL 4 MG/2ML IJ SOLN
INTRAMUSCULAR | Status: AC
  Filled 2024-03-15: qty 2

## 2024-03-15 MED ORDER — LORATADINE 10 MG PO TABS
10.0000 mg | ORAL_TABLET | Freq: Every day | ORAL | Status: DC
  Administered 2024-03-16: 10 mg via ORAL
  Filled 2024-03-15: qty 1

## 2024-03-15 MED ORDER — LIDOCAINE 2% (20 MG/ML) 5 ML SYRINGE
INTRAMUSCULAR | Status: DC | PRN
  Administered 2024-03-15: 60 mg via INTRAVENOUS

## 2024-03-15 MED ORDER — GABAPENTIN 300 MG PO CAPS: 600.0000 mg | ORAL_CAPSULE | Freq: Every day | ORAL | Status: DC

## 2024-03-15 MED ORDER — MORPHINE SULFATE (PF) 2 MG/ML IV SOLN: 1.0000 mg | INTRAVENOUS | Status: DC | PRN

## 2024-03-15 MED ORDER — SUGAMMADEX SODIUM 200 MG/2ML IV SOLN
INTRAVENOUS | Status: DC | PRN
  Administered 2024-03-15: 200 mg via INTRAVENOUS

## 2024-03-15 MED ORDER — ZOLPIDEM TARTRATE 5 MG PO TABS: 5.0000 mg | ORAL_TABLET | Freq: Every evening | ORAL | Status: DC | PRN

## 2024-03-15 MED ORDER — METHOCARBAMOL 500 MG PO TABS
500.0000 mg | ORAL_TABLET | Freq: Four times a day (QID) | ORAL | Status: DC | PRN
  Administered 2024-03-15 – 2024-03-16 (×2): 500 mg via ORAL
  Filled 2024-03-15 (×3): qty 1

## 2024-03-15 MED ORDER — DEXMEDETOMIDINE HCL IN NACL 80 MCG/20ML IV SOLN
INTRAVENOUS | Status: DC | PRN
  Administered 2024-03-15: 8 ug via INTRAVENOUS
  Administered 2024-03-15: 4 ug via INTRAVENOUS
  Administered 2024-03-15: 8 ug via INTRAVENOUS

## 2024-03-15 MED ORDER — LACTATED RINGERS IV SOLN: INTRAVENOUS | Status: DC | PRN

## 2024-03-15 MED ORDER — METHOCARBAMOL 1000 MG/10ML IJ SOLN: 500.0000 mg | Freq: Four times a day (QID) | INTRAMUSCULAR | Status: DC | PRN

## 2024-03-15 MED ORDER — FENTANYL CITRATE (PF) 100 MCG/2ML IJ SOLN
25.0000 ug | INTRAMUSCULAR | Status: DC | PRN
Start: 2024-03-15 — End: 2024-03-15
  Administered 2024-03-15 (×4): 25 ug via INTRAVENOUS

## 2024-03-15 MED ORDER — ONDANSETRON HCL 4 MG PO TABS: 4.0000 mg | ORAL_TABLET | Freq: Four times a day (QID) | ORAL | Status: DC | PRN

## 2024-03-15 MED ORDER — AMISULPRIDE (ANTIEMETIC) 5 MG/2ML IV SOLN: 10.0000 mg | Freq: Once | INTRAVENOUS | Status: DC | PRN

## 2024-03-15 MED ORDER — DEXAMETHASONE SODIUM PHOSPHATE 10 MG/ML IJ SOLN
INTRAMUSCULAR | Status: AC
Start: 2024-03-15 — End: ?
  Filled 2024-03-15: qty 1

## 2024-03-15 MED ORDER — THROMBIN 20000 UNITS EX SOLR
CUTANEOUS | Status: DC | PRN
  Administered 2024-03-15: 20 mL via TOPICAL

## 2024-03-15 MED ORDER — ACETAMINOPHEN 500 MG PO TABS
1000.0000 mg | ORAL_TABLET | Freq: Once | ORAL | Status: AC
  Administered 2024-03-15: 1000 mg via ORAL
  Filled 2024-03-15: qty 2

## 2024-03-15 MED ORDER — PHENYLEPHRINE 80 MCG/ML (10ML) SYRINGE FOR IV PUSH (FOR BLOOD PRESSURE SUPPORT)
PREFILLED_SYRINGE | INTRAVENOUS | Status: AC
  Filled 2024-03-15: qty 10

## 2024-03-15 MED ORDER — BISACODYL 5 MG PO TBEC: 5.0000 mg | DELAYED_RELEASE_TABLET | Freq: Every day | ORAL | Status: DC | PRN

## 2024-03-15 MED ORDER — CHLORHEXIDINE GLUCONATE 0.12 % MT SOLN
15.0000 mL | Freq: Once | OROMUCOSAL | Status: AC
  Administered 2024-03-15: 15 mL via OROMUCOSAL
  Filled 2024-03-15: qty 15

## 2024-03-15 MED ORDER — LIDOCAINE 2% (20 MG/ML) 5 ML SYRINGE
INTRAMUSCULAR | Status: AC
  Filled 2024-03-15: qty 5

## 2024-03-15 MED ORDER — FENTANYL CITRATE (PF) 250 MCG/5ML IJ SOLN
INTRAMUSCULAR | Status: AC
  Filled 2024-03-15: qty 5

## 2024-03-15 MED ORDER — SODIUM CHLORIDE 0.9% FLUSH
3.0000 mL | Freq: Two times a day (BID) | INTRAVENOUS | Status: DC
  Administered 2024-03-15: 3 mL via INTRAVENOUS

## 2024-03-15 MED ORDER — ACETAMINOPHEN 650 MG RE SUPP: 650.0000 mg | RECTAL | Status: DC | PRN

## 2024-03-15 MED ORDER — BRIMONIDINE TARTRATE 0.025 % OP SOLN: 1.0000 [drp] | Freq: Every day | OPHTHALMIC | Status: DC | PRN

## 2024-03-15 MED ORDER — ADULT MULTIVITAMIN W/MINERALS CH
1.0000 | ORAL_TABLET | Freq: Every day | ORAL | Status: DC
  Administered 2024-03-15 – 2024-03-16 (×2): 1 via ORAL
  Filled 2024-03-15 (×2): qty 1

## 2024-03-15 MED ORDER — DEXMEDETOMIDINE HCL IN NACL 80 MCG/20ML IV SOLN
INTRAVENOUS | Status: AC
  Filled 2024-03-15: qty 20

## 2024-03-15 MED ORDER — DEXAMETHASONE SODIUM PHOSPHATE 10 MG/ML IJ SOLN
INTRAMUSCULAR | Status: DC | PRN
  Administered 2024-03-15: 10 mg via INTRAVENOUS

## 2024-03-15 MED ORDER — SCOPOLAMINE 1 MG/3DAYS TD PT72
1.0000 | MEDICATED_PATCH | Freq: Once | TRANSDERMAL | Status: DC
  Administered 2024-03-15: 1.5 mg via TRANSDERMAL
  Filled 2024-03-15: qty 1

## 2024-03-15 MED ORDER — ACETAMINOPHEN 325 MG PO TABS: 650.0000 mg | ORAL_TABLET | ORAL | Status: DC | PRN

## 2024-03-15 MED ORDER — MIDAZOLAM HCL 2 MG/2ML IJ SOLN
INTRAMUSCULAR | Status: DC | PRN
  Administered 2024-03-15: 2 mg via INTRAVENOUS

## 2024-03-15 MED ORDER — BUPIVACAINE-EPINEPHRINE (PF) 0.25% -1:200000 IJ SOLN
INTRAMUSCULAR | Status: DC | PRN
  Administered 2024-03-15: 40 mL

## 2024-03-15 MED ORDER — VITAMIN D (ERGOCALCIFEROL) 1.25 MG (50000 UNIT) PO CAPS: 50000.0000 [IU] | ORAL_CAPSULE | Freq: Every day | ORAL | Status: DC

## 2024-03-15 MED ORDER — STERILE WATER FOR IRRIGATION IR SOLN
Status: DC | PRN
Start: 2024-03-15 — End: 2024-03-15
  Administered 2024-03-15: 1000 mL

## 2024-03-15 MED ORDER — ORAL CARE MOUTH RINSE: 15.0000 mL | Freq: Once | OROMUCOSAL | Status: AC

## 2024-03-15 MED ORDER — ALUM & MAG HYDROXIDE-SIMETH 200-200-20 MG/5ML PO SUSP: 30.0000 mL | Freq: Four times a day (QID) | ORAL | Status: DC | PRN

## 2024-03-15 MED ORDER — ONDANSETRON HCL 4 MG/2ML IJ SOLN
4.0000 mg | Freq: Four times a day (QID) | INTRAMUSCULAR | Status: DC | PRN
  Administered 2024-03-15: 4 mg via INTRAVENOUS
  Filled 2024-03-15: qty 2

## 2024-03-15 MED ORDER — PHENOL 1.4 % MT LIQD: 1.0000 | OROMUCOSAL | Status: DC | PRN

## 2024-03-15 MED ORDER — PHENYLEPHRINE HCL-NACL 20-0.9 MG/250ML-% IV SOLN
INTRAVENOUS | Status: DC | PRN
  Administered 2024-03-15: 15 ug/min via INTRAVENOUS

## 2024-03-15 MED ORDER — BUPIVACAINE LIPOSOME 1.3 % IJ SUSP
INTRAMUSCULAR | Status: AC
  Filled 2024-03-15: qty 20

## 2024-03-15 SURGICAL SUPPLY — 116 items
APPLIER CLIP 11 MED OPEN (CLIP) ×2 IMPLANT
BAG COUNTER SPONGE SURGICOUNT (BAG) ×6 IMPLANT
BENZOIN TINCTURE PRP APPL 2/3 (GAUZE/BANDAGES/DRESSINGS) ×2 IMPLANT
BLADE CLIPPER SURG (BLADE) IMPLANT
BLADE SURG 10 STRL SS (BLADE) ×2 IMPLANT
BONE VIVIGEN FORMABLE 5.4CC (Bone Implant) ×2 IMPLANT
BUR PRESCISION 1.7 ELITE (BURR) ×2 IMPLANT
BUR ROUND FLUTED 5 RND (BURR) ×2 IMPLANT
BUR ROUND PRECISION 4.0 (BURR) IMPLANT
BUR SABER RD CUTTING 3.0 (BURR) IMPLANT
CLIP APPLIE 11 MED OPEN (CLIP) ×4 IMPLANT
CLIP LIGATING EXTRA MED SLVR (CLIP) IMPLANT
CNTNR URN SCR LID CUP LEK RST (MISCELLANEOUS) ×2 IMPLANT
CORD BIPOLAR FORCEPS 12FT (ELECTRODE) ×2 IMPLANT
COVER MAYO STAND STRL (DRAPES) ×4 IMPLANT
COVER SURGICAL LIGHT HANDLE (MISCELLANEOUS) ×4 IMPLANT
DRAIN CHANNEL 15F RND FF W/TCR (WOUND CARE) IMPLANT
DRAPE C-ARM 42X72 X-RAY (DRAPES) ×6 IMPLANT
DRAPE C-ARMOR (DRAPES) IMPLANT
DRAPE INCISE IOBAN 66X45 STRL (DRAPES) IMPLANT
DRAPE POUCH INSTRU U-SHP 10X18 (DRAPES) ×4 IMPLANT
DRAPE SURG 17X23 STRL (DRAPES) ×14 IMPLANT
DRSG MEPILEX POST OP 4X12 (GAUZE/BANDAGES/DRESSINGS) ×2 IMPLANT
DURAPREP 26ML APPLICATOR (WOUND CARE) ×4 IMPLANT
ELECT BLADE 4.0 EZ CLEAN MEGAD (MISCELLANEOUS) ×6 IMPLANT
ELECT CAUTERY BLADE 6.4 (BLADE) ×4 IMPLANT
ELECT REM PT RETURN 9FT ADLT (ELECTROSURGICAL) ×2 IMPLANT
ELECTRODE BLDE 4.0 EZ CLN MEGD (MISCELLANEOUS) ×6 IMPLANT
ELECTRODE REM PT RTRN 9FT ADLT (ELECTROSURGICAL) ×4 IMPLANT
EVACUATOR SILICONE 100CC (DRAIN) IMPLANT
FILTER STRAW FLUID ASPIR (MISCELLANEOUS) ×2 IMPLANT
GAUZE 4X4 16PLY ~~LOC~~+RFID DBL (SPONGE) ×2 IMPLANT
GAUZE SPONGE 4X4 12PLY STRL (GAUZE/BANDAGES/DRESSINGS) ×2 IMPLANT
GLOVE BIO SURGEON STRL SZ 6.5 (GLOVE) ×4 IMPLANT
GLOVE BIO SURGEON STRL SZ7.5 (GLOVE) ×2 IMPLANT
GLOVE BIO SURGEON STRL SZ8 (GLOVE) ×4 IMPLANT
GLOVE BIOGEL PI IND STRL 7.0 (GLOVE) ×4 IMPLANT
GLOVE BIOGEL PI IND STRL 8 (GLOVE) ×8 IMPLANT
GLOVE SURG ENC MOIS LTX SZ6.5 (GLOVE) ×4 IMPLANT
GOWN STRL REUS W/ TWL LRG LVL3 (GOWN DISPOSABLE) ×8 IMPLANT
GOWN STRL REUS W/ TWL XL LVL3 (GOWN DISPOSABLE) ×6 IMPLANT
GRAFT BNE MATRIX VG FRMBL MD 5 (Bone Implant) IMPLANT
GUIDEWIRE SHARP VIPER II (WIRE) IMPLANT
HEMOSTAT SURGICEL 2X14 (HEMOSTASIS) IMPLANT
INSERT FOGARTY 61MM (MISCELLANEOUS) IMPLANT
INSERT FOGARTY SM (MISCELLANEOUS) IMPLANT
IV CATH 14GX2 1/4 (CATHETERS) ×2 IMPLANT
KIT ALARA NEURO ACCESS (KITS) IMPLANT
KIT BASIN OR (CUSTOM PROCEDURE TRAY) ×4 IMPLANT
KIT POSITION SURG JACKSON T1 (MISCELLANEOUS) ×2 IMPLANT
KIT TURNOVER KIT B (KITS) ×4 IMPLANT
MARKER SKIN DUAL TIP RULER LAB (MISCELLANEOUS) ×4 IMPLANT
NDL 18GX1X1/2 (RX/OR ONLY) (NEEDLE) ×2 IMPLANT
NDL 22X1.5 STRL (OR ONLY) (MISCELLANEOUS) ×4 IMPLANT
NDL HYPO 25GX1X1/2 BEV (NEEDLE) ×4 IMPLANT
NDL SPNL 18GX3.5 QUINCKE PK (NEEDLE) ×6 IMPLANT
NEEDLE 18GX1X1/2 (RX/OR ONLY) (NEEDLE) IMPLANT
NEEDLE 22X1.5 STRL (OR ONLY) (MISCELLANEOUS) ×2 IMPLANT
NEEDLE HYPO 25GX1X1/2 BEV (NEEDLE) ×4 IMPLANT
NEEDLE SPNL 18GX3.5 QUINCKE PK (NEEDLE) ×4 IMPLANT
NS IRRIG 1000ML POUR BTL (IV SOLUTION) ×4 IMPLANT
PACK LAMINECTOMY ORTHO (CUSTOM PROCEDURE TRAY) ×4 IMPLANT
PACK UNIVERSAL I (CUSTOM PROCEDURE TRAY) ×4 IMPLANT
PAD ARMBOARD POSITIONER FOAM (MISCELLANEOUS) ×12 IMPLANT
PATTIES SURGICAL .5 X1 (DISPOSABLE) ×2 IMPLANT
PATTIES SURGICAL .5X1.5 (GAUZE/BANDAGES/DRESSINGS) ×2 IMPLANT
PENCIL BUTTON HOLSTER BLD 10FT (ELECTRODE) IMPLANT
PUTTY DBX 2.5CC (Putty) ×2 IMPLANT
PUTTY DBX 2.5CC DEPUY (Putty) IMPLANT
ROD VIPER II LORDOSED 5.5X40 (Rod) IMPLANT
ROD VIPER2 5.5X45 PRE LARDOSED (Rod) IMPLANT
SCREW SET SINGLE INNER MIS (Screw) IMPLANT
SCREW VIPER II XTAB POLY 7X45M (Screw) IMPLANT
SCREW VIPER X-TAB 6.0X40 (Screw) IMPLANT
SPACER ALIF MAGNIFY 31X10X25 8 (Spacer) IMPLANT
SPONGE INTESTINAL PEANUT (DISPOSABLE) ×10 IMPLANT
SPONGE SURGIFOAM ABS GEL 100 (HEMOSTASIS) ×6 IMPLANT
SPONGE T-LAP 18X18 ~~LOC~~+RFID (SPONGE) ×2 IMPLANT
STRIP CLOSURE SKIN 1/2X4 (GAUZE/BANDAGES/DRESSINGS) ×4 IMPLANT
SURGIFLO W/THROMBIN 8M KIT (HEMOSTASIS) IMPLANT
SUT MNCRL AB 4-0 PS2 18 (SUTURE) ×4 IMPLANT
SUT PDS AB 1 CTX 36 (SUTURE) ×4 IMPLANT
SUT PROLENE 4-0 RB1 .5 CRCL 36 (SUTURE) IMPLANT
SUT PROLENE 5 0 C 1 24 (SUTURE) IMPLANT
SUT PROLENE 5 0 CC1 (SUTURE) IMPLANT
SUT PROLENE 6 0 C 1 30 (SUTURE) IMPLANT
SUT PROLENE 6 0 CC (SUTURE) IMPLANT
SUT SILK 0 TIES 10X30 (SUTURE) IMPLANT
SUT SILK 2 0 TIES 10X30 (SUTURE) ×4 IMPLANT
SUT SILK 2 0SH CR/8 30 (SUTURE) IMPLANT
SUT SILK 3 0 TIES 10X30 (SUTURE) ×2 IMPLANT
SUT SILK 3 0SH CR/8 30 (SUTURE) IMPLANT
SUT SILK 3-0 18XBRD TIE BLK (SUTURE) IMPLANT
SUT VIC AB 0 CT1 18XCR BRD 8 (SUTURE) ×2 IMPLANT
SUT VIC AB 1 CT1 18XCR BRD 8 (SUTURE) ×2 IMPLANT
SUT VIC AB 1 CT1 27XBRD ANBCTR (SUTURE) ×4 IMPLANT
SUT VIC AB 1 CTX36XBRD ANBCTR (SUTURE) ×4 IMPLANT
SUT VIC AB 2-0 CT2 18 VCP726D (SUTURE) ×4 IMPLANT
SYR 20ML LL LF (SYRINGE) ×4 IMPLANT
SYR BULB IRRIG 60ML STRL (SYRINGE) ×4 IMPLANT
SYR CONTROL 10ML LL (SYRINGE) ×4 IMPLANT
SYR TB 1ML LUER SLIP (SYRINGE) ×2 IMPLANT
TABS EXTENSION VIPER 6X35 (Neuro Prosthesis/Implant) IMPLANT
TAP CANN VIPER2 DL 5.0 (TAP) IMPLANT
TAP CANN VIPER2 DL 6.0 (TAP) IMPLANT
TAPE CLOTH SURG 4X10 WHT LF (GAUZE/BANDAGES/DRESSINGS) IMPLANT
TAPE PAPER 3X10 WHT MICROPORE (GAUZE/BANDAGES/DRESSINGS) IMPLANT
TOWEL GREEN STERILE (TOWEL DISPOSABLE) ×4 IMPLANT
TOWEL GREEN STERILE FF (TOWEL DISPOSABLE) ×2 IMPLANT
TRAY FOLEY MTR SLVR 16FR STAT (SET/KITS/TRAYS/PACK) ×2 IMPLANT
TRAY FOLEY W/BAG SLVR 16FR ST (SET/KITS/TRAYS/PACK) ×2 IMPLANT
TUBE CONNECTING 20X1/4 (TUBING) IMPLANT
TUBING ANTICOAG CELL SAVER (IV SETS) IMPLANT
WATER STERILE IRR 1000ML POUR (IV SOLUTION) ×4 IMPLANT
X-TABS VIPER 6X35 (Neuro Prosthesis/Implant) ×2 IMPLANT
YANKAUER SUCT BULB TIP NO VENT (SUCTIONS) ×4 IMPLANT

## 2024-03-15 NOTE — Op Note (Signed)
 Date: March 15, 2024  Preoperative diagnosis: Lumbar radiculopathy with chronic back pain and L4-L5 spinal stenosis with degenerative disc disease  Postoperative diagnosis: Same  Procedure: Anterior spine exposure via anterior retroperitoneal approach for L4-L5 ALIF  Surgeon: Dr. Cephus Shelling, MD  Co-surgeon: Dr. Estill Bamberg, MD  Indications: 63 year old female with chronic lower back pain that has L4-L5 spinal stenosis with instability and lumbar radiculopathy.  She presents for anterior spine exposure of the L4-5 disc space after risks-benefits discussed.  Findings: L4-L5 disc space was marked over the left rectus muscle with fluoroscopic C arm.  Paramedian incision was made here and I opened the anterior rectus sheath and mobilized left rectus to the midline.  The retroperitoneum was entered and I took peritoneum off of posterior rectus sheath and opened the posterior sheath above arcuate line.  Mobilized at the L4-L5 disc space including ligating the middle sacral's between clips.  The left hypogastric vein was ligated as this was a high takeoff and there was not enough room to mobilize.  I then placed a fixed NuVasive retractor and confirmed we were at the correct level.  Anesthesia: General  Details: Patient was taken to the operating room after informed consent was obtained.  Placed on operative table in supine position.  General endotracheal anesthesia was induced.  Fluoroscopic C-arm was then used in the lateral position to mark the L4-L5 disc space over the left rectus muscle.  The abdominal wall was then prepped and draped in standard sterile fashion.  Antibiotics were given and timeout performed.  I made a paramedian incision over the left rectus muscle over the L4-L5 lumbar disc space.  Dissected down with Bovie cautery and used cerebllum retractors.  The anterior rectus sheath was opened longitudinally with bovie cautery and then I used hemostats and raised flaps underneath  the anterior rectus sheath mobilized left rectus to the midline.  I then entered into the retroperitoneum and mobilized peritoneum and left ureter to the midline including mobilizing peritoneum off of the posterior rectus sheath above arcuate line.  I did open some of the posterior sheath above arcuate line with Metzenbaum scissors.  Dr. Yevette Edwards then used hand-held Wiley retractors and pulled peritoneum and left ureter to the midline.  I visualized the L4-L5 disc space.  The middle sacral vessels were divided between clips.  There was a high takeoff of the left hypogastric vein and this was ligated between 2-0 silk ties and divided as I did not have enough room to mobilize this without significant tension on the vessel.  I then fully mobilized the left iliac vein lateral to the disc space with suction and Kd.  I then moved mobilized to the right of the disc space.  Fixed NuVasive retractor was placed on the field.  I placed 140 reverse lips on each side of the disc space with a 100 reverse lip cranial and 120 reverse lip caudal.  We had good anterior exposure.  A needle was placed in the disc space.  Confirmed we were at the correct level on lateral fluoroscopy and we were at the correct L4-5 level.  Case was turned over to Dr. Yevette Edwards.  Complication: None  Condition: Stable  Cephus Shelling, MD [facility] Office: [phone]   Cephus Shelling

## 2024-03-15 NOTE — Op Note (Signed)
 PATIENT NAME: Emily Graham   MEDICAL RECORD NO.:   [id number]    DATE OF BIRTH: 10/10/1961  DATE OF PROCEDURE: 03/15/2024                                OPERATIVE REPORT   PREOPERATIVE DIAGNOSES: 1. Axial low back pain 2. Bilateral lumbar radiculopathy 3. L4/5 degenerative disk disease 4. L4/5 spondylolisthesis 5. L4/5 spinal stenosis  POSTOPERATIVE DIAGNOSES: 1. Axial low back pain 2. Bilateral lumbar radiculopathy 3. L4/5 degenerative disk disease 4. L4/5 spondylolisthesis 5. L4/5 spinal stenosis  PROCEDURE: 1. Anterior lumbar interbody fusion, L4-5 2. Insertion of interbody device x 1 (Globus expandable intervertebral spacer) 3. Posterior spinal fusion, L4-5 4. Placement of posterior instrumentation, L4. L5 5. Use of morselized (Vivigen) 6. Retroperitoneal exposure to L4-5 (with Dr. Clotilde Dieter as co-surgeon) 7. Intraoperative use of fluoroscopy  SURGEON:  Estill Bamberg, MD  ASSISTANT:  Jason Coop, PA-C  ANESTHESIA:  General endotracheal anesthesia.  COMPLICATIONS:  None.  DISPOSITION:  Stable.  ESTIMATED BLOOD LOSS:  Minimal  INDICATIONS FOR SURGERY:  Briefly, Emily Graham is a very pleasant 63 y.o. -year- old female, who did present to me with severe and debilitating pain in both her back and bilateral legs.  The patient's MRI was notable for the findings outlined above.  The patient is failed multiple nonoperative measures over the course of many years, including various injections and physical therapy.  Given her ongoing pain and dysfunction, we did discuss proceeding with the procedure noted above.  The patient was fully aware that surgery may or may not alleviate her ongoing symptoms, particularly her low back pain. The patient did elect to proceed.  OPERATIVE DETAILS:  On 03/15/2024, the patient was brought to surgery and general endotracheal anesthesia was administered.  The patient was placed supine on the hospital bed.  The patient's abdomen was  prepped and draped in the usual sterile fashion.  An anterior retroperitoneal approach was then performed by myself, and Dr. Clotilde Dieter, the details of which will dictated on a separate note. I did function as his cosurgeon during the approach.  Once the anterior lumbar spine was noted, we did focus our attention on the L4-5 intervertebral space.  I then performed a thorough and complete L4-5 intervertebral diskectomy to the level of the posterior longitudinal ligament.  I was very pleased with the diskectomy that I was able to accomplish.  The endplates were then appropriately prepared and the appropriate sized anterior intervertebral spacer was packed with Vivigen and tamped into position. The intervertebral spacer was then expanded to approximately 14 mm in height. I was very pleased with the press-fit of the implant.  I was very pleased with the final AP and lateral fluoroscopic images.  The wound was copiously irrigated.  The fascia was closed using #1 PDS.  The subcutaneous layer was closed using 0 Vicryl followed by 2-0 Vicryl, and the skin was closed using 4-0 Monocryl. Benzoin and Steri-Strips were applied followed by sterile dressing.    The patient was then rolled prone onto a Jackson spinal bed.  The back was then prepped and draped in the usual sterile fashion.  I then made paramedian incisions on the right and left sides, just lateral to the lateral borders of the pedicles of L4 and L5.  On the left side, the posterolateral gutter and posterior elements of L4/5 were identified and exposed and decorticated.  The remainder of  the Vivigen was packed into the posterolateral gutter on the left side to aid in the success of the posterior fusion.  I then tapped the L4 and L5 pedicles bilaterally up to a 7 mm tap.  The patient's anatomy was quite unique. The left L5 pedicle was very small and both L5 pedicles were lateral. I was however able to cannulate the pedicles, place guide  wires, and place pedicles screws into the bilateral L4 and L5 pedicles.  Rods were then secured into the tulip heads of the screws bilaterally.  Caps were then placed and a final locking procedure was performed.  I was very pleased with the final AP and lateral fluoroscopic images.  The wound was then copiously irrigated.  On the right and left sides, the fascia was closed using #1 Vicryl.  The subcutaneous layer was closed using 0 Vicryl followed by 2-0 Vicryl, and the skin was then closed using 4-0 Monocryl. Benzoin and Steri-Strips were applied followed by sterile dressing.  All instrument counts were correct at the termination of the procedure.  Of note, Jason Coop was my assistant throughout surgery, and did aid in retraction, placement of the hardware, suctioning, and closure for both the anterior and posterior portions of the procedure.   Estill Bamberg, MD

## 2024-03-15 NOTE — Anesthesia Procedure Notes (Signed)
 Arterial Line Insertion Start/End4/01/2024 8:42 AM, 03/15/2024 8:50 AM Performed by: Collene Schlichter, MD, anesthesiologist  Patient location: OR. Preanesthetic checklist: patient identified, IV checked, site marked, risks and benefits discussed, surgical consent, monitors and equipment checked, pre-op evaluation, timeout performed and anesthesia consent Lidocaine 1% used for infiltration Right, radial was placed Catheter size: 20 G Hand hygiene performed  and maximum sterile barriers used   Attempts: 2 Procedure performed using ultrasound guided technique. Ultrasound Notes:anatomy identified, needle tip was noted to be adjacent to the nerve/plexus identified, no ultrasound evidence of intravascular and/or intraneural injection and image(s) printed for medical record Following insertion, dressing applied and Biopatch. Post procedure assessment: normal and unchanged  Patient tolerated the procedure well with no immediate complications.

## 2024-03-15 NOTE — Transfer of Care (Cosign Needed)
 Immediate Anesthesia Transfer of Care Note  Patient: Emily Graham  Procedure(s) Performed: ANTERIOR LUMBAR FUSION 1 LEVEL (Spine Lumbar) POSTERIOR LUMBAR FUSION 1 LEVEL (Spine Lumbar) ABDOMINAL EXPOSURE (Abdomen)  Patient Location: PACU  Anesthesia Type:General  Level of Consciousness: drowsy, patient cooperative, and responds to stimulation  Airway & Oxygen Therapy: Patient Spontanous Breathing and Patient connected to nasal cannula oxygen  Post-op Assessment: Report given to RN, Post -op Vital signs reviewed and stable, and Patient moving all extremities X 4  Post vital signs: Reviewed and stable  Last Vitals:  Vitals Value Taken Time  BP 117/75 03/15/24 1225  Temp    Pulse 83 03/15/24 1230  Resp 27 03/15/24 1230  SpO2 100 % 03/15/24 1230  Vitals shown include unfiled device data.  Last Pain:  Vitals:   03/15/24 0728  PainSc: 0-No pain         Complications: No notable events documented.

## 2024-03-15 NOTE — H&P (Signed)
 PREOPERATIVE H&P  Chief Complaint: Bilateral leg pain and weakness  HPI: Emily Graham is a 63 y.o. female who presents with ongoing pain and weakness in the bilateral legs  MRI reveals severe stenosis and instability at L4/5  Patient has failed multiple forms of conservative care and continues to have pain (see office notes for additional details regarding the patient's full course of treatment)  Past Medical History:  Diagnosis Date   ADD (attention deficit disorder) 07/19/2013   Arthritis    Cholecystitis, acute with cholelithiasis 07/19/2013   Chronic left shoulder pain 07/19/2013   Complication of anesthesia    pt aspirated during leg lift surgery.   Dyspnea    GERD (gastroesophageal reflux disease) 07/19/2013   Goiter    HTN (hypertension)    Hypoglycemia    Hypoglycemia 07/19/2013   Murmur    Neuromuscular disorder (HCC)    Pre-diabetes    Seizure disorder, primary generalized (HCC) 07/19/2013   May be related to hypoglycemia   Seizures (HCC) last 01/08/18   2013-grand mal due to hypoglycemia   Sleep apnea    "mild"-not using CPAP per pt   Past Surgical History:  Procedure Laterality Date   ABDOMINAL HYSTERECTOMY     BREAST ENHANCEMENT SURGERY     CHOLECYSTECTOMY N/A 07/20/2013   Procedure: LAPAROSCOPIC CHOLECYSTECTOMY WITH INTRAOPERATIVE CHOLANGIOGRAM;  Surgeon: Kandis Cocking, MD;  Location: [facility];  Service: General;  Laterality: N/A;   GASTRIC BYPASS     GASTRIC BYPASS     GASTRIC BYPASS  2000   JOINT REPLACEMENT Bilateral    knees   leg lift  2008   had aspiration in lungs, fluid was "drained" per pt/this was done at outpatient center/[facility]   Stomach tuck     TOTAL SHOULDER REPLACEMENT Right    Social History   Socioeconomic History   Marital status: Married    Spouse name: Not on file   Number of children: 2   Years of education: Not on file   Highest education level: 12th grade  Occupational History    Employer: UNEMPLOYED  Tobacco Use    Smoking status: Never   Smokeless tobacco: Never  Vaping Use   Vaping status: Never Used  Substance and Sexual Activity   Alcohol use: Yes    Alcohol/week: 3.0 standard drinks of alcohol    Types: 1 Standard drinks or equivalent, 2 Glasses of wine per week    Comment: Occasional/social    Drug use: No   Sexual activity: Yes  Other Topics Concern   Not on file  Social History Narrative   Right handed   Lives with husband   Caffeine - 1 cup daily   Social Drivers of Health   Financial Resource Strain: Low Risk  (02/05/2024)   Overall Financial Resource Strain (CARDIA)    Difficulty of Paying Living Expenses: Not very hard  Food Insecurity: No Food Insecurity (02/05/2024)   Hunger Vital Sign    Worried About Running Out of Food in the Last Year: Never true    Ran Out of Food in the Last Year: Never true  Transportation Needs: No Transportation Needs (02/05/2024)   PRAPARE - Transportation    Lack of Transportation (Medical): No    Lack of Transportation (Non-Medical): No  Physical Activity: Unknown (02/05/2024)   Exercise Vital Sign    Days of Exercise per Week: 0 days    Minutes of Exercise per Session: Not on file  Stress: No Stress Concern Present (  02/05/2024)   [employer] of Occupational Health - Occupational Stress Questionnaire    Feeling of Stress : Only a little  Social Connections: Socially Integrated (02/05/2024)   Social Connection and Isolation Panel [NHANES]    Frequency of Communication with Friends and Family: More than three times a week    Frequency of Social Gatherings with Friends and Family: More than three times a week    Attends Religious Services: More than 4 times per year    Active Member of [employer] or Organizations: Yes    Attends Engineer, structural: More than 4 times per year    Marital Status: Married   Family History  Problem Relation Age of Onset   Stroke Mother    Skin cancer Mother    Hypertension Mother    Hyperlipidemia  Mother    Coronary artery disease Father        MI at age 65   Heart disease Father    Hypertension Father    Diabetes Father    Breast cancer Maternal Aunt    Cancer Maternal Aunt    Breast cancer Paternal Aunt    Breast cancer Paternal Uncle    Cancer Cousin    Colon cancer Neg Hx    Allergies  Allergen Reactions   Latex Rash   Prior to Admission medications   Medication Sig Start Date End Date Taking? Authorizing Provider  acetaminophen (TYLENOL) 650 MG CR tablet Take 1,300 mg by mouth every 8 (eight) hours as needed for pain.   Yes [provider]  b complex vitamins tablet Take 1 tablet by mouth daily.   Yes [provider]  Brimonidine Tartrate (LUMIFY) 0.025 % SOLN Place 1 drop into both eyes daily as needed (redness/irritation).   Yes [provider]  Cyanocobalamin (B-12) 5000 MCG SUBL Place 5,000 mcg under the tongue once a week.   Yes [provider]  gabapentin (NEURONTIN) 600 MG tablet Take  1/2 to 1 tablet  3 x /day  as needed for Pain Patient taking differently: Take 600 mg by mouth at bedtime. 03/01/23  Yes Lucky Cowboy, MD  IRON, FERROUS SULFATE, PO Take 27 mg by mouth daily.   Yes [provider]  levETIRAcetam (KEPPRA) 500 MG tablet Take 1 tablet (500 mg total) by mouth 2 (two) times daily. 09/29/23 09/23/24 Yes Camara, Amalia Hailey, MD  loratadine (CLARITIN) 10 MG tablet Take 10 mg by mouth daily.   Yes [provider]  Midazolam (NAYZILAM) 5 MG/0.1ML SOLN Place 5 mg into the nose as needed (for seizure clusters or seizure lasting more than 2 minutes). 09/29/23  Yes Camara, Amalia Hailey, MD  Milk Thistle 1000 MG CAPS Take 1,000 mg by mouth daily.   Yes [provider]  Multiple Vitamin (MULTIVITAMIN) tablet Take 1 tablet by mouth daily.    Yes [provider]  Multiple Vitamins-Minerals (HAIR SKIN AND NAILS FORMULA PO) Take 1 tablet by mouth daily. 08/02/19  Yes [provider]  psyllium  (REGULOID) 0.52 g capsule Take 0.52 g by mouth daily.   Yes [provider]  TURMERIC PO Take 1,500 mg by mouth 2 (two) times daily.   Yes [provider]  Vitamin D, Ergocalciferol, (DRISDOL) 50000 units CAPS capsule Take 1 capsule (50,000 Units total) by mouth daily. 06/23/18  Yes Lucky Cowboy, MD  albuterol (VENTOLIN HFA) 108 (90 Base) MCG/ACT inhaler Inhale 1-2 puffs into the lungs every 6 (six) hours as needed for wheezing or shortness of breath.  Patient not taking: Reported on 03/02/2024 02/18/22   Lucky Cowboy, MD     All other systems have been reviewed and were otherwise negative with the exception of those mentioned in the HPI and as above.  Physical Exam: Vitals:   03/15/24 0648  BP: 103/68  Pulse: 70  Resp: 18  Temp: 98.1 F (36.7 C)  SpO2: 97%    Body mass index is 26.83 kg/m.  General: Alert, no acute distress Cardiovascular: No pedal edema Respiratory: No cyanosis, no use of accessory musculature Skin: No lesions in the area of chief complaint Neurologic: Sensation intact distally Psychiatric: Patient is competent for consent with normal mood and affect Lymphatic: No axillary or cervical lymphadenopathy   Assessment/Plan: LUMBAR RADICULOPATHY, L4/5 STENOSIS AND INSTABILITY Plan for Procedure(s): ANTERIOR LUMBAR FUSION 1 LEVEL POSTERIOR LUMBAR FUSION 1 LEVEL ABDOMINAL EXPOSURE   Jackelyn Hoehn, MD 03/15/2024 8:01 AM

## 2024-03-15 NOTE — H&P (Signed)
 History and Physical Interval Note:  03/15/2024 8:16 AM  Emily Graham  has presented today for surgery, with the diagnosis of LUMBAR RIDICULOPATHY.  The various methods of treatment have been discussed with the patient and family. After consideration of risks, benefits and other options for treatment, the patient has consented to  Procedure(s) with comments: ANTERIOR LUMBAR FUSION 1 LEVEL (N/A) - ANTERIOR LUMBAR INTERBODY FUSION LUMBAR 4- LUMBAR 5 WITH INSTRUMENTATION AND ALLOGRAFT WITH POSTERIOR SPINAL FUSION WITH INSTRUMENTATION AND ALLOGRAFT POSTERIOR LUMBAR FUSION 1 LEVEL (N/A) ABDOMINAL EXPOSURE (N/A) as a surgical intervention.  The patient's history has been reviewed, patient examined, no change in status, stable for surgery.  I have reviewed the patient's chart and labs.  Questions were answered to the patient's satisfaction.    Anterior spine exposure for L4-L5 ALIF  Cephus Shelling     Patient name: Emily Graham    MRN: [id number]        DOB: 1961-02-23        Sex: female   REASON FOR CONSULT: Abdominal exposure for L4-L5 ALIF   HPI: Emily Graham is a 62 y.o. female, with history of hypertension, prediabetes, and chronic back pain that presents for evaluation of abdominal exposure for L4-L5 ALIF.  Patient is under the care of Dr. Yevette Edwards.  She describes chronic lower back pain since age 56.  This got worse on December 10, 2023 when she had a mechanical fall at home.  She has failed conservative management.  Dr. Yevette Edwards has recommended an anterior lumbar interbody fusion at L4-L5 and asked vascular surgery to assist.  Previous abdominal surgery includes hysterectomy, lap gastric bypass, lap cholecystectomy, abdominoplasty.       Past Medical History:  Diagnosis Date   ADD (attention deficit disorder) 07/19/2013   Arthritis     Cholecystitis, acute with cholelithiasis 07/19/2013   Chronic left shoulder pain 07/19/2013   Dyspnea     GERD (gastroesophageal reflux disease)  07/19/2013   Goiter     HTN (hypertension)     Hypoglycemia     Hypoglycemia 07/19/2013   Murmur     Pre-diabetes     Seizure disorder, primary generalized (HCC) 07/19/2013    May be related to hypoglycemia   Seizures (HCC) last 01/08/18    2013-grand mal due to hypoglycemia   Sleep apnea      "mild"-not using CPAP per pt               Past Surgical History:  Procedure Laterality Date   ABDOMINAL HYSTERECTOMY       BREAST ENHANCEMENT SURGERY       CHOLECYSTECTOMY N/A 07/20/2013    Procedure: LAPAROSCOPIC CHOLECYSTECTOMY WITH INTRAOPERATIVE CHOLANGIOGRAM;  Surgeon: Kandis Cocking, MD;  Location: [facility];  Service: General;  Laterality: N/A;   GASTRIC BYPASS       GASTRIC BYPASS       GASTRIC BYPASS   2000   leg lift   2008    had aspiration in lungs, fluid was "drained" per pt/this was done at outpatient center/[facility]   Stomach tuck       TOTAL SHOULDER REPLACEMENT                   Family History  Problem Relation Age of Onset   Stroke Mother     Skin cancer Mother     Hypertension Mother     Hyperlipidemia Mother     Coronary artery disease Father  MI at age 49   Heart disease Father     Hypertension Father     Diabetes Father     Breast cancer Maternal Aunt     Cancer Maternal Aunt     Breast cancer Paternal Aunt     Breast cancer Paternal Uncle     Cancer Cousin     Colon cancer Neg Hx            SOCIAL HISTORY: Social History         Socioeconomic History   Marital status: Married      Spouse name: Not on file   Number of children: 2   Years of education: Not on file   Highest education level: 12th grade  Occupational History      Employer: UNEMPLOYED  Tobacco Use   Smoking status: Never   Smokeless tobacco: Never  Vaping Use   Vaping status: Never Used  Substance and Sexual Activity   Alcohol use: Yes      Alcohol/week: 3.0 standard drinks of alcohol      Types: 1 Standard drinks or equivalent, 2 Glasses of wine per week      Comment:  Occasional/social    Drug use: No   Sexual activity: Yes  Other Topics Concern   Not on file  Social History Narrative    Right handed    Lives with husband    Caffeine - 1 cup daily    Social Drivers of Health        Financial Resource Strain: Low Risk  (02/05/2024)    Overall Financial Resource Strain (CARDIA)     Difficulty of Paying Living Expenses: Not very hard  Food Insecurity: No Food Insecurity (02/05/2024)    Hunger Vital Sign     Worried About Running Out of Food in the Last Year: Never true     Ran Out of Food in the Last Year: Never true  Transportation Needs: No Transportation Needs (02/05/2024)    PRAPARE - Transportation     Lack of Transportation (Medical): No     Lack of Transportation (Non-Medical): No  Physical Activity: Unknown (02/05/2024)    Exercise Vital Sign     Days of Exercise per Week: 0 days     Minutes of Exercise per Session: Not on file  Stress: No Stress Concern Present (02/05/2024)    [employer] of Occupational Health - Occupational Stress Questionnaire     Feeling of Stress : Only a little  Social Connections: Socially Integrated (02/05/2024)    Social Connection and Isolation Panel [NHANES]     Frequency of Communication with Friends and Family: More than three times a week     Frequency of Social Gatherings with Friends and Family: More than three times a week     Attends Religious Services: More than 4 times per year     Active Member of [employer] or Organizations: Yes     Attends Engineer, structural: More than 4 times per year     Marital Status: Married  Catering manager Violence: Not on file      Allergies      Allergies  Allergen Reactions   Latex Rash              Current Outpatient Medications  Medication Sig Dispense Refill   acetaminophen (TYLENOL) 650 MG CR tablet Take 1,300 mg by mouth every 8 (eight) hours as needed for pain.       albuterol (VENTOLIN HFA)  108 (90 Base) MCG/ACT inhaler Inhale 1-2 puffs into  the lungs every 6 (six) hours as needed for wheezing or shortness of breath. (Patient not taking: Reported on 03/02/2024) 18 g 1   b complex vitamins tablet Take 1 tablet by mouth daily.       Brimonidine Tartrate (LUMIFY) 0.025 % SOLN Place 1 drop into both eyes daily as needed (redness/irritation).       Cyanocobalamin (B-12) 5000 MCG SUBL Place 5,000 mcg under the tongue once a week.       gabapentin (NEURONTIN) 600 MG tablet Take  1/2 to 1 tablet  3 x /day  as needed for Pain (Patient taking differently: Take 600 mg by mouth at bedtime.) 270 tablet 1   IRON, FERROUS SULFATE, PO Take 27 mg by mouth daily.       levETIRAcetam (KEPPRA) 500 MG tablet Take 1 tablet (500 mg total) by mouth 2 (two) times daily. 180 tablet 3   loratadine (CLARITIN) 10 MG tablet Take 10 mg by mouth daily.       Midazolam (NAYZILAM) 5 MG/0.1ML SOLN Place 5 mg into the nose as needed (for seizure clusters or seizure lasting more than 2 minutes). 2 each 0   Milk Thistle 1000 MG CAPS Take 1,000 mg by mouth daily.       Multiple Vitamin (MULTIVITAMIN) tablet Take 1 tablet by mouth daily.        Multiple Vitamins-Minerals (HAIR SKIN AND NAILS FORMULA PO) Take 1 tablet by mouth daily.       psyllium (REGULOID) 0.52 g capsule Take 0.52 g by mouth daily.       TURMERIC PO Take 1,500 mg by mouth 2 (two) times daily.       Vitamin D, Ergocalciferol, (DRISDOL) 50000 units CAPS capsule Take 1 capsule (50,000 Units total) by mouth daily. 90 capsule 1      No current facility-administered medications for this visit.        REVIEW OF SYSTEMS:  [X]  denotes positive finding, [ ]  denotes negative finding Cardiac   Comments:  Chest pain or chest pressure:      Shortness of breath upon exertion:      Short of breath when lying flat:      Irregular heart rhythm:             Vascular      Pain in calf, thigh, or hip brought on by ambulation:      Pain in feet at night that wakes you up from your sleep:       Blood clot in your  veins:      Leg swelling:              Pulmonary      Oxygen at home:      Productive cough:       Wheezing:              Neurologic      Sudden weakness in arms or legs:       Sudden numbness in arms or legs:       Sudden onset of difficulty speaking or slurred speech:      Temporary loss of vision in one eye:       Problems with dizziness:              Gastrointestinal      Blood in stool:       Vomited blood:  Genitourinary      Burning when urinating:       Blood in urine:             Psychiatric      Major depression:              Hematologic      Bleeding problems:      Problems with blood clotting too easily:             Skin      Rashes or ulcers:             Constitutional      Fever or chills:          PHYSICAL EXAM: There were no vitals filed for this visit.   GENERAL: The patient is a well-nourished female, in no acute distress. The vital signs are documented above. CARDIAC: There is a regular rate and rhythm.  VASCULAR:  Bilateral femoral pulses palpable Bilateral AT pulses palpable PULMONARY: No respiratory distress. ABDOMEN: Soft and non-tender.  Laparoscopic incisions well-healed.  Abdominoplasty incision well-healed. MUSCULOSKELETAL: There are no major deformities or cyanosis. NEUROLOGIC: No focal weakness or paresthesias are detected. SKIN: There are no ulcers or rashes noted. PSYCHIATRIC: The patient has a normal affect.   DATA:    MRI Lumbar Reviewed 12/24/23      Assessment/Plan:    63 y.o. female, with history of hypertension, prediabetes, and chronic back pain that presents for evaluation of abdominal exposure for L4-L5 ALIF.  I reviewed her MRI and discussed she would be a good candidate for anterior approach.  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  Discussed paramedian incision over the left rectus muscle and then entering into the retroperitoneum to mobilize  peritoneum and left ureter across midline.  Discussed mobilizing iliac artery and vein.  Discussed risk of injury to the above structures including risk of vessel injury.  All questions answered.  She is scheduled for next week and look forward to assisting Dr. Yevette Edwards.     Cephus Shelling, MD [facility] Office: [phone]

## 2024-03-15 NOTE — OR Nursing (Signed)
 Radiology report, given verbally via phone call by Dr. Roda Shutters, states that there are no foreign body object retained in the abdomen. Results given verbally to [facility], PA-C prior to final closing.

## 2024-03-15 NOTE — Anesthesia Procedure Notes (Addendum)
 Procedure Name: Intubation Date/Time: 03/15/2024 8:45 AM  Performed by: Shary Decamp, CRNAPre-anesthesia Checklist: Patient identified, Patient being monitored, Timeout performed, Emergency Drugs available and Suction available Patient Re-evaluated:Patient Re-evaluated prior to induction Oxygen Delivery Method: Circle System Utilized Preoxygenation: Pre-oxygenation with 100% oxygen Induction Type: IV induction Ventilation: Mask ventilation without difficulty Laryngoscope Size: Miller and 2 Grade View: Grade I Tube type: Oral Tube size: 7.0 mm Number of attempts: 2 Airway Equipment and Method: Stylet Placement Confirmation: ETT inserted through vocal cords under direct vision, positive ETCO2 and breath sounds checked- equal and bilateral Secured at: 21 cm Tube secured with: Tape Dental Injury: Teeth and Oropharynx as per pre-operative assessment  Comments: 1st attempt by SRNA with mac 3, 2nd by CRNA with mill 2 successful! Bile was regurgitating from patient mouth post intubation. 100-125 cc bile products drained. With patient history of prior aspiration, recommended RSI with CP for future GA

## 2024-03-16 ENCOUNTER — Encounter (HOSPITAL_COMMUNITY): Payer: Self-pay | Admitting: Orthopedic Surgery

## 2024-03-16 MED ORDER — OXYCODONE-ACETAMINOPHEN 5-325 MG PO TABS: 1.0000 | ORAL_TABLET | ORAL | 0 refills | Status: DC | PRN

## 2024-03-16 MED ORDER — METHOCARBAMOL 500 MG PO TABS: 500.0000 mg | ORAL_TABLET | Freq: Four times a day (QID) | ORAL | 2 refills | Status: DC | PRN

## 2024-03-16 NOTE — Evaluation (Signed)
 Occupational Therapy Evaluation Patient Details Name: Emily Graham MRN: [id number] DOB: January 14, 1961 Today's Date: 03/16/2024   History of Present Illness   Pt is a 63 y.o. female s/p L4-5 ALIF. PMH: HTN, prediabetes, chronic back pain     Clinical Impressions Patient admitted for above and presents with problem list below.  PTA pt was independent. Patient was educated on brace mgmt and wear schedule, back precautions, ADL compensatory techniques, AE/DME, mobility progression, safety and recommendations.  Today, pt demonstrated ability to complete bed mobility with modified independence, transfers with modified independence, functional mobility with modified independence, and ADLs with modified independence.  Supervision for brace mgmt.  At discharge, pt will have support from spouse at dc.  Based on performance today, no further OT Needs identified.  OT will sign off.        If plan is discharge home, recommend the following:   A little help with bathing/dressing/bathroom;Assistance with cooking/housework;Assist for transportation     Functional Status Assessment   Patient has had a recent decline in their functional status and demonstrates the ability to make significant improvements in function in a reasonable and predictable amount of time.     Equipment Recommendations   None recommended by OT     Recommendations for Other Services         Precautions/Restrictions   Precautions Precautions: Back Precaution Booklet Issued: Yes (comment) Recall of Precautions/Restrictions: Intact Precaution/Restrictions Comments: Educated on 3/3 back precautions. Handout provided. Required Braces or Orthoses: Spinal Brace Spinal Brace: Thoracolumbosacral orthotic;Applied in sitting position Restrictions Weight Bearing Restrictions Per Provider Order: Yes RUE Weight Bearing Per Provider Order: Weight bear through elbow only Other Position/Activity Restrictions: Pt wearing R  wrist brace, reporrts NWB with plan for surgery soon     Mobility Bed Mobility Overal bed mobility: Modified Independent             General bed mobility comments: HOB elevated, increased time    Transfers Overall transfer level: Needs assistance Equipment used: None Transfers: Sit to/from Stand Sit to Stand: Modified independent (Device/Increase time)                  Balance Overall balance assessment: No apparent balance deficits (not formally assessed)                                         ADL either performed or assessed with clinical judgement   ADL Overall ADL's : Modified independent                                       General ADL Comments: cueing for compensatory techniques and supervision for brace but no physical assistance required.  good understanding of back precautions and adherance functionally     Vision Baseline Vision/History: 1 Wears glasses Vision Assessment?: No apparent visual deficits     Perception         Praxis         Pertinent Vitals/Pain Pain Assessment Pain Assessment: Faces Faces Pain Scale: Hurts a little bit Pain Location: back Pain Descriptors / Indicators: Discomfort Pain Intervention(s): Limited activity within patient's tolerance, Monitored during session, Repositioned     Extremity/Trunk Assessment Upper Extremity Assessment Upper Extremity Assessment: Overall WFL for tasks assessed (R hand in wrist brace, hx of SLAC)   Lower  Extremity Assessment Lower Extremity Assessment: Defer to PT evaluation   Cervical / Trunk Assessment Cervical / Trunk Assessment: Back Surgery   Communication Communication Communication: No apparent difficulties   Cognition Arousal: Alert Behavior During Therapy: WFL for tasks assessed/performed Cognition: No apparent impairments                               Following commands: Intact       Cueing  General Comments    Cueing Techniques: Verbal cues      Exercises     Shoulder Instructions      Home Living Family/patient expects to be discharged to:: Private residence Living Arrangements: Spouse/significant other Available Help at Discharge: Family;Available 24 hours/day Type of Home: House Home Access: Stairs to enter [employer] of Steps: 3 Entrance Stairs-Rails: Right Home Layout: One level     Bathroom Shower/Tub: Producer, television/film/video: Standard     Home Equipment: BSC/3in1;Shower seat - built in;Other (comment);Grab bars - tub/shower;Hand held shower head (adjustable bed)   Additional Comments: adjustable frame bed      Prior Functioning/Environment Prior Level of Function : Independent/Modified Independent;Driving                    OT Problem List: Pain   OT Treatment/Interventions:        OT Goals(Current goals can be found in the care plan section)   Acute Rehab OT Goals Patient Stated Goal: home OT Goal Formulation: With patient   OT Frequency:       Co-evaluation              AM-PAC OT "6 Clicks" Daily Activity     Outcome Measure Help from another person eating meals?: None Help from another person taking care of personal grooming?: None Help from another person toileting, which includes using toliet, bedpan, or urinal?: None Help from another person bathing (including washing, rinsing, drying)?: None Help from another person to put on and taking off regular upper body clothing?: A Little Help from another person to put on and taking off regular lower body clothing?: None 6 Click Score: 23   End of Session Equipment Utilized During Treatment: Back brace Nurse Communication: Mobility status  Activity Tolerance: Patient tolerated treatment well Patient left: with call bell/phone within reach;Other (comment) (sitting EOB)  OT Visit Diagnosis: Other abnormalities of gait and mobility (R26.89);Pain Pain - part of body:   (back)                Time: 1610-9604 OT Time Calculation (min): 24 min Charges:  OT General Charges $OT Visit: 1 Visit OT Evaluation $OT Eval Low Complexity: 1 Low  Barry Brunner, OT Acute Rehabilitation Services Office [phone] Secure Chat Preferred    Chancy Milroy 03/16/2024, 9:39 AM

## 2024-03-16 NOTE — Progress Notes (Signed)
    Patient doing well  Has been ambulating Denies leg pain  Physical Exam: Vitals:   03/15/24 2305 03/16/24 0330  BP: (!) 95/59 108/62  Pulse: 64 68  Resp: 18 18  Temp: 98.3 F (36.8 C) 98.8 F (37.1 C)  SpO2: 96% 100%    Dressing in place NVI  POD #1 s/p L4/5 A/P fusion, doing well  - up with PT/OT, encourage ambulation - Percocet for pain, Robaxin for muscle spasms - d/c home today with f/u in 2 weeks

## 2024-03-16 NOTE — Discharge Summary (Signed)
 Patient ID: Emily Graham MRN: [id number] DOB/AGE: 03/08/1961 63 y.o.  Admit date: 03/15/2024 Discharge date: 03/16/2024  Admission Diagnoses:  Principal Problem:   Radiculopathy of lumbar region   Discharge Diagnoses:  Same  Past Medical History:  Diagnosis Date   ADD (attention deficit disorder) 07/19/2013   Arthritis    Cholecystitis, acute with cholelithiasis 07/19/2013   Chronic left shoulder pain 07/19/2013   Complication of anesthesia    pt aspirated during leg lift surgery.   Dyspnea    GERD (gastroesophageal reflux disease) 07/19/2013   Goiter    HTN (hypertension)    Hypoglycemia    Hypoglycemia 07/19/2013   Murmur    Neuromuscular disorder (HCC)    Pre-diabetes    Seizure disorder, primary generalized (HCC) 07/19/2013   May be related to hypoglycemia   Seizures (HCC) last 01/08/18   2013-grand mal due to hypoglycemia   Sleep apnea    "mild"-not using CPAP per pt    Surgeries: Procedure(s): ANTERIOR LUMBAR FUSION 1 LEVEL POSTERIOR LUMBAR FUSION 1 LEVEL ABDOMINAL EXPOSURE on 03/15/2024   Consultants: Treatment Team:  Cephus Shelling, MD  Discharged Condition: Improved  Hospital Course: Emily Graham is an 63 y.o. female who was admitted 03/15/2024 for operative treatment of Radiculopathy of lumbar region. Patient has severe unremitting pain that affects sleep, daily activities, and work/hobbies. After pre-op clearance the patient was taken to the operating room on 03/15/2024 and underwent  Procedure(s): ANTERIOR LUMBAR FUSION 1 LEVEL POSTERIOR LUMBAR FUSION 1 LEVEL ABDOMINAL EXPOSURE.    Patient was given perioperative antibiotics:  Anti-infectives (From admission, onward)    Start     Dose/Rate Route Frequency Ordered Stop   03/15/24 1415  ceFAZolin (ANCEF) IVPB 2g/100 mL premix        2 g 200 mL/hr over 30 Minutes Intravenous Every 8 hours 03/15/24 1403 03/15/24 2211   03/15/24 0645  ceFAZolin (ANCEF) IVPB 2g/100 mL premix        2 g 200  mL/hr over 30 Minutes Intravenous On call to O.R. 03/15/24 4132 03/15/24 0920        Patient was given sequential compression devices, early ambulation to prevent DVT.  Patient benefited maximally from hospital stay and there were no complications.    Recent vital signs: Patient Vitals for the past 24 hrs:  BP Temp Temp src Pulse Resp SpO2  03/16/24 0803 112/72 97.8 F (36.6 C) -- 72 16 100 %  03/16/24 0330 108/62 98.8 F (37.1 C) Oral 68 18 100 %  03/15/24 2305 (!) 95/59 98.3 F (36.8 C) Oral 64 18 96 %  03/15/24 1952 114/64 99.1 F (37.3 C) Oral 75 18 98 %  03/15/24 1450 120/79 98.2 F (36.8 C) Oral 87 18 100 %  03/15/24 1415 106/67 97.7 F (36.5 C) -- 77 14 94 %  03/15/24 1400 106/61 -- -- 75 13 92 %  03/15/24 1345 98/64 -- -- 73 11 92 %  03/15/24 1330 100/63 -- -- 73 12 94 %  03/15/24 1315 100/65 -- -- 72 11 92 %  03/15/24 1300 108/63 -- -- 74 15 95 %  03/15/24 1245 108/68 -- -- 78 11 94 %  03/15/24 1230 113/72 -- -- 83 (!) 22 100 %  03/15/24 1226 117/75 97.9 F (36.6 C) -- 86 16 100 %     Discharge Medications:   Allergies as of 03/16/2024       Reactions   Latex Rash        Medication  List     TAKE these medications    acetaminophen 650 MG CR tablet Commonly known as: TYLENOL Take 1,300 mg by mouth every 8 (eight) hours as needed for pain.   albuterol 108 (90 Base) MCG/ACT inhaler Commonly known as: VENTOLIN HFA Inhale 1-2 puffs into the lungs every 6 (six) hours as needed for wheezing or shortness of breath.   b complex vitamins tablet Take 1 tablet by mouth daily.   B-12 5000 MCG Subl Place 5,000 mcg under the tongue once a week.   gabapentin 600 MG tablet Commonly known as: Neurontin Take  1/2 to 1 tablet  3 x /day  as needed for Pain What changed:  how much to take how to take this when to take this additional instructions   HAIR SKIN AND NAILS FORMULA PO Take 1 tablet by mouth daily.   IRON (FERROUS SULFATE) PO Take 27 mg by mouth  daily.   levETIRAcetam 500 MG tablet Commonly known as: Keppra Take 1 tablet (500 mg total) by mouth 2 (two) times daily.   loratadine 10 MG tablet Commonly known as: CLARITIN Take 10 mg by mouth daily.   Lumify 0.025 % Soln Generic drug: Brimonidine Tartrate Place 1 drop into both eyes daily as needed (redness/irritation).   methocarbamol 500 MG tablet Commonly known as: ROBAXIN Take 1 tablet (500 mg total) by mouth every 6 (six) hours as needed for muscle spasms.   Milk Thistle 1000 MG Caps Take 1,000 mg by mouth daily.   multivitamin tablet Take 1 tablet by mouth daily.   Nayzilam 5 MG/0.1ML Soln Generic drug: Midazolam Place 5 mg into the nose as needed (for seizure clusters or seizure lasting more than 2 minutes).   oxyCODONE-acetaminophen 5-325 MG tablet Commonly known as: PERCOCET/ROXICET Take 1-2 tablets by mouth every 4 (four) hours as needed for severe pain (pain score 7-10).   psyllium 0.52 g capsule Commonly known as: REGULOID Take 0.52 g by mouth daily.   Vitamin D (Ergocalciferol) 1.25 MG (50000 UNIT) Caps capsule Commonly known as: DRISDOL Take 1 capsule (50,000 Units total) by mouth daily.        Diagnostic Studies: DG Lumbar Spine 2-3 Views Result Date: 03/15/2024 CLINICAL DATA:  Elective surgery. EXAM: LUMBAR SPINE - 2-3 VIEW COMPARISON:  None Available. FINDINGS: Two fluoroscopic spot views of the lumbar spine submitted from the operating room. Posterior rod with pedicle screw fixation L4-L5 with interbody spacer. Fluoroscopy time 3 minutes 24 seconds. Dose 142.23 mGy. IMPRESSION: Intraoperative fluoroscopy during L4-L5 fusion. Electronically Signed   By: Narda Rutherford M.D.   On: 03/15/2024 16:04   DG C-Arm 1-60 Min-No Report Result Date: 03/15/2024 Fluoroscopy was utilized by the requesting physician.  No radiographic interpretation.   DG C-Arm 1-60 Min-No Report Result Date: 03/15/2024 Fluoroscopy was utilized by the requesting physician.  No  radiographic interpretation.   DG C-Arm 1-60 Min-No Report Result Date: 03/15/2024 Fluoroscopy was utilized by the requesting physician.  No radiographic interpretation.   DG C-Arm 1-60 Min-No Report Result Date: 03/15/2024 Fluoroscopy was utilized by the requesting physician.  No radiographic interpretation.   DG OR LOCAL ABDOMEN Result Date: 03/15/2024 CLINICAL DATA:  Status post L4-5 lumbar spinal fixation EXAM: OR LOCAL ABDOMEN COMPARISON:  MRI lumbar spine dated 12/24/2023, CT abdomen and pelvis dated 11/20/2014 FINDINGS: Single frontal view of the abdomen spanning T11-mid acetabulum. Partially imaged enteric tube projects over the left upper quadrant. Surgical sutures are present in the left upper quadrant and surgical staples within  the right upper quadrant. Postsurgical changes of L4-5 spinal fixation with 4 surgical clips projecting over the L5 vertebral body. No unexpected radiopaque foreign body. Transitional lumbosacral anatomy with hemi sacralization of right L5. Large volume stool within the ascending colon. Mildly dilated gas-filled loops of small bowel within the midline lower abdomen, likely ileus. IMPRESSION: 1. Postsurgical changes of L4-5 spinal fixation. No unexpected radiopaque foreign body. 2. Mildly dilated gas-filled loops of small bowel within the midline lower abdomen, likely ileus. 3. Large volume stool within the ascending colon. These results were called by telephone at the time of interpretation on 03/15/2024 at 10:49 am to Ewing Schlein, RN, who verbally acknowledged these results. Electronically Signed   By: Agustin Cree M.D.   On: 03/15/2024 10:49    Disposition: Discharge disposition: 01-Home or Self Care        POD #1 s/p L4/5 A/P fusion, doing well   - up with PT/OT, encourage ambulation - Percocet for pain, Robaxin for muscle spasms -Scripts for pain sent to pharmacy electronically  -D/C instructions sheet printed and in chart -D/C today  -F/U in office 2  weeks   Signed: Eilene Ghazi Michaelangelo Mittelman 03/16/2024, 10:18 AM

## 2024-03-16 NOTE — Evaluation (Signed)
 Physical Therapy Evaluation Patient Details Name: Emily Graham MRN: [id number] DOB: 10/15/1961 Today's Date: 03/16/2024  History of Present Illness  Pt is a 63 y.o. female s/p L4-5 ALIF. PMH: HTN, prediabetes, chronic back pain  Clinical Impression  PT eval complete. Pt mod I bed mobility. Supervision transfers and amb 500' without AD. Ascend/descend 3 steps with min/HHA. All education complete. Pt's spouse able to provide needed level of assist at home. No further skilled PT intervention indicated. PT signing off.        If plan is discharge home, recommend the following: Assist for transportation;Assistance with cooking/housework;Help with stairs or ramp for entrance   Can travel by private vehicle        Equipment Recommendations None recommended by PT  Recommendations for Other Services       Functional Status Assessment Patient has had a recent decline in their functional status and demonstrates the ability to make significant improvements in function in a reasonable and predictable amount of time.     Precautions / Restrictions Precautions Precautions: Back Recall of Precautions/Restrictions: Intact Precaution/Restrictions Comments: Educated on 3/3 back precautions. Handout provided. Required Braces or Orthoses: Spinal Brace Spinal Brace: Thoracolumbosacral orthotic;Applied in standing position Restrictions Other Position/Activity Restrictions: Pt wearing R wrist brace.      Mobility  Bed Mobility Overal bed mobility: Modified Independent             General bed mobility comments: HOB elevated, increased time    Transfers Overall transfer level: Needs assistance Equipment used: None Transfers: Sit to/from Stand Sit to Stand: Supervision                Ambulation/Gait Ambulation/Gait assistance: Supervision Gait Distance (Feet): 500 Feet Assistive device: None Gait Pattern/deviations: Step-through pattern, Decreased stride length Gait velocity:  decreased Gait velocity interpretation: 1.31 - 2.62 ft/sec, indicative of limited community ambulator   General Gait Details: steady gait without AD  Stairs Stairs: Yes Stairs assistance: Min assist Stair Management: No rails, Forwards, Step to pattern Number of Stairs: 3 General stair comments: HHA on L  Wheelchair Mobility     Tilt Bed    Modified Rankin (Stroke Patients Only)       Balance                                             Pertinent Vitals/Pain Pain Assessment Pain Assessment: Faces Faces Pain Scale: Hurts a little bit Pain Location: back Pain Descriptors / Indicators: Discomfort Pain Intervention(s): Premedicated before session, Monitored during session    Home Living Family/patient expects to be discharged to:: Private residence Living Arrangements: Spouse/significant other Available Help at Discharge: Family;Available 24 hours/Graham Type of Home: House Home Access: Stairs to enter Entrance Stairs-Rails: Right Entrance Stairs-Number of Steps: 3   Home Layout: One level Home Equipment: BSC/3in1;Shower seat - built in;Other (comment) Additional Comments: adjustable frame bed    Prior Function Prior Level of Function : Independent/Modified Independent;Driving                     Extremity/Trunk Assessment   Upper Extremity Assessment Upper Extremity Assessment: Defer to OT evaluation    Lower Extremity Assessment Lower Extremity Assessment: Overall WFL for tasks assessed    Cervical / Trunk Assessment Cervical / Trunk Assessment: Back Surgery  Communication   Communication Communication: No apparent difficulties    Cognition Arousal:  Alert Behavior During Therapy: WFL for tasks assessed/performed   PT - Cognitive impairments: No apparent impairments                         Following commands: Intact       Cueing       General Comments      Exercises     Assessment/Plan    PT  Assessment Patient does not need any further PT services  PT Problem List         PT Treatment Interventions      PT Goals (Current goals can be found in the Care Plan section)  Acute Rehab PT Goals Patient Stated Goal: home today PT Goal Formulation: All assessment and education complete, DC therapy    Frequency       Co-evaluation               AM-PAC PT "6 Clicks" Mobility  Outcome Measure Help needed turning from your back to your side while in a flat bed without using bedrails?: None Help needed moving from lying on your back to sitting on the side of a flat bed without using bedrails?: None Help needed moving to and from a bed to a chair (including a wheelchair)?: A Little Help needed standing up from a chair using your arms (e.g., wheelchair or bedside chair)?: A Little Help needed to walk in hospital room?: A Little Help needed climbing 3-5 steps with a railing? : A Little 6 Click Score: 20    End of Session Equipment Utilized During Treatment: Gait belt;Back brace Activity Tolerance: Patient tolerated treatment well Patient left: in bed;with family/visitor present;with call bell/phone within reach Nurse Communication: Mobility status PT Visit Diagnosis: Other abnormalities of gait and mobility (R26.89)    Time: 6578-4696 PT Time Calculation (min) (ACUTE ONLY): 24 min   Charges:   PT Evaluation $PT Eval Moderate Complexity: 1 Mod   PT General Charges $$ ACUTE PT VISIT: 1 Visit         Ferd Glassing., PT  Office # [phone]   Ilda Foil 03/16/2024, 8:34 AM

## 2024-03-16 NOTE — Progress Notes (Addendum)
  Progress Note    03/16/2024 8:15 AM 1 Day Post-Op  Subjective: Denies any pain.  She has walked the halls several times without issue    Vitals:   03/16/24 0330 03/16/24 0803  BP: 108/62 112/72  Pulse: 68 72  Resp: 18 16  Temp: 98.8 F (37.1 C) 97.8 F (36.6 C)  SpO2: 100% 100%    Physical Exam: General: Resting comfortably Lungs: Nonlabored Incisions: Abdominal incision dressed and dry Extremities: Palpable DP pulses bilaterally   CBC    Component Value Date/Time   WBC 7.1 03/06/2024 1400   RBC 4.81 03/06/2024 1400   HGB 15.3 (H) 03/06/2024 1400   HCT 46.6 (H) 03/06/2024 1400   PLT 233 03/06/2024 1400   MCV 96.9 03/06/2024 1400   MCH 31.8 03/06/2024 1400   MCHC 32.8 03/06/2024 1400   RDW 12.2 03/06/2024 1400   LYMPHSABS 1.4 02/08/2024 1405   MONOABS 0.6 02/08/2024 1405   EOSABS 0.2 02/08/2024 1405   BASOSABS 0.0 02/08/2024 1405    BMET    Component Value Date/Time   NA 140 03/06/2024 1400   K 4.9 03/06/2024 1400   CL 108 03/06/2024 1400   CO2 25 03/06/2024 1400   GLUCOSE 95 03/06/2024 1400   BUN 23 03/06/2024 1400   CREATININE 0.53 03/06/2024 1400   CREATININE 0.61 03/01/2023 1000   CALCIUM 9.2 03/06/2024 1400   GFRNONAA >60 03/06/2024 1400   GFRNONAA 87 02/11/2021 1500   GFRAA 100 02/11/2021 1500    INR    Component Value Date/Time   INR 1.1 09/17/2021 0937     Intake/Output Summary (Last 24 hours) at 03/16/2024 0815 Last data filed at 03/15/2024 2032 Gross per 24 hour  Intake 1730 ml  Output 500 ml  Net 1230 ml      Assessment/Plan:  63 y.o. female is 1 day postop, s/p L4/5 A/P fusion   -The patient says she is doing great this morning.  She denies any pain.  She has walked the halls several times without issue -No evidence of bleeding from her surgical site. -Bilateral lower extremities warm and well-perfused with palpable DP pulses -Stable for discharge from vascular standpoint, follow up as needed   Loel Dubonnet,  PA-C [facility] [phone] 03/16/2024 8:15 AM   I have seen and evaluated the patient. I agree with the PA note as documented above.  Looks great postop day 1 status post anterior spine exposure for L4-L5 ALIF.  Abdomen is nice and soft.  Vascular is available as needed.  Okay for discharge from our standpoint.  Cephus Shelling, MD [facility] [place] Office: [phone]

## 2024-03-16 NOTE — Anesthesia Postprocedure Evaluation (Addendum)
 Anesthesia Post Note  Patient: Emily Graham  Procedure(s) Performed: ANTERIOR LUMBAR FUSION 1 LEVEL (Spine Lumbar) POSTERIOR LUMBAR FUSION 1 LEVEL (Spine Lumbar) ABDOMINAL EXPOSURE (Abdomen)     Patient location during evaluation: PACU Anesthesia Type: General Level of consciousness: awake and alert Pain management: pain level controlled Vital Signs Assessment: post-procedure vital signs reviewed and stable Respiratory status: spontaneous breathing, nonlabored ventilation and respiratory function stable Cardiovascular status: blood pressure returned to baseline and stable Postop Assessment: no apparent nausea or vomiting Anesthetic complications: no   No notable events documented.  Last Vitals:    Last Pain:                 Collene Schlichter

## 2024-03-20 MED FILL — Heparin Sodium (Porcine) Inj 1000 Unit/ML: INTRAMUSCULAR | Qty: 30 | Status: AC

## 2024-03-20 MED FILL — Sodium Chloride IV Soln 0.9%: INTRAVENOUS | Qty: 3000 | Status: AC

## 2024-04-05 NOTE — Telephone Encounter (Signed)
 Patient has viewed lab results, patient aware to stop taking medication, left detailed message on VM.

## 2024-04-13 DIAGNOSIS — M25531 Pain in right wrist: Secondary | ICD-10-CM | POA: Diagnosis not present

## 2024-04-28 DIAGNOSIS — M545 Low back pain, unspecified: Secondary | ICD-10-CM | POA: Diagnosis not present

## 2024-06-02 ENCOUNTER — Encounter: Payer: Self-pay | Admitting: Family Medicine

## 2024-06-02 ENCOUNTER — Telehealth: Payer: Self-pay | Admitting: Radiology

## 2024-06-02 ENCOUNTER — Ambulatory Visit: Payer: Self-pay | Admitting: Family Medicine

## 2024-06-02 VITALS — BP 112/70 | HR 90 | Temp 98.3°F | Ht 61.0 in | Wt 141.0 lb

## 2024-06-02 DIAGNOSIS — I10 Essential (primary) hypertension: Secondary | ICD-10-CM | POA: Diagnosis not present

## 2024-06-02 DIAGNOSIS — E7849 Other hyperlipidemia: Secondary | ICD-10-CM | POA: Diagnosis not present

## 2024-06-02 DIAGNOSIS — E538 Deficiency of other specified B group vitamins: Secondary | ICD-10-CM

## 2024-06-02 DIAGNOSIS — K7581 Nonalcoholic steatohepatitis (NASH): Secondary | ICD-10-CM | POA: Diagnosis not present

## 2024-06-02 DIAGNOSIS — K219 Gastro-esophageal reflux disease without esophagitis: Secondary | ICD-10-CM

## 2024-06-02 DIAGNOSIS — M549 Dorsalgia, unspecified: Secondary | ICD-10-CM | POA: Diagnosis not present

## 2024-06-02 DIAGNOSIS — K7400 Hepatic fibrosis, unspecified: Secondary | ICD-10-CM

## 2024-06-02 DIAGNOSIS — Z91030 Bee allergy status: Secondary | ICD-10-CM

## 2024-06-02 DIAGNOSIS — E559 Vitamin D deficiency, unspecified: Secondary | ICD-10-CM

## 2024-06-02 DIAGNOSIS — J4520 Mild intermittent asthma, uncomplicated: Secondary | ICD-10-CM

## 2024-06-02 DIAGNOSIS — G40319 Generalized idiopathic epilepsy and epileptic syndromes, intractable, without status epilepticus: Secondary | ICD-10-CM

## 2024-06-02 DIAGNOSIS — J3089 Other allergic rhinitis: Secondary | ICD-10-CM | POA: Insufficient documentation

## 2024-06-02 DIAGNOSIS — G8929 Other chronic pain: Secondary | ICD-10-CM

## 2024-06-02 DIAGNOSIS — R7989 Other specified abnormal findings of blood chemistry: Secondary | ICD-10-CM

## 2024-06-02 DIAGNOSIS — Z1231 Encounter for screening mammogram for malignant neoplasm of breast: Secondary | ICD-10-CM

## 2024-06-02 LAB — COMPREHENSIVE METABOLIC PANEL WITH GFR
ALT: 46 U/L — ABNORMAL HIGH (ref 0–35)
AST: 33 U/L (ref 0–37)
Albumin: 4.2 g/dL (ref 3.5–5.2)
Alkaline Phosphatase: 66 U/L (ref 39–117)
BUN: 20 mg/dL (ref 6–23)
CO2: 28 meq/L (ref 19–32)
Calcium: 9.1 mg/dL (ref 8.4–10.5)
Chloride: 107 meq/L (ref 96–112)
Creatinine, Ser: 0.63 mg/dL (ref 0.40–1.20)
GFR: 94.25 mL/min (ref >60.00)
Glucose, Bld: 76 mg/dL (ref 70–99)
Potassium: 3.5 meq/L (ref 3.5–5.1)
Sodium: 144 meq/L (ref 135–145)
Total Bilirubin: 0.4 mg/dL (ref 0.2–1.2)
Total Protein: 6.8 g/dL (ref 6.0–8.3)

## 2024-06-02 LAB — CBC WITH DIFFERENTIAL/PLATELET
Basophils Absolute: 0 10*3/uL (ref 0.0–0.1)
Basophils Relative: 0.3 % (ref 0.0–3.0)
Eosinophils Absolute: 0.1 10*3/uL (ref 0.0–0.7)
Eosinophils Relative: 2.5 % (ref 0.0–5.0)
HCT: 40.9 % (ref 36.0–46.0)
Hemoglobin: 13.8 g/dL (ref 12.0–15.0)
Lymphocytes Relative: 20.2 % (ref 12.0–46.0)
Lymphs Abs: 1.1 10*3/uL (ref 0.7–4.0)
MCHC: 33.7 g/dL (ref 30.0–36.0)
MCV: 95 fl (ref 78.0–100.0)
Monocytes Absolute: 0.5 10*3/uL (ref 0.1–1.0)
Monocytes Relative: 9.6 % (ref 3.0–12.0)
Neutro Abs: 3.7 10*3/uL (ref 1.4–7.7)
Neutrophils Relative %: 67.4 % (ref 43.0–77.0)
Platelets: 191 10*3/uL (ref 150.0–400.0)
RBC: 4.3 Mil/uL (ref 3.87–5.11)
RDW: 13.7 % (ref 11.5–15.5)
WBC: 5.5 10*3/uL (ref 4.0–10.5)

## 2024-06-02 LAB — LIPID PANEL
Cholesterol: 139 mg/dL (ref 0–200)
HDL: 59.1 mg/dL (ref >39.00)
LDL Cholesterol: 62 mg/dL (ref 0–99)
NonHDL: 79.46
Total CHOL/HDL Ratio: 2
Triglycerides: 87 mg/dL (ref 0.0–149.0)
VLDL: 17.4 mg/dL (ref 0.0–40.0)

## 2024-06-02 LAB — VITAMIN D 25 HYDROXY (VIT D DEFICIENCY, FRACTURES): VITD: >120 ng/mL

## 2024-06-02 LAB — VITAMIN B12: Vitamin B-12: 1340 pg/mL — ABNORMAL HIGH (ref 211–911)

## 2024-06-02 LAB — TSH: TSH: 0.94 u[IU]/mL (ref 0.35–5.50)

## 2024-06-02 LAB — HEMOGLOBIN A1C: Hgb A1c MFr Bld: 5 % (ref 4.6–6.5)

## 2024-06-02 LAB — T4, FREE: Free T4: 0.79 ng/dL (ref 0.60–1.60)

## 2024-06-02 MED ORDER — ALBUTEROL SULFATE HFA 108 (90 BASE) MCG/ACT IN AERS: 1.0000 | INHALATION_SPRAY | Freq: Four times a day (QID) | RESPIRATORY_TRACT | 0 refills | Status: AC | PRN

## 2024-06-02 MED ORDER — GABAPENTIN 600 MG PO TABS: 600.0000 mg | ORAL_TABLET | Freq: Every day | ORAL | 1 refills | Status: DC

## 2024-06-02 MED ORDER — EPINEPHRINE 0.3 MG/0.3ML IJ SOAJ
0.3000 mg | INTRAMUSCULAR | 0 refills | Status: AC | PRN
Start: 2024-06-02 — End: ?

## 2024-06-02 MED ORDER — OMEPRAZOLE 20 MG PO CPDR: 20.0000 mg | DELAYED_RELEASE_CAPSULE | Freq: Every day | ORAL | 2 refills | Status: DC

## 2024-06-02 NOTE — Patient Instructions (Signed)
 Thank you for trusting us  with your health care.  Please go downstairs for labs before you leave.  I will be in touch with your results and with recommendations.  Follow-up in 4 to 6 weeks

## 2024-06-02 NOTE — Progress Notes (Unsigned)
 New Patient Office Visit  Subjective    Patient ID: Emily Graham, female    DOB: 06/23/1961  Age: 63 y.o. MRN: [id number]  CC: No chief complaint on file.   HPI Emily Graham presents to establish care Previous PCP- Dr. Evia  Other providers: Dr. Beuford for back pain  Dr. [facility]  Dr. Gregg - Neurologist  Dr. Robinson- eyes  Dr. Sebastian for wrists. ?CTS Dr. Yvone for her knees, shoulder     States she needs acid medication   Vitamin D  def in past then elevated  Taking 50,000 once weekly   HLD and prediabetes   Seizure hx - takes Keppra   Last seizure in August 2024  Started in 2002   Hx of liver biopsy and states she did not follow up. Does not recall results.  Hx of NASH   Needs a mammogram   Total hyst in past.   Lap gastric bypass in 2000  Married    Outpatient Encounter Medications as of 06/02/2024  Medication Sig   acetaminophen  (TYLENOL ) 650 MG CR tablet Take 1,300 mg by mouth every 8 (eight) hours as needed for pain.   b complex vitamins tablet Take 1 tablet by mouth daily.   Brimonidine  Tartrate (LUMIFY ) 0.025 % SOLN Place 1 drop into both eyes daily as needed (redness/irritation).   Cyanocobalamin  (B-12) 5000 MCG SUBL Place 5,000 mcg under the tongue once a week.   EPINEPHrine  0.3 mg/0.3 mL IJ SOAJ injection Inject 0.3 mg into the muscle as needed for anaphylaxis.   gabapentin  (NEURONTIN ) 600 MG tablet Take 1 tablet (600 mg total) by mouth at bedtime.   IRON , FERROUS SULFATE , PO Take 27 mg by mouth daily.   levETIRAcetam  (KEPPRA ) 500 MG tablet Take 1 tablet (500 mg total) by mouth 2 (two) times daily.   loratadine  (CLARITIN ) 10 MG tablet Take 10 mg by mouth daily.   Midazolam  (NAYZILAM ) 5 MG/0.1ML SOLN Place 5 mg into the nose as needed (for seizure clusters or seizure lasting more than 2 minutes).   Milk Thistle 1000 MG CAPS Take 1,000 mg by mouth daily.   Multiple Vitamin (MULTIVITAMIN) tablet Take 1 tablet by mouth daily.    Multiple  Vitamins-Minerals (HAIR SKIN AND NAILS FORMULA PO) Take 1 tablet by mouth daily.   omeprazole  (PRILOSEC) 20 MG capsule Take 1 capsule (20 mg total) by mouth daily.   psyllium (REGULOID) 0.52 g capsule Take 0.52 g by mouth daily.   [DISCONTINUED] albuterol  (VENTOLIN  HFA) 108 (90 Base) MCG/ACT inhaler Inhale 1-2 puffs into the lungs every 6 (six) hours as needed for wheezing or shortness of breath.   [DISCONTINUED] gabapentin  (NEURONTIN ) 600 MG tablet Take  1/2 to 1 tablet  3 x /day  as needed for Pain (Patient taking differently: Take 600 mg by mouth at bedtime.)   albuterol  (VENTOLIN  HFA) 108 (90 Base) MCG/ACT inhaler Inhale 1-2 puffs into the lungs every 6 (six) hours as needed for wheezing or shortness of breath.   [DISCONTINUED] methocarbamol  (ROBAXIN ) 500 MG tablet Take 1 tablet (500 mg total) by mouth every 6 (six) hours as needed for muscle spasms.   [DISCONTINUED] oxyCODONE -acetaminophen  (PERCOCET/ROXICET) 5-325 MG tablet Take 1-2 tablets by mouth every 4 (four) hours as needed for severe pain (pain score 7-10).   No facility-administered encounter medications on file as of 06/02/2024.    Past Medical History:  Diagnosis Date   ADD (attention deficit disorder) 07/19/2013   Arthritis    Cholecystitis, acute with cholelithiasis 07/19/2013  Chronic left shoulder pain 07/19/2013   Complication of anesthesia    pt aspirated during leg lift surgery.   Dyspnea    GERD (gastroesophageal reflux disease) 07/19/2013   Goiter    HTN (hypertension)    Hypoglycemia    Hypoglycemia 07/19/2013   Murmur    Neuromuscular disorder (HCC)    Pre-diabetes    Seizure disorder, primary generalized (HCC) 07/19/2013   May be related to hypoglycemia   Seizures (HCC) last 01/08/18   2013-grand mal due to hypoglycemia   Sleep apnea    mild-not using CPAP per pt    Past Surgical History:  Procedure Laterality Date   ABDOMINAL EXPOSURE N/A 03/15/2024   Procedure: ABDOMINAL EXPOSURE;  Surgeon: Gretta Lonni PARAS, MD;  Location: [facility] OR;  Service: Vascular;  Laterality: N/A;   ABDOMINAL HYSTERECTOMY     ANTERIOR LUMBAR FUSION N/A 03/15/2024   Procedure: ANTERIOR LUMBAR FUSION 1 LEVEL;  Surgeon: Beuford Anes, MD;  Location: [facility] OR;  Service: Orthopedics;  Laterality: N/A;  ANTERIOR LUMBAR INTERBODY FUSION LUMBAR 4- LUMBAR 5 WITH INSTRUMENTATION AND ALLOGRAFT WITH POSTERIOR SPINAL FUSION WITH INSTRUMENTATION AND ALLOGRAFT   BREAST ENHANCEMENT SURGERY     CHOLECYSTECTOMY N/A 07/20/2013   Procedure: LAPAROSCOPIC CHOLECYSTECTOMY WITH INTRAOPERATIVE CHOLANGIOGRAM;  Surgeon: Alm VEAR Angle, MD;  Location: [facility] ORS;  Service: General;  Laterality: N/A;   GASTRIC BYPASS     GASTRIC BYPASS     GASTRIC BYPASS  2000   JOINT REPLACEMENT Bilateral    knees   leg lift  2008   had aspiration in lungs, fluid was drained per pt/this was done at outpatient center/[facility]   Stomach tuck     TOTAL SHOULDER REPLACEMENT Right     Family History  Problem Relation Age of Onset   Stroke Mother    Skin cancer Mother    Hypertension Mother    Hyperlipidemia Mother    Coronary artery disease Father        MI at age 66   Heart disease Father    Hypertension Father    Diabetes Father    Breast cancer Maternal Aunt    Cancer Maternal Aunt    Breast cancer Paternal Aunt    Breast cancer Paternal Uncle    Cancer Cousin    Colon cancer Neg Hx     Social History   Socioeconomic History   Marital status: Married    Spouse name: Not on file   Number of children: 2   Years of education: Not on file   Highest education level: 12th grade  Occupational History    Employer: UNEMPLOYED  Tobacco Use   Smoking status: Never   Smokeless tobacco: Never  Vaping Use   Vaping status: Never Used  Substance and Sexual Activity   Alcohol use: Yes    Alcohol/week: 3.0 standard drinks of alcohol    Types: 1 Standard drinks or equivalent, 2 Glasses of wine per week    Comment: Occasional/social    Drug use: No    Sexual activity: Yes  Other Topics Concern   Not on file  Social History Narrative   Right handed   Lives with husband   Caffeine - 1 cup daily   Social Drivers of Health   Financial Resource Strain: Low Risk  (05/29/2024)   Overall Financial Resource Strain (CARDIA)    Difficulty of Paying Living Expenses: Not very hard  Food Insecurity: No Food Insecurity (05/29/2024)   Hunger Vital Sign    Worried  About Running Out of Food in the Last Year: Never true    Ran Out of Food in the Last Year: Never true  Transportation Needs: No Transportation Needs (05/29/2024)   PRAPARE - Administrator, Civil Service (Medical): No    Lack of Transportation (Non-Medical): No  Physical Activity: Inactive (05/29/2024)   Exercise Vital Sign    Days of Exercise per Week: 0 days    Minutes of Exercise per Session: Not on file  Stress: No Stress Concern Present (05/29/2024)   [employer] of Occupational Health - Occupational Stress Questionnaire    Feeling of Stress: Only a little  Social Connections: Socially Integrated (05/29/2024)   Social Connection and Isolation Panel    Frequency of Communication with Friends and Family: More than three times a week    Frequency of Social Gatherings with Friends and Family: More than three times a week    Attends Religious Services: More than 4 times per year    Active Member of [employer] or Organizations: Yes    Attends Engineer, structural: More than 4 times per year    Marital Status: Married  Catering manager Violence: Not on file    Review of Systems  Constitutional:  Negative for chills, fever and malaise/fatigue.  Respiratory:  Negative for shortness of breath.   Cardiovascular:  Negative for chest pain, palpitations and leg swelling.  Gastrointestinal:  Negative for abdominal pain, constipation, diarrhea, nausea and vomiting.  Genitourinary:  Negative for dysuria, frequency and urgency.  Musculoskeletal:  Positive for back pain and  joint pain.  Neurological:  Negative for dizziness, focal weakness and headaches.        Objective    BP 112/70 (BP Location: Left Arm, Patient Position: Sitting)   Pulse 90   Temp 98.3 F (36.8 C) (Temporal)   Ht 5' 1 (1.549 m)   Wt 141 lb (64 kg)   SpO2 98%   BMI 26.64 kg/m   Physical Exam Constitutional:      General: She is not in acute distress.    Appearance: She is not ill-appearing.  HENT:     Mouth/Throat:     Mouth: Mucous membranes are moist.     Pharynx: Oropharynx is clear.   Eyes:     Extraocular Movements: Extraocular movements intact.     Conjunctiva/sclera: Conjunctivae normal.    Cardiovascular:     Rate and Rhythm: Normal rate.  Pulmonary:     Effort: Pulmonary effort is normal.   Musculoskeletal:     Cervical back: Normal range of motion and neck supple.   Skin:    General: Skin is warm and dry.   Neurological:     General: No focal deficit present.     Mental Status: She is alert and oriented to person, place, and time.     Cranial Nerves: No cranial nerve deficit.     Motor: No weakness.     Coordination: Coordination normal.     Gait: Gait normal.   Psychiatric:        Mood and Affect: Mood normal.        Behavior: Behavior normal.        Thought Content: Thought content normal.         Assessment & Plan:   Problem List Items Addressed This Visit     Abnormal liver function tests   Relevant Orders   Ambulatory referral to Gastroenterology   Chronic back pain   Relevant Medications  gabapentin  (NEURONTIN ) 600 MG tablet   Environmental and seasonal allergies   GERD (gastroesophageal reflux disease)   Relevant Medications   omeprazole  (PRILOSEC) 20 MG capsule   HTN (hypertension) - Primary   Relevant Medications   EPINEPHrine  0.3 mg/0.3 mL IJ SOAJ injection   Other Relevant Orders   CBC with Differential/Platelet (Completed)   Comprehensive metabolic panel with GFR (Completed)   TSH (Completed)   T4, free  (Completed)   Hyperlipidemia   Relevant Medications   EPINEPHrine  0.3 mg/0.3 mL IJ SOAJ injection   Other Relevant Orders   Lipid panel (Completed)   Liver fibrosis   Relevant Orders   Ambulatory referral to Gastroenterology   NASH (nonalcoholic steatohepatitis)   Relevant Orders   Hemoglobin A1c (Completed)   Ambulatory referral to Gastroenterology   Seizure disorder, primary generalized (HCC)   Relevant Medications   gabapentin  (NEURONTIN ) 600 MG tablet   Vitamin D  deficiency   Relevant Orders   VITAMIN D  25 Hydroxy (Vit-D Deficiency, Fractures) (Completed)   Other Visit Diagnoses       Mild intermittent asthma without complication       Relevant Medications   albuterol  (VENTOLIN  HFA) 108 (90 Base) MCG/ACT inhaler     Bee sting allergy         B12 deficiency       Relevant Orders   Vitamin B12 (Completed)     Screening mammogram for breast cancer       Relevant Orders   MM 3D SCREENING MAMMOGRAM BILATERAL BREAST       She is a pleasant 63 year old female who is new to the practice and here to establish care. She is under the care of several specialist. Restart omeprazole .  Referral back to GI due to being lost to follow-up with history of liver fibrosis. Check vitamin D  level and adjust supplemental dose as appropriate. Refilled EpiPen 's due to history of bee sting allergy Check vitamin B12 level and follow-up Asthma fairly well-controlled.  Refilled albuterol . Refilled gabapentin  for chronic pain. Screening mammogram ordered Check A1c and lipid panel due to history of prediabetes and hyperlipidemia Recommend follow-up here in 4 weeks or sooner pending labs.  Addendum: Vitamin D  level in toxic range.  She will stop all vitamin D  supplements.  Discuss at follow-up  Return in about 4 weeks (around 06/30/2024) for Fasting follow up.   Emily Mackintosh, NP-C

## 2024-06-02 NOTE — Telephone Encounter (Signed)
 Patient made aware, gave a verbal understanding

## 2024-06-02 NOTE — Telephone Encounter (Signed)
 CRITICAL LAB   VITAMIN D  : <120

## 2024-06-09 DIAGNOSIS — G5601 Carpal tunnel syndrome, right upper limb: Secondary | ICD-10-CM | POA: Diagnosis not present

## 2024-06-09 DIAGNOSIS — M545 Low back pain, unspecified: Secondary | ICD-10-CM | POA: Diagnosis not present

## 2024-06-13 DIAGNOSIS — M4326 Fusion of spine, lumbar region: Secondary | ICD-10-CM | POA: Diagnosis not present

## 2024-06-14 ENCOUNTER — Other Ambulatory Visit: Payer: Self-pay | Admitting: Medical Genetics

## 2024-06-14 DIAGNOSIS — G5601 Carpal tunnel syndrome, right upper limb: Secondary | ICD-10-CM | POA: Diagnosis not present

## 2024-06-20 ENCOUNTER — Other Ambulatory Visit
Admission: RE | Admit: 2024-06-20 | Discharge: 2024-06-20 | Disposition: A | Discharge status: Home / Self Care | Payer: Self-pay | Source: Ambulatory Visit | Attending: Medical Genetics | Admitting: Medical Genetics

## 2024-06-20 ENCOUNTER — Other Ambulatory Visit (HOSPITAL_COMMUNITY)

## 2024-06-20 ENCOUNTER — Other Ambulatory Visit: Payer: Self-pay

## 2024-06-20 DIAGNOSIS — M19131 Post-traumatic osteoarthritis, right wrist: Secondary | ICD-10-CM | POA: Diagnosis not present

## 2024-06-20 DIAGNOSIS — G5601 Carpal tunnel syndrome, right upper limb: Secondary | ICD-10-CM | POA: Diagnosis not present

## 2024-06-21 ENCOUNTER — Other Ambulatory Visit

## 2024-06-21 ENCOUNTER — Ambulatory Visit

## 2024-06-22 DIAGNOSIS — M4326 Fusion of spine, lumbar region: Secondary | ICD-10-CM | POA: Diagnosis not present

## 2024-06-23 DIAGNOSIS — M4326 Fusion of spine, lumbar region: Secondary | ICD-10-CM | POA: Diagnosis not present

## 2024-06-28 ENCOUNTER — Ambulatory Visit: Admitting: Family Medicine

## 2024-06-28 DIAGNOSIS — M948X8 Other specified disorders of cartilage, other site: Secondary | ICD-10-CM | POA: Diagnosis not present

## 2024-06-28 DIAGNOSIS — M19031 Primary osteoarthritis, right wrist: Secondary | ICD-10-CM | POA: Diagnosis not present

## 2024-06-28 DIAGNOSIS — G5601 Carpal tunnel syndrome, right upper limb: Secondary | ICD-10-CM | POA: Diagnosis not present

## 2024-06-28 DIAGNOSIS — G8918 Other acute postprocedural pain: Secondary | ICD-10-CM | POA: Diagnosis not present

## 2024-06-30 ENCOUNTER — Ambulatory Visit: Admitting: Family Medicine

## 2024-07-01 LAB — GENECONNECT MOLECULAR SCREEN: Genetic Analysis Overall Interpretation: NEGATIVE

## 2024-07-18 DIAGNOSIS — G5601 Carpal tunnel syndrome, right upper limb: Secondary | ICD-10-CM | POA: Diagnosis not present

## 2024-07-18 DIAGNOSIS — M19131 Post-traumatic osteoarthritis, right wrist: Secondary | ICD-10-CM | POA: Diagnosis not present

## 2024-07-26 ENCOUNTER — Telehealth: Payer: Self-pay

## 2024-07-26 ENCOUNTER — Ambulatory Visit: Admitting: Family Medicine

## 2024-07-26 ENCOUNTER — Ambulatory Visit (INDEPENDENT_AMBULATORY_CARE_PROVIDER_SITE_OTHER): Admitting: Family Medicine

## 2024-07-26 ENCOUNTER — Encounter: Payer: Self-pay | Admitting: Family Medicine

## 2024-07-26 ENCOUNTER — Ambulatory Visit: Payer: Self-pay | Admitting: Family Medicine

## 2024-07-26 VITALS — BP 130/76 | HR 80 | Temp 97.8°F | Ht 61.0 in | Wt 142.0 lb

## 2024-07-26 DIAGNOSIS — K7400 Hepatic fibrosis, unspecified: Secondary | ICD-10-CM | POA: Diagnosis not present

## 2024-07-26 DIAGNOSIS — R7989 Other specified abnormal findings of blood chemistry: Secondary | ICD-10-CM

## 2024-07-26 DIAGNOSIS — Z79899 Other long term (current) drug therapy: Secondary | ICD-10-CM

## 2024-07-26 DIAGNOSIS — K7581 Nonalcoholic steatohepatitis (NASH): Secondary | ICD-10-CM

## 2024-07-26 DIAGNOSIS — K219 Gastro-esophageal reflux disease without esophagitis: Secondary | ICD-10-CM

## 2024-07-26 DIAGNOSIS — I10 Essential (primary) hypertension: Secondary | ICD-10-CM

## 2024-07-26 DIAGNOSIS — E559 Vitamin D deficiency, unspecified: Secondary | ICD-10-CM

## 2024-07-26 LAB — BASIC METABOLIC PANEL WITH GFR
BUN: 18 mg/dL (ref 6–23)
CO2: 30 meq/L (ref 19–32)
Calcium: 9.3 mg/dL (ref 8.4–10.5)
Chloride: 103 meq/L (ref 96–112)
Creatinine, Ser: 0.7 mg/dL (ref 0.40–1.20)
GFR: 91.79 mL/min (ref >60.00)
Glucose, Bld: 98 mg/dL (ref 70–99)
Potassium: 4.1 meq/L (ref 3.5–5.1)
Sodium: 141 meq/L (ref 135–145)

## 2024-07-26 LAB — HEPATIC FUNCTION PANEL
ALT: 44 U/L — ABNORMAL HIGH (ref 0–35)
AST: 41 U/L — ABNORMAL HIGH (ref 0–37)
Albumin: 4.5 g/dL (ref 3.5–5.2)
Alkaline Phosphatase: 90 U/L (ref 39–117)
Bilirubin, Direct: 0.2 mg/dL (ref 0.0–0.3)
Total Bilirubin: 0.6 mg/dL (ref 0.2–1.2)
Total Protein: 7.4 g/dL (ref 6.0–8.3)

## 2024-07-26 LAB — FERRITIN: Ferritin: 55.9 ng/mL (ref 10.0–291.0)

## 2024-07-26 LAB — VITAMIN D 25 HYDROXY (VIT D DEFICIENCY, FRACTURES): VITD: 113.78 ng/mL (ref 30.00–100.00)

## 2024-07-26 MED ORDER — OMEPRAZOLE 20 MG PO CPDR: 20.0000 mg | DELAYED_RELEASE_CAPSULE | Freq: Every day | ORAL | 2 refills | Status: AC

## 2024-07-26 NOTE — Telephone Encounter (Signed)
 CRITICAL VALUE STICKER  CRITICAL VALUE: Vitamin D  113.78  RECEIVER (on-site recipient of call): Cleatrice Pan, CMA  DATE & TIME NOTIFIED: 07/26/2024 4:51 pm  MESSENGER (representative from lab): Harlene MD NOTIFIED: Boby Mackintosh NP-C  TIME OF NOTIFICATION: 4:53  RESPONSE:  Call patient and inform her that numbers are improving and to continue to avoid all vitamin D  supplements

## 2024-07-26 NOTE — Patient Instructions (Addendum)
 Please go downstairs for labs before you leave.   Please call and schedule [facility] Phone: [phone]  This is for liver disease and severe acid reflux

## 2024-07-26 NOTE — Progress Notes (Signed)
 Subjective:     Patient ID: Emily Graham, female    DOB: March 19, 1961, 62 y.o.   MRN: [id number]  Chief Complaint  Patient presents with   Medical Management of Chronic Issues    4 week f/u    HPI  Discussed the use of AI scribe software for clinical note transcription with the patient, who gave verbal consent to proceed.  History of Present Illness Emily Graham is a 63 year old female with liver fibrosis and elevated vitamin D  levels who presents for follow-up on chronic health conditions and recent abnormal labs.  Hepatic fibrosis and elevated liver enzymes - Diagnosed with minimal hepatic fibrosis and inflammatory changes in 2016 - Liver enzymes were elevated following wrist injections but have since decreased - Referral to gastroenterology for elevated liver enzymes has not been scheduled  Hypervitaminosis d - Vitamin D  level greater than 120 on most recent laboratory evaluation - Discontinued vitamin D  supplementation six months ago - Previously took 50,000 units daily due to history of gastric bypass surgery 25 years ago, which affects vitamin absorption - Repeat vitamin D  level pending today - No current vitamin D  supplementation  Gastroesophageal reflux disease (gerd) - Severe acid reflux requiring daily medication - Takes medication every night with significant improvement in symptoms - Missed doses result in severe symptoms, including awakening with 'yellow bile' in the throat  Hematologic and renal findings - History of anemia, currently not anemic - Kidney function and calcium levels are normal despite elevated vitamin D  levels - Occasional hemorrhoidal bleeding -taking iron  supplement   Other providers: Dr. Beuford for back pain  Dr. [facility]  Dr. Gregg - Neurologist  Dr. Robinson- eyes  Dr. Sebastian for wrists. ?CTS Dr. Yvone for her knees, shoulder       Health Maintenance Due  Topic Date Due   Zoster Vaccines- Shingrix (1 of 2) Never  done   Pneumococcal Vaccine: 19-49 Years (2 of 2 - PCV) 12/15/1999   Pneumococcal Vaccine: 50+ Years (2 of 2 - PCV) 12/15/1999   MAMMOGRAM  01/22/2023   INFLUENZA VACCINE  07/14/2024    Past Medical History:  Diagnosis Date   ADD (attention deficit disorder) 07/19/2013   Arthritis    Cholecystitis, acute with cholelithiasis 07/19/2013   Chronic left shoulder pain 07/19/2013   Complication of anesthesia    pt aspirated during leg lift surgery.   Dyspnea    GERD (gastroesophageal reflux disease) 07/19/2013   Goiter    HTN (hypertension)    Hypoglycemia    Hypoglycemia 07/19/2013   Murmur    Neuromuscular disorder (HCC)    Pre-diabetes    Seizure disorder, primary generalized (HCC) 07/19/2013   May be related to hypoglycemia   Seizures (HCC) last 01/08/18   2013-grand mal due to hypoglycemia   Sleep apnea    mild-not using CPAP per pt    Past Surgical History:  Procedure Laterality Date   ABDOMINAL EXPOSURE N/A 03/15/2024   Procedure: ABDOMINAL EXPOSURE;  Surgeon: Gretta Lonni PARAS, MD;  Location: [facility] OR;  Service: Vascular;  Laterality: N/A;   ABDOMINAL HYSTERECTOMY     ANTERIOR LUMBAR FUSION N/A 03/15/2024   Procedure: ANTERIOR LUMBAR FUSION 1 LEVEL;  Surgeon: Beuford Anes, MD;  Location: [facility] OR;  Service: Orthopedics;  Laterality: N/A;  ANTERIOR LUMBAR INTERBODY FUSION LUMBAR 4- LUMBAR 5 WITH INSTRUMENTATION AND ALLOGRAFT WITH POSTERIOR SPINAL FUSION WITH INSTRUMENTATION AND ALLOGRAFT   BREAST ENHANCEMENT SURGERY     CHOLECYSTECTOMY N/A 07/20/2013   Procedure: LAPAROSCOPIC  CHOLECYSTECTOMY WITH INTRAOPERATIVE CHOLANGIOGRAM;  Surgeon: Alm VEAR Angle, MD;  Location: [facility] ORS;  Service: General;  Laterality: N/A;   GASTRIC BYPASS     GASTRIC BYPASS     GASTRIC BYPASS  2000   JOINT REPLACEMENT Bilateral    knees   leg lift  2008   had aspiration in lungs, fluid was drained per pt/this was done at outpatient center/[facility]   Stomach tuck     TOTAL SHOULDER REPLACEMENT Right      Family History  Problem Relation Age of Onset   Stroke Mother    Skin cancer Mother    Hypertension Mother    Hyperlipidemia Mother    Coronary artery disease Father        MI at age 36   Heart disease Father    Hypertension Father    Diabetes Father    Breast cancer Maternal Aunt    Cancer Maternal Aunt    Breast cancer Paternal Aunt    Breast cancer Paternal Uncle    Cancer Cousin    Colon cancer Neg Hx     Social History   Socioeconomic History   Marital status: Married    Spouse name: Not on file   Number of children: 2   Years of education: Not on file   Highest education level: 12th grade  Occupational History    Employer: UNEMPLOYED  Tobacco Use   Smoking status: Never   Smokeless tobacco: Never  Vaping Use   Vaping status: Never Used  Substance and Sexual Activity   Alcohol use: Yes    Alcohol/week: 3.0 standard drinks of alcohol    Types: 1 Standard drinks or equivalent, 2 Glasses of wine per week    Comment: Occasional/social    Drug use: No   Sexual activity: Yes  Other Topics Concern   Not on file  Social History Narrative   Right handed   Lives with husband   Caffeine - 1 cup daily   Social Drivers of Health   Financial Resource Strain: Low Risk  (05/29/2024)   Overall Financial Resource Strain (CARDIA)    Difficulty of Paying Living Expenses: Not very hard  Food Insecurity: No Food Insecurity (05/29/2024)   Hunger Vital Sign    Worried About Running Out of Food in the Last Year: Never true    Ran Out of Food in the Last Year: Never true  Transportation Needs: No Transportation Needs (05/29/2024)   PRAPARE - Administrator, Civil Service (Medical): No    Lack of Transportation (Non-Medical): No  Physical Activity: Inactive (05/29/2024)   Exercise Vital Sign    Days of Exercise per Week: 0 days    Minutes of Exercise per Session: Not on file  Stress: No Stress Concern Present (05/29/2024)   [employer] of Occupational  Health - Occupational Stress Questionnaire    Feeling of Stress: Only a little  Social Connections: Socially Integrated (05/29/2024)   Social Connection and Isolation Panel    Frequency of Communication with Friends and Family: More than three times a week    Frequency of Social Gatherings with Friends and Family: More than three times a week    Attends Religious Services: More than 4 times per year    Active Member of [employer] or Organizations: Yes    Attends Engineer, structural: More than 4 times per year    Marital Status: Married  Catering manager Violence: Not on file    Outpatient Medications Prior  to Visit  Medication Sig Dispense Refill   acetaminophen  (TYLENOL ) 650 MG CR tablet Take 1,300 mg by mouth every 8 (eight) hours as needed for pain.     albuterol  (VENTOLIN  HFA) 108 (90 Base) MCG/ACT inhaler Inhale 1-2 puffs into the lungs every 6 (six) hours as needed for wheezing or shortness of breath. 18 g 0   b complex vitamins tablet Take 1 tablet by mouth daily.     Brimonidine  Tartrate (LUMIFY ) 0.025 % SOLN Place 1 drop into both eyes daily as needed (redness/irritation).     Cyanocobalamin  (B-12) 5000 MCG SUBL Place 5,000 mcg under the tongue once a week.     EPINEPHrine  0.3 mg/0.3 mL IJ SOAJ injection Inject 0.3 mg into the muscle as needed for anaphylaxis. 1 each 0   gabapentin  (NEURONTIN ) 600 MG tablet Take 1 tablet (600 mg total) by mouth at bedtime. 90 tablet 1   IRON , FERROUS SULFATE , PO Take 27 mg by mouth daily.     levETIRAcetam  (KEPPRA ) 500 MG tablet Take 1 tablet (500 mg total) by mouth 2 (two) times daily. 180 tablet 3   loratadine  (CLARITIN ) 10 MG tablet Take 10 mg by mouth daily.     Milk Thistle 1000 MG CAPS Take 1,000 mg by mouth daily.     Multiple Vitamin (MULTIVITAMIN) tablet Take 1 tablet by mouth daily.      Multiple Vitamins-Minerals (HAIR SKIN AND NAILS FORMULA PO) Take 1 tablet by mouth daily.     psyllium (REGULOID) 0.52 g capsule Take 0.52 g by  mouth daily.     omeprazole  (PRILOSEC) 20 MG capsule Take 1 capsule (20 mg total) by mouth daily. 30 capsule 2   Midazolam  (NAYZILAM ) 5 MG/0.1ML SOLN Place 5 mg into the nose as needed (for seizure clusters or seizure lasting more than 2 minutes). (Patient not taking: Reported on 07/26/2024) 2 each 0   No facility-administered medications prior to visit.    Allergies  Allergen Reactions   Latex Rash    Review of Systems  Constitutional:  Negative for chills and fever.  Respiratory:  Negative for shortness of breath.   Cardiovascular:  Negative for chest pain, palpitations and leg swelling.  Gastrointestinal:  Negative for abdominal pain, constipation, diarrhea, nausea and vomiting.  Genitourinary:  Negative for dysuria, frequency and urgency.  Neurological:  Negative for dizziness and focal weakness.       Objective:    Physical Exam Constitutional:      General: She is not in acute distress.    Appearance: She is not ill-appearing.  Eyes:     Extraocular Movements: Extraocular movements intact.     Conjunctiva/sclera: Conjunctivae normal.  Cardiovascular:     Rate and Rhythm: Normal rate.  Pulmonary:     Effort: Pulmonary effort is normal.  Musculoskeletal:     Cervical back: Normal range of motion and neck supple.  Skin:    General: Skin is warm and dry.  Neurological:     General: No focal deficit present.     Mental Status: She is alert and oriented to person, place, and time.  Psychiatric:        Mood and Affect: Mood normal.        Behavior: Behavior normal.        Thought Content: Thought content normal.      BP 130/76   Pulse 80   Temp 97.8 F (36.6 C) (Temporal)   Ht 5' 1 (1.549 m)   Wt 142 lb (64.4  kg)   SpO2 98%   BMI 26.83 kg/m  Wt Readings from Last 3 Encounters:  07/26/24 142 lb (64.4 kg)  06/02/24 141 lb (64 kg)  03/15/24 142 lb (64.4 kg)       Assessment & Plan:   Problem List Items Addressed This Visit     Abnormal liver function  tests   Relevant Orders   Hepatic function panel   GERD (gastroesophageal reflux disease)   Relevant Medications   omeprazole  (PRILOSEC) 20 MG capsule   RESOLVED: HTN (hypertension)   Relevant Orders   Basic metabolic panel with GFR   Liver fibrosis - Primary   Relevant Orders   Hepatic function panel   NASH (nonalcoholic steatohepatitis)   Relevant Orders   Hepatic function panel   Vitamin D  deficiency   Relevant Orders   VITAMIN D  25 Hydroxy (Vit-D Deficiency, Fractures)   Other Visit Diagnoses       High serum vitamin D        Relevant Orders   Basic metabolic panel with GFR     Medication management       Relevant Orders   VITAMIN D  25 Hydroxy (Vit-D Deficiency, Fractures)   Ferritin      Assessment and Plan Assessment & Plan Liver fibrosis and nonalcoholic steatohepatitis (NASH) with abnormal liver function tests Liver enzymes are mildly elevated. Normal liver enzymes do not exclude liver disease progression, necessitating further evaluation to assess the current status of liver fibrosis and NASH. - Refer to [facility] for further evaluation of liver fibrosis and NASH. - Order liver function tests today. - Emphasize the importance of follow-up with GI to assess liver health and potential need for imaging or further testing.  Vitamin D  toxicity Vitamin D  levels are significantly elevated (>120 ng/mL). She has discontinued vitamin D  supplements. Previous labs showed normal kidney function and calcium levels. Monitoring is required to ensure levels decrease to a safe range. - Recheck vitamin D  level today. - Continue to avoid vitamin D  supplementation until levels normalize.  Gastroesophageal reflux disease (GERD) Chronic GERD is managed with daily omeprazole , resulting in significant symptom improvement. Severe symptoms occur if a dose is missed. Daily omeprazole  use may interfere with mineral absorption, necessitating discussion with a GI specialist. Potential need for  endoscopy due to symptom severity and risk of complications such as erosive esophagitis or Barrett's esophagus. - Refill omeprazole  with three months supply. - Discuss GERD management and potential need for endoscopy with GI specialist. - Advise her to inform GI specialist about the need for daily omeprazole  and severe symptoms when not taken.    I am having Channing Richards maintain her multivitamin, Milk Thistle, b complex vitamins, (IRON , FERROUS SULFATE , PO), B-12, Multiple Vitamins-Minerals (HAIR SKIN AND NAILS FORMULA PO), levETIRAcetam , Nayzilam , Lumify , acetaminophen , loratadine , psyllium, albuterol , EPINEPHrine , gabapentin , and omeprazole .  Meds ordered this encounter  Medications   omeprazole  (PRILOSEC) 20 MG capsule    Sig: Take 1 capsule (20 mg total) by mouth daily.    Dispense:  30 capsule    Refill:  2    Supervising Provider:   ROLLENE NORRIS A [[id number]]

## 2024-07-26 NOTE — Telephone Encounter (Signed)
 Called and spoke with patient. Informed her of recommendation from PCP and she expressed understanding

## 2024-07-27 ENCOUNTER — Encounter: Payer: Self-pay | Admitting: Physician Assistant

## 2024-08-01 ENCOUNTER — Ambulatory Visit: Admitting: Family Medicine

## 2024-08-07 DIAGNOSIS — M4326 Fusion of spine, lumbar region: Secondary | ICD-10-CM | POA: Diagnosis not present

## 2024-08-09 ENCOUNTER — Other Ambulatory Visit: Payer: Self-pay | Admitting: Neurology

## 2024-08-10 ENCOUNTER — Ambulatory Visit
Admission: RE | Admit: 2024-08-10 | Discharge: 2024-08-10 | Disposition: A | Discharge status: Home / Self Care | Source: Ambulatory Visit | Attending: Family Medicine | Admitting: Family Medicine

## 2024-08-10 DIAGNOSIS — Z1231 Encounter for screening mammogram for malignant neoplasm of breast: Secondary | ICD-10-CM

## 2024-08-11 DIAGNOSIS — R569 Unspecified convulsions: Secondary | ICD-10-CM | POA: Diagnosis not present

## 2024-08-11 DIAGNOSIS — R0681 Apnea, not elsewhere classified: Secondary | ICD-10-CM | POA: Diagnosis not present

## 2024-08-11 DIAGNOSIS — M4326 Fusion of spine, lumbar region: Secondary | ICD-10-CM | POA: Diagnosis not present

## 2024-08-11 DIAGNOSIS — R404 Transient alteration of awareness: Secondary | ICD-10-CM | POA: Diagnosis not present

## 2024-08-12 ENCOUNTER — Emergency Department (HOSPITAL_COMMUNITY)
Admission: EM | Admit: 2024-08-12 | Discharge: 2024-08-12 | Disposition: A | Discharge status: Home / Self Care | Attending: Emergency Medicine | Admitting: Emergency Medicine

## 2024-08-12 ENCOUNTER — Other Ambulatory Visit: Payer: Self-pay

## 2024-08-12 ENCOUNTER — Encounter (HOSPITAL_COMMUNITY): Payer: Self-pay | Admitting: Emergency Medicine

## 2024-08-12 DIAGNOSIS — I10 Essential (primary) hypertension: Secondary | ICD-10-CM | POA: Diagnosis not present

## 2024-08-12 DIAGNOSIS — Z91040 Latex allergy status: Secondary | ICD-10-CM | POA: Diagnosis not present

## 2024-08-12 DIAGNOSIS — E876 Hypokalemia: Secondary | ICD-10-CM | POA: Insufficient documentation

## 2024-08-12 DIAGNOSIS — R569 Unspecified convulsions: Secondary | ICD-10-CM | POA: Insufficient documentation

## 2024-08-12 LAB — BASIC METABOLIC PANEL WITH GFR
Anion gap: 9 (ref 5–15)
BUN: 10 mg/dL (ref 8–23)
CO2: 23 mmol/L (ref 22–32)
Calcium: 8.9 mg/dL (ref 8.9–10.3)
Chloride: 113 mmol/L — ABNORMAL HIGH (ref 98–111)
Creatinine, Ser: 0.49 mg/dL (ref 0.44–1.00)
GFR, Estimated: >60 mL/min (ref >60)
Glucose, Bld: 84 mg/dL (ref 70–99)
Potassium: 3.4 mmol/L — ABNORMAL LOW (ref 3.5–5.1)
Sodium: 145 mmol/L (ref 135–145)

## 2024-08-12 LAB — CBC
HCT: 41.9 % (ref 36.0–46.0)
Hemoglobin: 13.9 g/dL (ref 12.0–15.0)
MCH: 32.9 pg (ref 26.0–34.0)
MCHC: 33.2 g/dL (ref 30.0–36.0)
MCV: 99.1 fL (ref 80.0–100.0)
Platelets: 203 K/uL (ref 150–400)
RBC: 4.23 MIL/uL (ref 3.87–5.11)
RDW: 12 % (ref 11.5–15.5)
WBC: 5 K/uL (ref 4.0–10.5)
nRBC: 0 % (ref 0.0–0.2)

## 2024-08-12 LAB — CBG MONITORING, ED: Glucose-Capillary: 76 mg/dL (ref 70–99)

## 2024-08-12 MED ORDER — SODIUM CHLORIDE 0.9 % IV BOLUS
1000.0000 mL | Freq: Once | INTRAVENOUS | Status: AC
  Administered 2024-08-12: 1000 mL via INTRAVENOUS

## 2024-08-12 MED ORDER — POTASSIUM CHLORIDE CRYS ER 20 MEQ PO TBCR
40.0000 meq | EXTENDED_RELEASE_TABLET | Freq: Once | ORAL | Status: AC
  Administered 2024-08-12: 40 meq via ORAL
  Filled 2024-08-12: qty 2

## 2024-08-12 NOTE — ED Provider Notes (Signed)
 [facility]PART[facility] AT [facility] Provider Note   [name]: [id number] Arrival date & time: 08/12/24  0020     Patient presents with: Seizures   Emily Graham is a 63 y.o. female.   The history is provided by the spouse and the EMS personnel. The history is limited by the condition of the patient (Altered mental status).  Seizures  She has history of hypertension, seizure disorder and was brought in by ambulance because of having had a seizure at home.  Husband describes some generalized twitching.  There was no bowel or bladder incontinence and no bit lip or tongue.  He states that she stopped breathing and he had to initiate CPR.  EMS reports patient was unresponsive but with a pulse.  And route, she had a second seizure which was treated with midazolam .  Husband relates that she has not missed any doses of her anticonvulsant medications.    Prior to Admission medications   Medication Sig Start Date End Date Taking? Authorizing Provider  acetaminophen  (TYLENOL ) 650 MG CR tablet Take 1,300 mg by mouth every 8 (eight) hours as needed for pain.    [provider]  albuterol  (VENTOLIN  HFA) 108 (90 Base) MCG/ACT inhaler Inhale 1-2 puffs into the lungs every 6 (six) hours as needed for wheezing or shortness of breath. 06/02/24   Henson, Vickie L, NP-C  b complex vitamins tablet Take 1 tablet by mouth daily.    [provider]  Brimonidine  Tartrate (LUMIFY ) 0.025 % SOLN Place 1 drop into both eyes daily as needed (redness/irritation).    [provider]  Cyanocobalamin  (B-12) 5000 MCG SUBL Place 5,000 mcg under the tongue once a week.    [provider]  EPINEPHrine  0.3 mg/0.3 mL IJ SOAJ injection Inject 0.3 mg into the muscle as needed for anaphylaxis. 06/02/24   Henson, Vickie L, NP-C  gabapentin  (NEURONTIN ) 600 MG tablet Take 1 tablet (600 mg total) by mouth at bedtime. 06/02/24   Henson, Vickie L, NP-C  IRON , FERROUS SULFATE , PO Take 27  mg by mouth daily.    [provider]  levETIRAcetam  (KEPPRA ) 500 MG tablet TAKE 1 TABLET BY MOUTH TWICE DAILY 08/09/24   Gregg Lek, MD  loratadine  (CLARITIN ) 10 MG tablet Take 10 mg by mouth daily.    [provider]  Midazolam  (NAYZILAM ) 5 MG/0.1ML SOLN Place 5 mg into the nose as needed (for seizure clusters or seizure lasting more than 2 minutes). Patient not taking: Reported on 07/26/2024 09/29/23   Camara, Amadou, MD  Milk Thistle 1000 MG CAPS Take 1,000 mg by mouth daily.    [provider]  Multiple Vitamin (MULTIVITAMIN) tablet Take 1 tablet by mouth daily.     [provider]  Multiple Vitamins-Minerals (HAIR SKIN AND NAILS FORMULA PO) Take 1 tablet by mouth daily. 08/02/19   [provider]  omeprazole  (PRILOSEC) 20 MG capsule Take 1 capsule (20 mg total) by mouth daily. 07/26/24   Henson, Vickie L, NP-C  psyllium (REGULOID) 0.52 g capsule Take 0.52 g by mouth daily.    [provider]    Allergies: Latex    Review of Systems  Unable to perform ROS: Mental status change  Neurological:  Positive for seizures.    Updated Vital Signs BP 99/65   Pulse 76   Temp (!) 97.4 F (36.3 C) (Axillary)   Resp 14   Ht 5' 4 (1.626 m)   Wt 63.5 kg   SpO2 99%  BMI 24.03 kg/m   Physical Exam Vitals and nursing note reviewed.   63 year old female, unresponsive to verbal and painful stimuli, in no acute distress.  She does have spontaneous respirations.  Vital signs are significant for borderline low blood pressure. Oxygen saturation is 99%, which is normal. Head is normocephalic and atraumatic. PERRLA.  Corneal reflex is present. Oropharynx is clear. Neck is supple without adenopathy. Lungs are clear without rales, wheezes, or rhonchi. Chest moves symmetrically. Heart has regular rate and rhythm without murmur. Abdomen is soft, flat. Extremities have no cyanosis or edema.  Short arm cast is present on the right. Skin is warm  and dry without rash. Neurologic: Mental status is as noted above, no spontaneous movement, but she is protecting her airway.  (all labs ordered are listed, but only abnormal results are displayed) Labs Reviewed  BASIC METABOLIC PANEL WITH GFR  CBC  CBG MONITORING, ED  CBG MONITORING, ED    EKG: EKG Interpretation Date/Time:  Saturday August 12 2024 00:29:35 EDT Ventricular Rate:  76 PR Interval:  155 QRS Duration:  155 QT Interval:  406 QTC Calculation: 457 R Axis:   53  Text Interpretation: Sinus rhythm Nonspecific intraventricular conduction delay When compared with ECG of 07/25/2023, No significant change was found Confirmed by Raford Lenis ([id number]) on 08/12/2024 12:48:07 AM  Radiology: No results found.   Procedures   Medications Ordered in the ED  sodium chloride  0.9 % bolus 1,000 mL (has no administration in time range)                                    Medical Decision Making Amount and/or Complexity of Data Reviewed Labs: ordered.  Risk Prescription drug management.   Seizure in patient with known history of seizure disorder with ongoing decreased level of consciousness.  This a presentation with a wide range of treatment options and carries with it a high risk of morbidity and complications.  Differential diagnosis includes, but is not limited to, sedation from midazolam , prolonged postictal state, ongoing subclinical seizures.  Because of low blood pressure, I have ordered IV fluids.  I have reviewed her electrocardiogram, my interpretation is nonspecific intraventricular conduction delay unchanged from prior.  I have reviewed her past records, and she does have ED visits for seizure on 07/24/2023 and 07/18/2022.  In addition to screening labs, I have ordered a levetiracetam  level.  I have reviewed her laboratory tests, my interpretation is borderline hypokalemia and I have ordered a dose of oral potassium, normal CBC.  I have observed her in the emergency  department and she is now awake.  She is slow to answer but she is oriented.  Husband states that she is back to her baseline.  I am discharging her with instructions to follow-up with her neurologist.     Final diagnoses:  Seizure [facility])  Hypokalemia    ED Discharge Orders     None          Raford Lenis, MD 08/12/24 [phone]

## 2024-08-12 NOTE — Discharge Instructions (Addendum)
 Please make sure to follow-up with your neurologist.  I ordered a blood level of levetiracetam  which he will be able to check when you see him.  That will help him decide whether you need to have any adjustments in your medication dose.  Return to the emergency department if you have any additional problems.

## 2024-08-12 NOTE — ED Triage Notes (Addendum)
 Pt BIB by [facility], pt is from home w/ c/o witnessed seizure in bed tonight. Per husband,pt has hx of seizures and has Periods of apnea post seizures, so husband began CPR. Patient was unresponsive upon EMS arrival but did have a pulse.Seconds seizure en route lasting . EMS administered 2.5mg  of versed  IV. Pt arrives to ER lethargic and responds to painful stimuli. Responds to simple commands.   CBG 69 at arrival 20g IV placed by EMS L wrist 25g D10 CBG 120   Hx of seizures no medications   80 HR 105/69 BP 100% RA

## 2024-08-12 NOTE — ED Notes (Signed)
 Patient's family member given a recliner.

## 2024-08-14 LAB — LEVETIRACETAM LEVEL: Levetiracetam Lvl: 12.2 ug/mL (ref 10.0–40.0)

## 2024-08-22 DIAGNOSIS — G5601 Carpal tunnel syndrome, right upper limb: Secondary | ICD-10-CM | POA: Diagnosis not present

## 2024-08-22 DIAGNOSIS — M4326 Fusion of spine, lumbar region: Secondary | ICD-10-CM | POA: Diagnosis not present

## 2024-08-24 DIAGNOSIS — M4326 Fusion of spine, lumbar region: Secondary | ICD-10-CM | POA: Diagnosis not present

## 2024-08-28 DIAGNOSIS — M4326 Fusion of spine, lumbar region: Secondary | ICD-10-CM | POA: Diagnosis not present

## 2024-09-01 DIAGNOSIS — M4326 Fusion of spine, lumbar region: Secondary | ICD-10-CM | POA: Diagnosis not present

## 2024-09-05 DIAGNOSIS — M4326 Fusion of spine, lumbar region: Secondary | ICD-10-CM | POA: Diagnosis not present

## 2024-09-08 DIAGNOSIS — M4326 Fusion of spine, lumbar region: Secondary | ICD-10-CM | POA: Diagnosis not present

## 2024-09-15 NOTE — Progress Notes (Signed)
 09/18/2024 Emily Graham [id number] October 29, 1961  Referring provider: Lendia Boby CROME, NP-C Primary GI doctor: Dr. Legrand  ASSESSMENT AND PLAN:  MASH/liver fibrosis 02/13/2015 elastography fibrosis score F2 and F3 04/25/2015 liver biopsy    Latest Ref Rng & Units 07/26/2024    3:22 PM 06/02/2024    1:51 PM 02/08/2024    2:05 PM  Hepatic Function  Total Protein 6.0 - 8.3 g/dL 7.4  6.8  6.9   Albumin  3.5 - 5.2 g/dL 4.5  4.2  4.2   AST 0 - 37 U/L 41  33  42   ALT 0 - 35 U/L 44  46  57   Alk Phosphatase 39 - 117 U/L 90  66  68   Total Bilirubin 0.2 - 1.2 mg/dL 0.6  0.4  0.4   Bilirubin, Direct 0.0 - 0.3 mg/dL 0.2   0.1    Platelets 203  On tylenol , on Keppra  07/2023 FIB 4 = 1.92 further investigation Suspect continuing fibrosis/MASH, rule out other hepatocellular issues, possible DILI from Keppra  - Labs to include: hepatitis panel, IgG, ANA, Antismooth muscle antibody, AMA, celiac, thyroid  - will get INR to evaluate function -will get RUQ US  with elastography, consider fibroscan - need LFTs and CBC monitored every 6 months -Continue to work on risk factor modification including diet exercise and control of risk factors including blood sugars. - Counseled on ETOH cessation.  - may be a candidate for GLP1  GERD s/p gastric bypass with B12 def with nausea, vomiting Status post cholecystectomy 2014 Nocturnal symptoms, vomiting yellow bile, has adjustable bed that helps, started on prilosec 20 mg at night with improvement of her sx No NSAIDS, social drinker weekends, rare ETOH No dysphagia, melena Discussed with patient, will proceed with Egd - continue prilosec 20 mg -Lifestyle changes discussed, avoid NSAIDS, ETOH, hand out given to the patient -Schedule EGD at [facility] to evaluate GERD, esophagitis, hiatal hernia,H pylori. I discussed risks of EGD with patient today, including risk of sedation, bleeding or perforation. Patient provides understanding and gave verbal consent to  proceed.  Personal history of colon polyps 01/28/2018 colonoscopy Dr. Legrand excellent prep unremarkable no specimens collected recall 10 years  Seizure DO On Keppra  since August 2024 Last one was August 2025 but has not had another Follows with neurology tomorrow Suggest get sleep study  Patient Care Team: Lendia Boby CROME, NP-C as PCP - General (Family Medicine)  HISTORY OF PRESENT ILLNESS: 63 y.o. female with a past medical history listed below presents for evaluation of LFT and GERD.   Discussed the use of AI scribe software for clinical note transcription with the patient, who gave verbal consent to proceed.  History of Present Illness   Emily Graham is a 63 year old female with liver fibrosis who presents for follow-up on liver function and management of acid reflux. She was referred by [facility] for follow-up on liver function and acid reflux management.  She has a history of liver fibrosis diagnosed in 2016 with an F2-F3 fibrosis stage. A liver biopsy and elastography were performed in 2012. Recent liver enzyme tests showed slight elevation, which she attributes to a recent wrist injection. No follow-up elastography has been conducted since the initial diagnosis. No family history of liver issues is reported.  She experiences severe acid reflux, characterized by regurgitation of a yellow substance that burns her chest, throat, and ears, primarily occurring at night. She has purchased an adjustable bed to sleep slightly elevated and takes 20 mg  of Prilosec nightly, which has alleviated her symptoms. No trouble swallowing, dark stools, or weight loss is reported.  She has a history of a near-fatal seizure in August 2024 and has been on Keppra  since then. She reports having one seizure every August for the past 30 years. She also takes gabapentin  nightly and Tylenol  Arthritis as needed. She denies taking NSAIDs like Aleve or ibuprofen.  She was diagnosed with mild sleep apnea in her late  twenties and had a sleep study done at [facility]. She used a CPAP machine but found it uncomfortable and discontinued its use. She reports being very active in her sleep, often removing clothing or the CPAP mask during sleep.  Socially, she describes herself as a weekend social drinker, consuming two glasses of wine or two beers on social occasions. She denies any history of smoking.     She  reports that she has never smoked. She has never used smokeless tobacco. She reports current alcohol use of about 3.0 standard drinks of alcohol per week. She reports that she does not use drugs.  RELEVANT GI HISTORY, IMAGING AND LABS: Results   LABS Liver function: Elevated (01/2024) ALT: Elevated (01/2024)  RADIOLOGY Liver elastography: Performed (2012)  DIAGNOSTIC Colonoscopy: Performed (2019) Sleep study: Mild sleep apnea  PATHOLOGY Liver biopsy: Performed (2012)     2016 liver biopsy The biopsy fragments reveal hepatic parenchyma with mixed micro and macrosteatosis, occupying approximately 15 to 20% of the tissue. The steatosis is predominantly macrosteatosis. There are scattered foci associated with inflammatory cells as well as hepatocyte degeneration, consistent with steatohepatitis. The inflammatory cells consist predominantly of chronic inflammatory cells, including lymphocytes and abundant macrophages. No significant plasma cell nor neutrophil infiltrate is identified. A trichrome stain reveals minimal portal fibrosis with some extension into sinusoids. There is no portal to portal (cirrhosis) bridging. Iron  stains and reticulin stains are essentially normal. A PAS stain reveals increased intrahepatocyte PAS positive deposition. The differential diagnosis of the histologic picture is broad, but includes non-alcoholic fatty liver disease (NAFLD). Clinical and serologic correlation is necessary.   CBC    Component Value Date/Time   WBC 5.0 08/12/2024 0050   RBC 4.23  08/12/2024 0050   HGB 13.9 08/12/2024 0050   HCT 41.9 08/12/2024 0050   PLT 203 08/12/2024 0050   MCV 99.1 08/12/2024 0050   MCH 32.9 08/12/2024 0050   MCHC 33.2 08/12/2024 0050   RDW 12.0 08/12/2024 0050   LYMPHSABS 1.1 06/02/2024 1351   MONOABS 0.5 06/02/2024 1351   EOSABS 0.1 06/02/2024 1351   BASOSABS 0.0 06/02/2024 1351   Recent Labs    02/08/24 1405 03/06/24 1400 06/02/24 1351 08/12/24 0050  HGB 15.1* 15.3* 13.8 13.9    CMP     Component Value Date/Time   NA 145 08/12/2024 0050   K 3.4 (L) 08/12/2024 0050   CL 113 (H) 08/12/2024 0050   CO2 23 08/12/2024 0050   GLUCOSE 84 08/12/2024 0050   BUN 10 08/12/2024 0050   CREATININE 0.49 08/12/2024 0050   CREATININE 0.61 03/01/2023 1000   CALCIUM 8.9 08/12/2024 0050   PROT 7.4 07/26/2024 1522   ALBUMIN  4.5 07/26/2024 1522   AST 41 (H) 07/26/2024 1522   ALT 44 (H) 07/26/2024 1522   ALKPHOS 90 07/26/2024 1522   BILITOT 0.6 07/26/2024 1522   GFRNONAA >60 08/12/2024 0050   GFRNONAA 87 02/11/2021 1500   GFRAA 100 02/11/2021 1500      Latest Ref Rng & Units 07/26/2024  3:22 PM 06/02/2024    1:51 PM 02/08/2024    2:05 PM  Hepatic Function  Total Protein 6.0 - 8.3 g/dL 7.4  6.8  6.9   Albumin  3.5 - 5.2 g/dL 4.5  4.2  4.2   AST 0 - 37 U/L 41  33  42   ALT 0 - 35 U/L 44  46  57   Alk Phosphatase 39 - 117 U/L 90  66  68   Total Bilirubin 0.2 - 1.2 mg/dL 0.6  0.4  0.4   Bilirubin, Direct 0.0 - 0.3 mg/dL 0.2   0.1       Current Medications:    Current Outpatient Medications (Cardiovascular):    EPINEPHrine  0.3 mg/0.3 mL IJ SOAJ injection, Inject 0.3 mg into the muscle as needed for anaphylaxis.  Current Outpatient Medications (Respiratory):    albuterol  (VENTOLIN  HFA) 108 (90 Base) MCG/ACT inhaler, Inhale 1-2 puffs into the lungs every 6 (six) hours as needed for wheezing or shortness of breath.   loratadine  (CLARITIN ) 10 MG tablet, Take 10 mg by mouth daily.  Current Outpatient Medications (Analgesics):     acetaminophen  (TYLENOL ) 650 MG CR tablet, Take 1,300 mg by mouth every 8 (eight) hours as needed for pain.   aspirin EC 81 MG tablet, Take 81 mg by mouth daily. Swallow whole.  Current Outpatient Medications (Hematological):    IRON , FERROUS SULFATE , PO, Take 27 mg by mouth daily.  Current Outpatient Medications (Other):    APPLE CIDER VINEGAR PO, Take 2 tablets by mouth daily.   b complex vitamins tablet, Take 1 tablet by mouth daily.   Brimonidine  Tartrate (LUMIFY ) 0.025 % SOLN, Place 1 drop into both eyes daily as needed (redness/irritation).   gabapentin  (NEURONTIN ) 600 MG tablet, Take 1 tablet (600 mg total) by mouth at bedtime.   levETIRAcetam  (KEPPRA ) 500 MG tablet, TAKE 1 TABLET BY MOUTH TWICE DAILY   Midazolam  (NAYZILAM ) 5 MG/0.1ML SOLN, Place 5 mg into the nose as needed (for seizure clusters or seizure lasting more than 2 minutes).   Milk Thistle 1000 MG CAPS, Take 1,000 mg by mouth daily.   Multiple Vitamin (MULTIVITAMIN) tablet, Take 1 tablet by mouth daily.    Multiple Vitamins-Minerals (HAIR SKIN AND NAILS FORMULA PO), Take 1 tablet by mouth daily.   Omega-3 Fatty Acids (FISH OIL) 1000 MG CAPS, Take 1 capsule by mouth daily.   omeprazole  (PRILOSEC) 20 MG capsule, Take 1 capsule (20 mg total) by mouth daily.   psyllium (REGULOID) 0.52 g capsule, Take 0.52 g by mouth daily.   TURMERIC PO, Take 1,500 mg by mouth daily.  Medical History:  Past Medical History:  Diagnosis Date   ADD (attention deficit disorder) 07/19/2013   Anal fissure    Anemia    Arthritis    Asthma    Cholecystitis, acute with cholelithiasis 07/19/2013   Chronic left shoulder pain 07/19/2013   Colon polyps    Complication of anesthesia    pt aspirated during leg lift surgery.   Dyspnea    Gallstones    GERD (gastroesophageal reflux disease) 07/19/2013   Goiter    HTN (hypertension)    Hypoglycemia    Hypoglycemia 07/19/2013   Murmur    Neuromuscular disorder (HCC)    Pneumonia    Pre-diabetes     Seizure disorder, primary generalized (HCC) 07/19/2013   May be related to hypoglycemia   Seizures (HCC) last 01/08/18   2013-grand mal due to hypoglycemia   Sleep apnea  mild-not using CPAP per pt   Allergies:  Allergies  Allergen Reactions   Latex Rash     Surgical History:  She  has a past surgical history that includes Abdominal hysterectomy; Breast enhancement surgery; Total shoulder replacement (Right); Stomach tuck; Cholecystectomy (N/A, 07/20/2013); Gastric bypass (2000); leg lift (Bilateral, 2008); Joint replacement (Bilateral); Anterior lumbar fusion (N/A, 03/15/2024); Abdominal exposure (N/A, 03/15/2024); Wrist fusion (Right); and Nose surgery. Family History:  Her family history includes Arthritis in her daughter; Breast cancer in her maternal aunt, paternal aunt, and paternal uncle; Cancer in her cousin and maternal aunt; Coronary artery disease in her father; Diabetes in her father; Heart disease in her father; Hyperlipidemia in her brother and mother; Hypertension in her brother, father, and mother; Kidney disease in her mother; Obesity in her son; Skin cancer in her mother; Stroke in her mother; Thyroid  disease in her daughter; Uterine cancer in her maternal grandmother.  REVIEW OF SYSTEMS  : All other systems reviewed and negative except where noted in the History of Present Illness.  PHYSICAL EXAM: BP 110/60 (BP Location: Left Arm, Patient Position: Sitting, Cuff Size: Normal)   Pulse 80   Ht 5' 0.75 (1.543 m) Comment: height measured without shoes  Wt 141 lb 6 oz (64.1 kg)   BMI 26.93 kg/m  Physical Exam   GENERAL APPEARANCE: Well nourished, in no apparent distress. HEENT: No cervical lymphadenopathy, unremarkable thyroid , sclerae anicteric, conjunctiva pink. RESPIRATORY: Respiratory effort normal, BS equal bilateral without rales, rhonchi, wheezing. CARDIO: RRR with no MRGs, peripheral pulses intact. ABDOMEN: Soft, non distended, active bowel sounds in all  4 quadrants, no tenderness to palpation, no rebound, no mass appreciated, liver not enlarged. RECTAL: Declines. MUSCULOSKELETAL: Full ROM, normal gait, without edema. SKIN: Dry, intact without rashes or lesions. No jaundice. NEURO: Alert, oriented, no focal deficits. PSYCH: Cooperative, normal mood and affect.      Alan JONELLE Coombs, PA-C 10:39 AM

## 2024-09-18 ENCOUNTER — Ambulatory Visit: Admitting: Physician Assistant

## 2024-09-18 ENCOUNTER — Other Ambulatory Visit (INDEPENDENT_AMBULATORY_CARE_PROVIDER_SITE_OTHER)

## 2024-09-18 ENCOUNTER — Encounter: Payer: Self-pay | Admitting: Physician Assistant

## 2024-09-18 ENCOUNTER — Other Ambulatory Visit: Payer: Self-pay

## 2024-09-18 VITALS — BP 110/60 | HR 80 | Ht 60.75 in | Wt 141.4 lb

## 2024-09-18 DIAGNOSIS — K7400 Hepatic fibrosis, unspecified: Secondary | ICD-10-CM

## 2024-09-18 DIAGNOSIS — R7989 Other specified abnormal findings of blood chemistry: Secondary | ICD-10-CM | POA: Diagnosis not present

## 2024-09-18 DIAGNOSIS — R569 Unspecified convulsions: Secondary | ICD-10-CM

## 2024-09-18 DIAGNOSIS — Z9049 Acquired absence of other specified parts of digestive tract: Secondary | ICD-10-CM

## 2024-09-18 DIAGNOSIS — Z860100 Personal history of colon polyps, unspecified: Secondary | ICD-10-CM

## 2024-09-18 DIAGNOSIS — K7581 Nonalcoholic steatohepatitis (NASH): Secondary | ICD-10-CM | POA: Diagnosis not present

## 2024-09-18 DIAGNOSIS — K219 Gastro-esophageal reflux disease without esophagitis: Secondary | ICD-10-CM

## 2024-09-18 DIAGNOSIS — G40309 Generalized idiopathic epilepsy and epileptic syndromes, not intractable, without status epilepticus: Secondary | ICD-10-CM

## 2024-09-18 DIAGNOSIS — Z9884 Bariatric surgery status: Secondary | ICD-10-CM

## 2024-09-18 LAB — CBC WITH DIFFERENTIAL/PLATELET
Basophils Absolute: 0 K/uL (ref 0.0–0.1)
Basophils Relative: 0.2 % (ref 0.0–3.0)
Eosinophils Absolute: 0.2 K/uL (ref 0.0–0.7)
Eosinophils Relative: 4.2 % (ref 0.0–5.0)
HCT: 44.6 % (ref 36.0–46.0)
Hemoglobin: 14.9 g/dL (ref 12.0–15.0)
Lymphocytes Relative: 23.2 % (ref 12.0–46.0)
Lymphs Abs: 1.2 K/uL (ref 0.7–4.0)
MCHC: 33.4 g/dL (ref 30.0–36.0)
MCV: 96.5 fl (ref 78.0–100.0)
Monocytes Absolute: 0.4 K/uL (ref 0.1–1.0)
Monocytes Relative: 8.1 % (ref 3.0–12.0)
Neutro Abs: 3.4 K/uL (ref 1.4–7.7)
Neutrophils Relative %: 64.3 % (ref 43.0–77.0)
Platelets: 201 K/uL (ref 150.0–400.0)
RBC: 4.62 Mil/uL (ref 3.87–5.11)
RDW: 12.1 % (ref 11.5–15.5)
WBC: 5.2 K/uL (ref 4.0–10.5)

## 2024-09-18 LAB — HEPATIC FUNCTION PANEL
ALT: 45 U/L — ABNORMAL HIGH (ref 0–35)
AST: 38 U/L — ABNORMAL HIGH (ref 0–37)
Albumin: 4.4 g/dL (ref 3.5–5.2)
Alkaline Phosphatase: 81 U/L (ref 39–117)
Bilirubin, Direct: 0.1 mg/dL (ref 0.0–0.3)
Total Bilirubin: 0.4 mg/dL (ref 0.2–1.2)
Total Protein: 7 g/dL (ref 6.0–8.3)

## 2024-09-18 LAB — BASIC METABOLIC PANEL WITH GFR
BUN: 15 mg/dL (ref 6–23)
CO2: 22 meq/L (ref 19–32)
Calcium: 9.1 mg/dL (ref 8.4–10.5)
Chloride: 106 meq/L (ref 96–112)
Creatinine, Ser: 0.56 mg/dL (ref 0.40–1.20)
GFR: 96.76 mL/min (ref >60.00)
Glucose, Bld: 89 mg/dL (ref 70–99)
Potassium: 4.4 meq/L (ref 3.5–5.1)
Sodium: 142 meq/L (ref 135–145)

## 2024-09-18 LAB — PROTIME-INR
INR: 1.3 ratio — ABNORMAL HIGH (ref 0.8–1.0)
Prothrombin Time: 13.3 s — ABNORMAL HIGH (ref 9.6–13.1)

## 2024-09-18 LAB — GAMMA GT: GGT: 17 U/L (ref 7–51)

## 2024-09-18 NOTE — Progress Notes (Signed)
 ____________________________________________________________  Attending physician addendum:  Thank you for sending this case to me. I have reviewed the entire note and agree with the plan.  If EGD unrevealing for an identifiable explanation of this severe GERD and the patient with gastric bypass, UGIS may be warranted to evaluate for more distal anatomical issues or candycane syndrome.  Agree with additional lab workup with the LFTs, but previous biopsy findings suggest this is almost certainly MASH as you suggest, and she could have developed more significant fibrosis in the interim.  Please let me know of the results of the elastography, as it might warrant referral to hepatology consultant for consideration of additional imaging, biopsy and possible candidacy for GLP-1 agonist  Victory Brand, MD  ____________________________________________________________

## 2024-09-18 NOTE — Patient Instructions (Signed)
 Your provider has requested that you go to the basement level for lab work before leaving today. Press B on the elevator. The lab is located at the first door on the left as you exit the elevator.  You have been scheduled for an abdominal ultrasound with elastography at [facility] (1st floor of hospital) on 09/22/2024 at 7:30am. Please arrive 15 minutes prior to your appointment for registration. Make certain not to have anything to eat or drink after midnight prior to your appointment. Should you need to reschedule your appointment, please contact radiology at [phone]. This test typically takes about 30 minutes to perform.   Please take your proton pump inhibitor medication, prilosec  Please take this medication 30 minutes to 1 hour before meals- this makes it more effective.  Avoid spicy and acidic foods Avoid fatty foods Limit your intake of coffee, tea, alcohol, and carbonated drinks Work to maintain a healthy weight Keep the head of the bed elevated at least 3 inches with blocks or a wedge Rance if you are having any nighttime symptoms Stay upright for 2 hours after eating Avoid meals and snacks three to four hours before bedtime   Metabolic dysfunction associated seatohepatitis  Now the leading cause of liver failure in the [place] .  It is normally from such risk factors as obesity, diabetes, insulin  resistance, high cholesterol, or metabolic syndrome.  The only definitive therapy is weight loss and exercise.   Suggest walking 20-30 mins daily.  Decreasing carbohydrates, increasing veggies.    Fatty Liver Fatty liver is the accumulation of fat in liver cells. It is also called hepatosteatosis or steatohepatitis. It is normal for your liver to contain some fat. If fat is more than 5 to 10% of your liver's weight, you have fatty liver.  There are often no symptoms (problems) for years while damage is still occurring. People often learn about their fatty liver  when they have medical tests for other reasons. Fat can damage your liver for years or even decades without causing problems. When it becomes severe, it can cause fatigue, weight loss, weakness, and confusion. This makes you more likely to develop more serious liver problems. The liver is the largest organ in the body. It does a lot of work and often gives no warning signs when it is sick until late in a disease. The liver has many important jobs including: Breaking down foods. Storing vitamins, iron , and other minerals. Making proteins. Making bile for food digestion. Breaking down many products including medications, alcohol and some poisons.  PROGNOSIS  Fatty liver may cause no damage or it can lead to an inflammation of the liver. This is, called steatohepatitis.  Over time the liver may become scarred and hardened. This condition is called cirrhosis. Cirrhosis is serious and may lead to liver failure or cancer. NASH is one of the leading causes of cirrhosis. About 10-20% of Americans have fatty liver and a smaller 2-5% has NASH.  TREATMENT  Weight loss, fat restriction, and exercise in overweight patients produces inconsistent results but is worth trying. Good control of diabetes may reduce fatty liver. Eat a balanced, healthy diet. Increase your physical activity. There are no medical or surgical treatments for a fatty liver or NASH, but improving your diet and increasing your exercise may help prevent or reverse some of the damage.

## 2024-09-19 ENCOUNTER — Ambulatory Visit: Payer: Self-pay | Admitting: Physician Assistant

## 2024-09-19 ENCOUNTER — Ambulatory Visit (INDEPENDENT_AMBULATORY_CARE_PROVIDER_SITE_OTHER): Admitting: Neurology

## 2024-09-19 ENCOUNTER — Encounter: Payer: Self-pay | Admitting: Neurology

## 2024-09-19 VITALS — BP 130/84 | HR 86 | Ht 61.0 in | Wt 142.5 lb

## 2024-09-19 DIAGNOSIS — R0683 Snoring: Secondary | ICD-10-CM

## 2024-09-19 DIAGNOSIS — M19131 Post-traumatic osteoarthritis, right wrist: Secondary | ICD-10-CM | POA: Insufficient documentation

## 2024-09-19 DIAGNOSIS — Z8669 Personal history of other diseases of the nervous system and sense organs: Secondary | ICD-10-CM

## 2024-09-19 DIAGNOSIS — G5601 Carpal tunnel syndrome, right upper limb: Secondary | ICD-10-CM | POA: Insufficient documentation

## 2024-09-19 DIAGNOSIS — G40909 Epilepsy, unspecified, not intractable, without status epilepticus: Secondary | ICD-10-CM | POA: Diagnosis not present

## 2024-09-19 MED ORDER — LEVETIRACETAM 500 MG PO TABS: 500.0000 mg | ORAL_TABLET | Freq: Two times a day (BID) | ORAL | 4 refills | Status: AC

## 2024-09-19 NOTE — Patient Instructions (Addendum)
 Continue with levetiracetam  500 mg twice daily Continue your other medications Please contact me if you do have another seizure Will refer patient to sleep neurology for evaluation of sleep apnea Return in 1 year or sooner if worse

## 2024-09-19 NOTE — Progress Notes (Signed)
 [facility]  PATIENT: Emily Graham DOB: 1961-01-24  REQUESTING CLINICIAN: Tonita Fallow, MD HISTORY FROM: Patient  REASON FOR VISIT: Seizure   HISTORICAL  CHIEF COMPLAINT:  Chief Complaint  Patient presents with   Follow-up    Pt in room 13.alone.Here for seizure follow up.    INTERVAL HISTORY 09/19/2024 Patient presents today for follow-up, last visit was in October 2024, since then, she tells me that she was doing well but again unfortunately on August 30,  She did have a seizure out of sleep.  She was taken to the hospital, denies any major injury, no tongue biting or urinary incontinence.  She reports compliance with her levetiracetam , her level was normal at 12.  She tells me around the seizure, she was dealing with high level of stress, her daughter was going through a divorce and she was not sleeping at night.  Today she also reports a history of sleep apnea in her 74s, tells me that she could not tolerate her CPAP at that time.  Husband has always reported loud snoring, unsettling sleep, moving a lot and waking up during sleep gasping for air.  There is also report of feeling tired during the daytime and falling asleep easily while watching TV.   INTERVAL HISTORY 09/29/2023:  Emily Graham presents today for follow-up, she is alone, last visit was in August of last year.  At that time, she had a syncope versus seizure, was not taking the prescribed Keppra .  We obtained a routine EEG which was normal.  She was doing well, off Keppra  until August 10 when she did have a seizure out of sleep.  She was told by her husband that she had to have a generalized convulsion.  EMS was called.  Patient also did have 2 additional seizures with EMS and Versed  was given.  In the ED she was postictal, she was loaded on Keppra  and started on Keppra  500 mg twice daily.  She is compliant with the medication, denies any side effect.  No other complaints, no other concerns and no  additional seizures.    HISTORY OF PRESENT ILLNESS:  This is a 63 year old woman with past medical history of seizure disorder, described as generalized seizure, last seizure was more than 10 years ago, asthma who is presenting after seizure-like activity.  Patient reports the day prior to the event on August 4 she was working at [employer], she was on her feet all day long, the next day, the day of the event, she continued to work in [employer]. It was a hot day and she was not eating until about 230 PM.  She reports eating a piece of cake and going back in the kitchen pantry, suddenly felt dizzy, felt hot, asked for help, reported they took her to the [facility], she remember all and this was the last thing that she remembers.   She was told that she passed out total of 3 times, she remembers waking up in the ambulance with EMS.  Per chart review her initial blood sugar was 79.  In the ED her initial work-up was negative, she was given IV Keppra  and discharged home on Keppra . She currently not taking the Keppra . Patient reported this event is different from her typical seizures.  Her typical seizure used to be generalized tonic-clonic seizure with tongue biting, urinary incontinence follow-up with but extreme exertion and muscle ache.  She did not have any of those additional symptoms. In the past, she was on Dilantin,  Carbamazepine and Levetiracetam .   Handedness: Right handed   Onset: 07/18/2022, prior to that last seizure was 10 year ago   Seizure Type: Reported a generalized convulsion but this event was described as passing out  Current frequency: August 12 2024, July 18 2022, her last event was more than 10 years ago  Any injuries from seizures: Denies   Seizure risk factors: Skull fracture (hairline)  Previous ASMs: Dilantin, Carbamazepine, Keppra    Currenty ASMs: Levetiracetam  500 mg twice daily   ASMs side effects: Denies  Brain Images: Normal CT   Previous EEGs: Not available  for review   OTHER MEDICAL CONDITIONS: asthma, seizure, sleep apnea  REVIEW OF SYSTEMS: Full 14 system review of systems performed and negative with exception of: As noted in the HPI   ALLERGIES: Allergies  Allergen Reactions   Latex Rash    HOME MEDICATIONS: Outpatient Medications Prior to Visit  Medication Sig Dispense Refill   acetaminophen  (TYLENOL ) 650 MG CR tablet Take 1,300 mg by mouth every 8 (eight) hours as needed for pain.     albuterol  (VENTOLIN  HFA) 108 (90 Base) MCG/ACT inhaler Inhale 1-2 puffs into the lungs every 6 (six) hours as needed for wheezing or shortness of breath. 18 g 0   APPLE CIDER VINEGAR PO Take 2 tablets by mouth daily.     aspirin EC 81 MG tablet Take 81 mg by mouth daily. Swallow whole.     b complex vitamins tablet Take 1 tablet by mouth daily.     Brimonidine  Tartrate (LUMIFY ) 0.025 % SOLN Place 1 drop into both eyes daily as needed (redness/irritation).     EPINEPHrine  0.3 mg/0.3 mL IJ SOAJ injection Inject 0.3 mg into the muscle as needed for anaphylaxis. 1 each 0   gabapentin  (NEURONTIN ) 600 MG tablet Take 1 tablet (600 mg total) by mouth at bedtime. 90 tablet 1   IRON , FERROUS SULFATE , PO Take 27 mg by mouth daily.     loratadine  (CLARITIN ) 10 MG tablet Take 10 mg by mouth daily.     Midazolam  (NAYZILAM ) 5 MG/0.1ML SOLN Place 5 mg into the nose as needed (for seizure clusters or seizure lasting more than 2 minutes). 2 each 0   Milk Thistle 1000 MG CAPS Take 1,000 mg by mouth daily.     Multiple Vitamin (MULTIVITAMIN) tablet Take 1 tablet by mouth daily.      Multiple Vitamins-Minerals (HAIR SKIN AND NAILS FORMULA PO) Take 1 tablet by mouth daily.     Omega-3 Fatty Acids (FISH OIL) 1000 MG CAPS Take 1 capsule by mouth daily.     omeprazole  (PRILOSEC) 20 MG capsule Take 1 capsule (20 mg total) by mouth daily. 30 capsule 2   psyllium (REGULOID) 0.52 g capsule Take 0.52 g by mouth daily.     TURMERIC PO Take 1,500 mg by mouth daily.      levETIRAcetam  (KEPPRA ) 500 MG tablet TAKE 1 TABLET BY MOUTH TWICE DAILY 180 tablet 0   No facility-administered medications prior to visit.    PAST MEDICAL HISTORY: Past Medical History:  Diagnosis Date   ADD (attention deficit disorder) 07/19/2013   Anal fissure    Anemia    Arthritis    Asthma    Cholecystitis, acute with cholelithiasis 07/19/2013   Chronic left shoulder pain 07/19/2013   Colon polyps    Complication of anesthesia    pt aspirated during leg lift surgery.   Dyspnea    Gallstones    GERD (gastroesophageal reflux disease) 07/19/2013  Goiter    HTN (hypertension)    Hypoglycemia    Hypoglycemia 07/19/2013   Murmur    Neuromuscular disorder (HCC)    Pneumonia    Pre-diabetes    Seizure disorder, primary generalized (HCC) 07/19/2013   May be related to hypoglycemia   Seizures (HCC) last 01/08/18   2013-grand mal due to hypoglycemia   Sleep apnea    mild-not using CPAP per pt    PAST SURGICAL HISTORY: Past Surgical History:  Procedure Laterality Date   ABDOMINAL EXPOSURE N/A 03/15/2024   Procedure: ABDOMINAL EXPOSURE;  Surgeon: Gretta Lonni PARAS, MD;  Location: [facility];  Service: Vascular;  Laterality: N/A;   ABDOMINAL HYSTERECTOMY     ANTERIOR LUMBAR FUSION N/A 03/15/2024   Procedure: ANTERIOR LUMBAR FUSION 1 LEVEL;  Surgeon: Beuford Anes, MD;  Location: [facility] OR;  Service: Orthopedics;  Laterality: N/A;  ANTERIOR LUMBAR INTERBODY FUSION LUMBAR 4- LUMBAR 5 WITH INSTRUMENTATION AND ALLOGRAFT WITH POSTERIOR SPINAL FUSION WITH INSTRUMENTATION AND ALLOGRAFT   BREAST ENHANCEMENT SURGERY     CHOLECYSTECTOMY N/A 07/20/2013   Procedure: LAPAROSCOPIC CHOLECYSTECTOMY WITH INTRAOPERATIVE CHOLANGIOGRAM;  Surgeon: Alm VEAR Angle, MD;  Location: [facility] ORS;  Service: General;  Laterality: N/A;   GASTRIC BYPASS  2000   JOINT REPLACEMENT Bilateral    knees   leg lift Bilateral 2008   had aspiration in lungs, fluid was drained per pt/this was done at outpatient  center/[facility]   NOSE SURGERY     Stomach tuck     TOTAL SHOULDER REPLACEMENT Right    WRIST FUSION Right    with carpal tunnel repair    FAMILY HISTORY: Family History  Problem Relation Age of Onset   Stroke Mother    Skin cancer Mother    Hypertension Mother    Hyperlipidemia Mother    Kidney disease Mother    Coronary artery disease Father        MI at age 46   Heart disease Father    Hypertension Father    Diabetes Father    Hypertension Brother    Hyperlipidemia Brother    Uterine cancer Maternal Grandmother    Arthritis Daughter    Thyroid  disease Daughter    Obesity Son    Breast cancer Maternal Aunt    Cancer Maternal Aunt    Breast cancer Paternal Aunt    Breast cancer Paternal Uncle    Cancer Cousin    Colon cancer Neg Hx     SOCIAL HISTORY: Social History   Socioeconomic History   Marital status: Married    Spouse name: Not on file   Number of children: 2   Years of education: Not on file   Highest education level: 12th grade  Occupational History    Employer: UNEMPLOYED  Tobacco Use   Smoking status: Never   Smokeless tobacco: Never  Vaping Use   Vaping status: Never Used  Substance and Sexual Activity   Alcohol use: Yes    Alcohol/week: 3.0 standard drinks of alcohol    Types: 1 Standard drinks or equivalent, 2 Glasses of wine per week    Comment: Occasional/social    Drug use: No   Sexual activity: Yes  Other Topics Concern   Not on file  Social History Narrative   Right handed   Lives with husband   Caffeine - 1 cup daily   Social Drivers of Health   Financial Resource Strain: Low Risk  (05/29/2024)   Overall Financial Resource Strain (CARDIA)    Difficulty  of Paying Living Expenses: Not very hard  Food Insecurity: No Food Insecurity (05/29/2024)   Hunger Vital Sign    Worried About Running Out of Food in the Last Year: Never true    Ran Out of Food in the Last Year: Never true  Transportation Needs: No Transportation Needs (05/29/2024)    PRAPARE - Administrator, Civil Service (Medical): No    Lack of Transportation (Non-Medical): No  Physical Activity: Inactive (05/29/2024)   Exercise Vital Sign    Days of Exercise per Week: 0 days    Minutes of Exercise per Session: Not on file  Stress: No Stress Concern Present (05/29/2024)   [employer] of Occupational Health - Occupational Stress Questionnaire    Feeling of Stress: Only a little  Social Connections: Socially Integrated (05/29/2024)   Social Connection and Isolation Panel    Frequency of Communication with Friends and Family: More than three times a week    Frequency of Social Gatherings with Friends and Family: More than three times a week    Attends Religious Services: More than 4 times per year    Active Member of [employer] or Organizations: Yes    Attends Engineer, structural: More than 4 times per year    Marital Status: Married  Catering manager Violence: Not on file    PHYSICAL EXAM  GENERAL EXAM/CONSTITUTIONAL: Vitals:  Vitals:   09/19/24 1009  BP: 130/84  Pulse: 86  Weight: 142 lb 8 oz (64.6 kg)  Height: 5' 1 (1.549 m)   Body mass index is 26.93 kg/m. Wt Readings from Last 3 Encounters:  09/19/24 142 lb 8 oz (64.6 kg)  09/18/24 141 lb 6 oz (64.1 kg)  08/12/24 140 lb (63.5 kg)   Patient is in no distress; well developed, nourished and groomed; neck is supple  MUSCULOSKELETAL: Gait, strength, tone, movements noted in Neurologic exam below  NEUROLOGIC: MENTAL STATUS:      No data to display         awake, alert, oriented to person, place and time recent and remote memory intact normal attention and concentration language fluent, comprehension intact, naming intact fund of knowledge appropriate  CRANIAL NERVE:  2nd, 3rd, 4th, 6th - visual fields full to confrontation, extraocular muscles intact, no nystagmus 5th - facial sensation symmetric 7th - facial strength symmetric 8th - hearing intact 9th - palate  elevates symmetrically, uvula midline 11th - shoulder shrug symmetric 12th - tongue protrusion midline  MOTOR:  normal bulk and tone, full strength in the BUE, BLE. Right wrist in a brace  SENSORY:  normal and symmetric to light touch  COORDINATION:  finger-nose-finger, fine finger movements normal  GAIT/STATION:  normal   DIAGNOSTIC DATA (LABS, IMAGING, TESTING) - I reviewed patient records, labs, notes, testing and imaging myself where available.  Lab Results  Component Value Date   WBC 5.2 09/18/2024   HGB 14.9 09/18/2024   HCT 44.6 09/18/2024   MCV 96.5 09/18/2024   PLT 201.0 09/18/2024      Component Value Date/Time   NA 142 09/18/2024 1059   K 4.4 09/18/2024 1059   CL 106 09/18/2024 1059   CO2 22 09/18/2024 1059   GLUCOSE 89 09/18/2024 1059   BUN 15 09/18/2024 1059   CREATININE 0.56 09/18/2024 1059   CREATININE 0.61 03/01/2023 1000   CALCIUM 9.1 09/18/2024 1059   PROT 7.0 09/18/2024 1059   ALBUMIN  4.4 09/18/2024 1059   AST 38 (H) 09/18/2024 1059   ALT  45 (H) 09/18/2024 1059   ALKPHOS 81 09/18/2024 1059   BILITOT 0.4 09/18/2024 1059   GFRNONAA >60 08/12/2024 0050   GFRNONAA 87 02/11/2021 1500   GFRAA 100 02/11/2021 1500   Lab Results  Component Value Date   CHOL 139 06/02/2024   HDL 59.10 06/02/2024   LDLCALC 62 06/02/2024   TRIG 87.0 06/02/2024   Lab Results  Component Value Date   HGBA1C 5.0 06/02/2024   Lab Results  Component Value Date   VITAMINB12 1,340 (H) 06/02/2024   Lab Results  Component Value Date   TSH 0.94 06/02/2024    Head CT 07/18/2022 No acute intracranial abnormality.  Head CT 07/25/2023 Normal head CT   Routine EEG 08/11/2022 Normal   ASSESSMENT AND PLAN  63 y.o. year old female  with history of generalized seizure who is presenting for follow-up.  Unfortunately she did have a seizure on August 12, 2024, seizure out of sleep.  Prior to that it was August 2023 and prior to that was 10 years ago.  She reports compliance  with her medication, levetiracetam  500 mg twice daily and her level at the time of this seizure was normal at 12.  However she did report high stress related to her daughter going through a divorce and lack of sleep.  She continues on Keppra  500 mg twice daily and denies any side effect.  Will continue patient on Keppra  500 mg twice daily, refill given Due to her history of sleep apnea in her 20s, continued loud snoring, gasping for air, interrupted sleep due to waking up multiple times and daytime fatigue I will refer her for evaluation of sleep apnea.  I will see her in 1 year for follow-up or sooner if worse sleep and patient understand to contact me if she does have another breakthrough seizure.   1. Seizure disorder (HCC)   2. Snoring   3. History of sleep apnea      Patient Instructions  Continue with levetiracetam  500 mg twice daily Continue your other medications Please contact me if you do have another seizure Will refer patient to sleep neurology for evaluation of sleep apnea Return in 1 year or sooner if worse   Per [place]  DMV statutes, patients with seizures are not allowed to drive until they have been seizure-free for six months.  Other recommendations include using caution when using heavy equipment or power tools. Avoid working on ladders or at heights. Take showers instead of baths.  Do not swim alone.  Ensure the water  temperature is not too high on the home water  heater. Do not go swimming alone. Do not lock yourself in a room alone (i.e. bathroom). When caring for infants or small children, sit down when holding, feeding, or changing them to minimize risk of injury to the child in the event you have a seizure. Maintain good sleep hygiene. Avoid alcohol.  Also recommend adequate sleep, hydration, good diet and minimize stress.   During the Seizure  - First, ensure adequate ventilation and place patients on the floor on their left side  Loosen clothing around the  neck and ensure the airway is patent. If the patient is clenching the teeth, do not force the mouth open with any object as this can cause severe damage - Remove all items from the surrounding that can be hazardous. The patient may be oblivious to what's happening and may not even know what he or she is doing. If the patient is confused and wandering, either  gently guide him/her away and block access to outside areas - Reassure the individual and be comforting - Call 911. In most cases, the seizure ends before EMS arrives. However, there are cases when seizures may last over 3 to 5 minutes. Or the individual may have developed breathing difficulties or severe injuries. If a pregnant patient or a person with diabetes develops a seizure, it is prudent to call an ambulance. - Finally, if the patient does not regain full consciousness, then call EMS. Most patients will remain confused for about 45 to 90 minutes after a seizure, so you must use judgment in calling for help. - Avoid restraints but make sure the patient is in a bed with padded side rails - Place the individual in a lateral position with the neck slightly flexed; this will help the saliva drain from the mouth and prevent the tongue from falling backward - Remove all nearby furniture and other hazards from the area - Provide verbal assurance as the individual is regaining consciousness - Provide the patient with privacy if possible - Call for help and start treatment as ordered by the caregiver   After the Seizure (Postictal Stage)  After a seizure, most patients experience confusion, fatigue, muscle pain and/or a headache. Thus, one should permit the individual to sleep. For the next few days, reassurance is essential. Being calm and helping reorient the person is also of importance.  Most seizures are painless and end spontaneously. Seizures are not harmful to others but can lead to complications such as stress on the lungs, brain and the  heart. Individuals with prior lung problems may develop labored breathing and respiratory distress.     Orders Placed This Encounter  Procedures   Ambulatory referral to Neurology    Meds ordered this encounter  Medications   levETIRAcetam  (KEPPRA ) 500 MG tablet    Sig: Take 1 tablet (500 mg total) by mouth 2 (two) times daily.    Dispense:  180 tablet    Refill:  4    Return in about 1 year (around 09/19/2025).    Pastor Falling, MD 09/19/2024, 10:43 AM  [facility] [address] [place], [place] [address] [phone]

## 2024-09-21 LAB — HEPATITIS B SURFACE ANTIGEN: Hepatitis B Surface Ag: NONREACTIVE

## 2024-09-21 LAB — ANTI-NUCLEAR AB-TITER (ANA TITER)
ANA TITER: 1:80 {titer} — ABNORMAL HIGH
ANA Titer 1: 1:40 {titer} — ABNORMAL HIGH

## 2024-09-21 LAB — HEPATITIS A ANTIBODY, TOTAL: Hepatitis A AB,Total: NONREACTIVE

## 2024-09-21 LAB — MITOCHONDRIAL ANTIBODIES: Mitochondrial M2 Ab, IgG: <=20 U (ref <=20.0)

## 2024-09-21 LAB — HEPATITIS C ANTIBODY: Hepatitis C Ab: NONREACTIVE

## 2024-09-21 LAB — ANA: Anti Nuclear Antibody (ANA): POSITIVE — AB

## 2024-09-21 LAB — TISSUE TRANSGLUTAMINASE, IGA: (tTG) Ab, IgA: <1 U/mL

## 2024-09-21 LAB — ANTI-SMOOTH MUSCLE ANTIBODY, IGG: Actin (Smooth Muscle) Antibody (IGG): <20 U (ref <20)

## 2024-09-21 LAB — IGG: IgG (Immunoglobin G), Serum: 879 mg/dL (ref 600–1540)

## 2024-09-21 LAB — HEPATITIS B SURFACE ANTIBODY,QUALITATIVE: Hep B S Ab: REACTIVE — AB

## 2024-09-21 LAB — IGA: Immunoglobulin A: 315 mg/dL (ref 70–320)

## 2024-09-22 ENCOUNTER — Ambulatory Visit (HOSPITAL_COMMUNITY)
Admission: RE | Admit: 2024-09-22 | Discharge: 2024-09-22 | Disposition: A | Discharge status: Home / Self Care | Source: Ambulatory Visit | Attending: Physician Assistant | Admitting: Physician Assistant

## 2024-09-22 DIAGNOSIS — K7581 Nonalcoholic steatohepatitis (NASH): Secondary | ICD-10-CM | POA: Diagnosis not present

## 2024-09-22 DIAGNOSIS — R7989 Other specified abnormal findings of blood chemistry: Secondary | ICD-10-CM | POA: Diagnosis not present

## 2024-09-28 ENCOUNTER — Ambulatory Visit: Payer: BC Managed Care – PPO | Admitting: Neurology

## 2024-10-10 ENCOUNTER — Institutional Professional Consult (permissible substitution): Admitting: Neurology

## 2024-10-11 DIAGNOSIS — H10413 Chronic giant papillary conjunctivitis, bilateral: Secondary | ICD-10-CM | POA: Diagnosis not present

## 2024-10-24 DIAGNOSIS — G5601 Carpal tunnel syndrome, right upper limb: Secondary | ICD-10-CM | POA: Diagnosis not present

## 2024-10-24 DIAGNOSIS — M19131 Post-traumatic osteoarthritis, right wrist: Secondary | ICD-10-CM | POA: Diagnosis not present

## 2024-10-25 ENCOUNTER — Encounter: Payer: Self-pay | Admitting: Neurology

## 2024-10-25 ENCOUNTER — Ambulatory Visit (INDEPENDENT_AMBULATORY_CARE_PROVIDER_SITE_OTHER): Admitting: Neurology

## 2024-10-25 VITALS — BP 102/63 | HR 80 | Ht 61.0 in | Wt 141.0 lb

## 2024-10-25 DIAGNOSIS — Z9884 Bariatric surgery status: Secondary | ICD-10-CM

## 2024-10-25 DIAGNOSIS — G4733 Obstructive sleep apnea (adult) (pediatric): Secondary | ICD-10-CM

## 2024-10-25 DIAGNOSIS — R0683 Snoring: Secondary | ICD-10-CM

## 2024-10-25 DIAGNOSIS — R519 Headache, unspecified: Secondary | ICD-10-CM

## 2024-10-25 DIAGNOSIS — G40909 Epilepsy, unspecified, not intractable, without status epilepticus: Secondary | ICD-10-CM | POA: Diagnosis not present

## 2024-10-25 DIAGNOSIS — R351 Nocturia: Secondary | ICD-10-CM

## 2024-10-25 DIAGNOSIS — R5383 Other fatigue: Secondary | ICD-10-CM

## 2024-10-25 NOTE — Patient Instructions (Addendum)
 Thank you for choosing [facility] for your sleep related care! It was nice to meet you today!   Here is what we discussed today:   Please remember that you are not supposed to drive for 6 months after a seizure.  Please refer to your patient instructions from Dr. Janean visit recently for further information.  You can also reach out to Dr. Gregg via MyChart message or phone call for further instructions or clarification if need be.   2.  Based on your symptoms and your exam I believe you are at risk for obstructive sleep apnea (aka OSA). We should proceed with a sleep study to determine whether you do or do not have OSA and how severe it is. Even, if you have mild OSA, I may want you to consider treatment with CPAP, as treatment of even borderline or mild sleep apnea can result and improvement of symptoms such as sleep disruption, daytime sleepiness, nighttime bathroom breaks, restless leg symptoms, improvement of headache syndromes, even improved mood disorder.   3. As explained, an attended sleep study (meaning you get to stay overnight in the sleep lab), lets us  monitor sleep-related behaviors such as sleep talking and leg movements in sleep, in addition to monitoring for sleep apnea.  A home sleep test is a screening tool for sleep apnea diagnosis only, but unfortunately, does not help with any other sleep-related diagnoses.  4. Please remember, the long-term risks and ramifications of untreated moderate to severe obstructive sleep apnea may include (but are not limited to): increased risk for cardiovascular disease, including congestive heart failure, stroke, difficult to control hypertension, treatment resistant obesity, arrhythmias, especially irregular heartbeat commonly known as A. Fib. (atrial fibrillation); even type 2 diabetes has been linked to untreated OSA.   5. Other correlations that untreated obstructive sleep apnea include macular edema which is swelling of the  retina in the eyes, droopy eyelid syndrome, and elevated hemoglobin and hematocrit levels (often referred to as polycythemia).  6. Sleep apnea can cause disruption of sleep and sleep deprivation in most cases, which, in turn, can cause recurrent headaches, problems with memory, mood, concentration, focus, and vigilance. Most people with untreated sleep apnea report excessive daytime sleepiness, which can affect their ability to drive. Please do not drive or use heavy equipment or machinery, if you feel sleepy! Patients with sleep apnea can also develop difficulty initiating and maintaining sleep (aka insomnia).   7. Having sleep apnea may increase your risk for other sleep disorders, including involuntary behaviors sleep such as sleep terrors, sleep talking, sleepwalking.  Having sleep apnea can also increase your risk for restless leg syndrome and leg movements at night.   8. Please note that untreated obstructive sleep apnea may carry additional perioperative morbidity. Patients with significant obstructive sleep apnea (typically, in the moderate to severe degree) should receive, if possible, perioperative PAP (positive airway pressure) therapy and the surgeons and particularly the anesthesiologists should be informed of the diagnosis and the severity of the sleep disordered breathing.   9. We will call you or email you through MyChart with regards to your test results and plan a follow-up in sleep clinic accordingly. Most likely, you will hear from one of our nurses.   10. Our sleep lab administrative assistant will call you to schedule your sleep study and give you further instructions, regarding the check in process for the sleep study, arrival time, what to bring, when you can expect to leave after the study, etc., and to  answer any other logistical questions you may have. If you don't hear back from her by about 2 weeks from now, please feel free to call her direct line at [phone] or you can  call our general clinic number, or email us  through My Chart.

## 2024-10-25 NOTE — Progress Notes (Addendum)
 Subjective:    Patient ID: Emily Graham is a 63 y.o. female.  HPI    True Mar, MD, PhD [facility] [address] [address] [place], [place] [address]  Dear Pastor,   I saw your patient, Emily Graham, upon your kind request in my sleep clinic today for initial consultation of her sleep disorder, in particular, concern for underlying obstructive sleep apnea.  The patient is unaccompanied today.  As you know, [name] is a 63 year old female with an underlying medical history of seizure disorder (with last seizure reported in August 2025), anemia, arthritis, asthma, reflux disease, hypertension, hypoglycemia, prediabetes, ADD, and mildly overweight state, with status post gastric bypass surgery, and status post multiple surgeries including right wrist fusion, knee replacement, shoulder surgery, lumbar fusion, who reports snoring and excessive daytime somnolence.  Her Epworth sleepiness score is 13/24, fatigue severity score is 53/63.  I reviewed your office note from 09/19/2024.  She was previously diagnosed with obstructive sleep apnea several years ago but could not tolerate CPAP therapy at the time.  Prior sleep study results are not available for my review today.  She achieved a significant amount of weight loss after her gastric bypass surgery.  Of note, the patient does endorse driving herself to this appointment and was reminded to not drive until she is 6 months free of seizures as she did have a seizure in August 2025.  She is reminded that this is a [place] statute.  She is furthermore advised to go back to her patient instructions from her recent visit with you and encouraged to get in touch with you for any further instructions or advise if need be.  She lives with her husband.  She goes to bed generally around 10:00 and rise time is around 5 or 6 AM.  She does not work.  She drinks limited caffeine in the form of coffee, 1 pop and occasional  soda.  She is a non-smoker and drinks alcohol very occasionally, about once a month.  She has had occasional morning headaches.  She has nocturia about once or twice per average night.  She has Most of her weight off after her gastric bypass surgery some 25 years ago.  She has a TV in her bedroom and it tends to be on at night and she typically falls asleep with the TV on and her husband may turn it off after he comes in.  Her Past Medical History Is Significant For: Past Medical History:  Diagnosis Date   ADD (attention deficit disorder) 07/19/2013   Anal fissure    Anemia    Arthritis    Asthma    Cholecystitis, acute with cholelithiasis 07/19/2013   Chronic left shoulder pain 07/19/2013   Colon polyps    Complication of anesthesia    pt aspirated during leg lift surgery.   Dyspnea    Gallstones    GERD (gastroesophageal reflux disease) 07/19/2013   Goiter    HTN (hypertension)    Hypoglycemia    Hypoglycemia 07/19/2013   Murmur    Neuromuscular disorder (HCC)    Pneumonia    Pre-diabetes    Seizure disorder, primary generalized (HCC) 07/19/2013   May be related to hypoglycemia   Seizures (HCC) last 01/08/18   2013-grand mal due to hypoglycemia   Sleep apnea    mild-not using CPAP per pt    Her Past Surgical History Is Significant For: Past Surgical History:  Procedure Laterality Date  ABDOMINAL EXPOSURE N/A 03/15/2024   Procedure: ABDOMINAL EXPOSURE;  Surgeon: Gretta Lonni PARAS, MD;  Location: [facility];  Service: Vascular;  Laterality: N/A;   ABDOMINAL HYSTERECTOMY     ANTERIOR LUMBAR FUSION N/A 03/15/2024   Procedure: ANTERIOR LUMBAR FUSION 1 LEVEL;  Surgeon: Beuford Anes, MD;  Location: [facility] OR;  Service: Orthopedics;  Laterality: N/A;  ANTERIOR LUMBAR INTERBODY FUSION LUMBAR 4- LUMBAR 5 WITH INSTRUMENTATION AND ALLOGRAFT WITH POSTERIOR SPINAL FUSION WITH INSTRUMENTATION AND ALLOGRAFT   BREAST ENHANCEMENT SURGERY     CHOLECYSTECTOMY N/A 07/20/2013   Procedure:  LAPAROSCOPIC CHOLECYSTECTOMY WITH INTRAOPERATIVE CHOLANGIOGRAM;  Surgeon: Alm VEAR Angle, MD;  Location: [facility] ORS;  Service: General;  Laterality: N/A;   GASTRIC BYPASS  2000   JOINT REPLACEMENT Bilateral    knees   leg lift Bilateral 2008   had aspiration in lungs, fluid was drained per pt/this was done at outpatient center/[facility]   NOSE SURGERY     Stomach tuck     TOTAL SHOULDER REPLACEMENT Right    WRIST FUSION Right    with carpal tunnel repair    Her Family History Is Significant For: Family History  Problem Relation Age of Onset   Stroke Mother    Skin cancer Mother    Hypertension Mother    Hyperlipidemia Mother    Kidney disease Mother    Coronary artery disease Father        MI at age 89   Heart disease Father    Hypertension Father    Diabetes Father    Hypertension Brother    Hyperlipidemia Brother    Uterine cancer Maternal Grandmother    Arthritis Daughter    Thyroid  disease Daughter    Obesity Son    Breast cancer Maternal Aunt    Cancer Maternal Aunt    Breast cancer Paternal Aunt    Breast cancer Paternal Uncle    Cancer Cousin    Colon cancer Neg Hx     Her Social History Is Significant For: Social History   Socioeconomic History   Marital status: Married    Spouse name: Not on file   Number of children: 2   Years of education: Not on file   Highest education level: 12th grade  Occupational History    Employer: UNEMPLOYED  Tobacco Use   Smoking status: Never   Smokeless tobacco: Never  Vaping Use   Vaping status: Never Used  Substance and Sexual Activity   Alcohol use: Yes    Alcohol/week: 2.0 standard drinks of alcohol    Types: 1 Glasses of wine, 1 Cans of beer per week    Comment: occ   Drug use: No   Sexual activity: Yes  Other Topics Concern   Not on file  Social History Narrative   Right handed   Lives with husband   Caffeine - 1 cup daily   Social Drivers of Health   Financial Resource Strain: Low Risk  (05/29/2024)    Overall Financial Resource Strain (CARDIA)    Difficulty of Paying Living Expenses: Not very hard  Food Insecurity: No Food Insecurity (05/29/2024)   Hunger Vital Sign    Worried About Running Out of Food in the Last Year: Never true    Ran Out of Food in the Last Year: Never true  Transportation Needs: No Transportation Needs (05/29/2024)   PRAPARE - Administrator, Civil Service (Medical): No    Lack of Transportation (Non-Medical): No  Physical Activity: Inactive (05/29/2024)  Exercise Vital Sign    Days of Exercise per Week: 0 days    Minutes of Exercise per Session: Not on file  Stress: No Stress Concern Present (05/29/2024)   [employer] of Occupational Health - Occupational Stress Questionnaire    Feeling of Stress: Only a little  Social Connections: Socially Integrated (05/29/2024)   Social Connection and Isolation Panel    Frequency of Communication with Friends and Family: More than three times a week    Frequency of Social Gatherings with Friends and Family: More than three times a week    Attends Religious Services: More than 4 times per year    Active Member of [employer] or Organizations: Yes    Attends Engineer, Structural: More than 4 times per year    Marital Status: Married    Her Allergies Are:  Allergies  Allergen Reactions   Latex Rash  :   Her Current Medications Are:  Outpatient Encounter Medications as of 10/25/2024  Medication Sig   acetaminophen  (TYLENOL ) 650 MG CR tablet Take 1,300 mg by mouth every 8 (eight) hours as needed for pain.   albuterol  (VENTOLIN  HFA) 108 (90 Base) MCG/ACT inhaler Inhale 1-2 puffs into the lungs every 6 (six) hours as needed for wheezing or shortness of breath.   APPLE CIDER VINEGAR PO Take 2 tablets by mouth daily.   aspirin EC 81 MG tablet Take 81 mg by mouth daily. Swallow whole.   b complex vitamins tablet Take 1 tablet by mouth daily.   Brimonidine  Tartrate (LUMIFY ) 0.025 % SOLN Place 1 drop into both  eyes daily as needed (redness/irritation).   EPINEPHrine  0.3 mg/0.3 mL IJ SOAJ injection Inject 0.3 mg into the muscle as needed for anaphylaxis.   gabapentin  (NEURONTIN ) 600 MG tablet Take 1 tablet (600 mg total) by mouth at bedtime.   IRON , FERROUS SULFATE , PO Take 27 mg by mouth daily.   levETIRAcetam  (KEPPRA ) 500 MG tablet Take 1 tablet (500 mg total) by mouth 2 (two) times daily.   loratadine  (CLARITIN ) 10 MG tablet Take 10 mg by mouth daily.   Midazolam  (NAYZILAM ) 5 MG/0.1ML SOLN Place 5 mg into the nose as needed (for seizure clusters or seizure lasting more than 2 minutes).   Milk Thistle 1000 MG CAPS Take 1,000 mg by mouth daily.   Multiple Vitamin (MULTIVITAMIN) tablet Take 1 tablet by mouth daily.    Multiple Vitamins-Minerals (HAIR SKIN AND NAILS FORMULA PO) Take 1 tablet by mouth daily.   Omega-3 Fatty Acids (FISH OIL) 1000 MG CAPS Take 1 capsule by mouth daily.   omeprazole  (PRILOSEC) 20 MG capsule Take 1 capsule (20 mg total) by mouth daily.   psyllium (REGULOID) 0.52 g capsule Take 0.52 g by mouth daily.   TURMERIC PO Take 1,500 mg by mouth daily.   No facility-administered encounter medications on file as of 10/25/2024.  :   Review of Systems:  Out of a complete 14 point review of systems, all are reviewed and negative with the exception of these symptoms as listed below:  Review of Systems  Objective:  Neurological Exam  Physical Exam Physical Examination:   Vitals:   10/25/24 1123  BP: 102/63  Pulse: 80    General Examination: The patient is a very pleasant 63 y.o. female in no acute distress. She appears well-developed and well-nourished and well groomed.   HEENT: Normocephalic, atraumatic, pupils are equal, round and reactive to light, extraocular tracking is good without limitation to gaze excursion or nystagmus  noted. No photophobia.  Hearing is grossly intact.  Face is symmetric with normal facial animation. Speech is clear without dysarthria. There is  no hypophonia. There is no lip, neck/head, jaw or voice tremor. Neck is supple with full range of passive and active motion. There are no carotid bruits on auscultation.  Airway/Oropharynx exam reveals:  mouth dryness, good dental hygiene and mild airway crowding, due to small airway entry and redundant soft palate, Mallampati class I, tonsils on the smaller side, mild to moderate overbite noted.  Neck circumference 12 1/4 inches.  Tongue protrudes centrally and palate elevates symmetrically.  Chest: Clear to auscultation without wheezing, rhonchi or crackles noted.  Heart: S1+S2+0, regular and normal without murmurs, rubs or gallops noted.   Abdomen: Soft, non-tender and non-distended.  Extremities: There is no pitting edema in the distal lower extremities bilaterally.   Skin: Warm and dry without trophic changes noted.   Musculoskeletal: exam reveals no obvious joint deformities, right wrist in a Velcro brace..   Neurologically:  Mental status: The patient is awake, alert and oriented in all 4 spheres. Her immediate and remote memory, attention, language skills and fund of knowledge are appropriate. There is no evidence of aphasia, agnosia, apraxia or anomia. Speech is clear with normal prosody and enunciation. Thought process is linear. Mood is normal and affect is normal.  Cranial nerves II - XII are as described above under HEENT exam.  Motor exam: Normal bulk, moving all 4 extremities without restriction, with the exception of right wrist in a brace, no obvious action or resting tremor.  Fine motor skills and coordination: Intact grossly.  Cerebellar testing: No dysmetria or intention tremor. There is no truncal or gait ataxia.  Sensory exam: intact to light touch in the upper and lower extremities.  Gait, station and balance: She stands easily. No veering to one side is noted. No leaning to one side is noted. Posture is age-appropriate and stance is narrow based. Gait shows normal stride  length and normal pace. No problems turning are noted.   Assessment and plan:  In summary, Lanay Zinda is a 63 year old female with an underlying medical history of seizure disorder (with last seizure reported in August 2025), anemia, arthritis, asthma, reflux disease, hypertension, hypoglycemia, prediabetes, ADD, and mildly overweight state, with status post gastric bypass surgery, and status post multiple surgeries including right wrist fusion, knee replacement, shoulder surgery, lumbar fusion, who presents for evaluation of her prior diagnosis of obstructive sleep apnea.  She was diagnosed with OSA several years ago, this was prior to her bariatric surgery, she could not tolerate PAP therapy at the time and has achieved a significant amount of weight loss since then.  Nevertheless, she endorses snoring, sleep disruption, and daytime fatigue/tiredness. A laboratory attended sleep study is typically considered gold standard for evaluation of sleep disordered breathing.   I had a long chat with the patient about my findings and the diagnosis of sleep apnea, particularly OSA, its prognosis and treatment options. We talked about medical/conservative treatments, surgical interventions and non-pharmacological approaches for symptom control. I explained, in particular, the risks and ramifications of untreated moderate to severe OSA, especially with respect to developing cardiovascular disease down the road, including congestive heart failure (CHF), difficult to treat hypertension, cardiac arrhythmias (particularly A-fib), neurovascular complications including TIA, stroke and dementia. Even type 2 diabetes has, in part, been linked to untreated OSA. Symptoms of untreated OSA may include (but may not be limited to) daytime sleepiness, nocturia (i.e. frequent nighttime  urination), memory problems, mood irritability and suboptimally controlled or worsening mood disorder such as depression and/or anxiety, lack of  energy, lack of motivation, physical discomfort, as well as recurrent headaches, especially morning or nocturnal headaches. We talked about the importance of maintaining a healthy lifestyle and striving for healthy weight. In addition, we talked about the importance of striving for and maintaining good sleep hygiene.  She is furthermore reminded not to drive since she had a seizure in August 2025.  She is advised that she should not be driving for 6 months after a seizure event.  She demonstrated understanding.  I recommended a sleep study at this time. I outlined the differences between a laboratory attended sleep study which is considered more comprehensive and accurate over the option of a home sleep test (HST); the latter may lead to underestimation of sleep disordered breathing in some instances and does not help with diagnosing upper airway resistance syndrome and is not accurate enough to diagnose primary central sleep apnea typically. I outlined possible surgical and non-surgical treatment options of OSA, including the use of a positive airway pressure (PAP) device (i.e. CPAP, AutoPAP/APAP or BiPAP in certain circumstances), a custom-made dental device (aka oral appliance, which would require a referral to a specialist dentist or orthodontist typically, and is generally speaking not considered for patients with full dentures or edentulous state), upper airway surgical options, such as traditional UPPP (which is not considered a first-line treatment) or the Inspire device (hypoglossal nerve stimulator, which would involve a referral for consultation with an ENT surgeon, after careful selection, following inclusion criteria - also not first-line treatment). I explained the PAP treatment option to the patient in detail, as this is generally considered first-line treatment.  The patient indicated that she would be willing to try PAP therapy, if the need arises.   We will pick up our discussion about the  next steps and treatment options after testing.  We will keep her posted as to the test results by phone call and/or MyChart messaging where possible.  We will plan to follow-up in sleep clinic accordingly as well.  I answered all her questions today and the patient was in agreement.   I encouraged her to call with any interim questions, concerns, problems or updates or email us  through MyChart.  Generally speaking, sleep test authorizations may take up to 2 weeks, sometimes less, sometimes longer, the patient is encouraged to get in touch with us  if they do not hear back from the sleep lab staff directly within the next 2 weeks.  Thank you very much for allowing me to participate in the care of this nice patient. If I can be of any further assistance to you please do not hesitate to talk to me.  Sincerely,   True Mar, MD, PhD

## 2024-10-27 ENCOUNTER — Encounter: Payer: Self-pay | Admitting: Gastroenterology

## 2024-10-27 ENCOUNTER — Ambulatory Visit: Admitting: Gastroenterology

## 2024-10-27 VITALS — BP 116/77 | HR 78 | Temp 97.9°F | Resp 17 | Ht 60.0 in | Wt 141.0 lb

## 2024-10-27 DIAGNOSIS — Z980 Intestinal bypass and anastomosis status: Secondary | ICD-10-CM | POA: Diagnosis not present

## 2024-10-27 DIAGNOSIS — Z9884 Bariatric surgery status: Secondary | ICD-10-CM

## 2024-10-27 DIAGNOSIS — K219 Gastro-esophageal reflux disease without esophagitis: Secondary | ICD-10-CM

## 2024-10-27 MED ORDER — SODIUM CHLORIDE 0.9 % IV SOLN: 500.0000 mL | Freq: Once | INTRAVENOUS | Status: DC

## 2024-10-27 NOTE — Progress Notes (Signed)
 History and Physical:  This patient presents for endoscopic testing for: Encounter Diagnosis  Name Primary?   Gastroesophageal reflux disease, unspecified whether esophagitis present Yes    63 year old woman here today for endoscopic evaluation of persistent GERD symptoms.  Clinical details outlined in 09/18/2024 APP office note with no significant clinical changes since then.  Prior gastric bypass  Patient is otherwise without complaints or active issues today.   Past Medical History: Past Medical History:  Diagnosis Date   ADD (attention deficit disorder) 07/19/2013   Anal fissure    Anemia    Arthritis    Asthma    Cholecystitis, acute with cholelithiasis 07/19/2013   Chronic left shoulder pain 07/19/2013   Colon polyps    Complication of anesthesia    pt aspirated during leg lift surgery.   Dyspnea    Gallstones    GERD (gastroesophageal reflux disease) 07/19/2013   Goiter    HTN (hypertension)    Hypoglycemia    Hypoglycemia 07/19/2013   Murmur    Neuromuscular disorder (HCC)    Pneumonia    Pre-diabetes    Seizure disorder, primary generalized (HCC) 07/19/2013   May be related to hypoglycemia   Seizures (HCC) last 01/08/18   2013-grand mal due to hypoglycemia   Sleep apnea    mild-not using CPAP per pt     Past Surgical History: Past Surgical History:  Procedure Laterality Date   ABDOMINAL EXPOSURE N/A 03/15/2024   Procedure: ABDOMINAL EXPOSURE;  Surgeon: Gretta Lonni PARAS, MD;  Location: [facility] OR;  Service: Vascular;  Laterality: N/A;   ABDOMINAL HYSTERECTOMY     ANTERIOR LUMBAR FUSION N/A 03/15/2024   Procedure: ANTERIOR LUMBAR FUSION 1 LEVEL;  Surgeon: Beuford Anes, MD;  Location: [facility] OR;  Service: Orthopedics;  Laterality: N/A;  ANTERIOR LUMBAR INTERBODY FUSION LUMBAR 4- LUMBAR 5 WITH INSTRUMENTATION AND ALLOGRAFT WITH POSTERIOR SPINAL FUSION WITH INSTRUMENTATION AND ALLOGRAFT   BREAST ENHANCEMENT SURGERY     CHOLECYSTECTOMY N/A 07/20/2013    Procedure: LAPAROSCOPIC CHOLECYSTECTOMY WITH INTRAOPERATIVE CHOLANGIOGRAM;  Surgeon: Alm VEAR Angle, MD;  Location: [facility] ORS;  Service: General;  Laterality: N/A;   GASTRIC BYPASS  2000   JOINT REPLACEMENT Bilateral    knees   leg lift Bilateral 2008   had aspiration in lungs, fluid was drained per pt/this was done at outpatient center/[facility]   NOSE SURGERY     Stomach tuck     TOTAL SHOULDER REPLACEMENT Right    WRIST FUSION Right    with carpal tunnel repair    Allergies: Allergies  Allergen Reactions   Latex Rash    Outpatient Meds: Current Outpatient Medications  Medication Sig Dispense Refill   acetaminophen  (TYLENOL ) 650 MG CR tablet Take 1,300 mg by mouth every 8 (eight) hours as needed for pain.     APPLE CIDER VINEGAR PO Take 2 tablets by mouth daily.     aspirin EC 81 MG tablet Take 81 mg by mouth daily. Swallow whole.     b complex vitamins tablet Take 1 tablet by mouth daily.     Brimonidine  Tartrate (LUMIFY ) 0.025 % SOLN Place 1 drop into both eyes daily as needed (redness/irritation).     gabapentin  (NEURONTIN ) 600 MG tablet Take 1 tablet (600 mg total) by mouth at bedtime. 90 tablet 1   IRON , FERROUS SULFATE , PO Take 27 mg by mouth daily.     levETIRAcetam  (KEPPRA ) 500 MG tablet Take 1 tablet (500 mg total) by mouth 2 (two) times daily. 180 tablet 4  loratadine  (CLARITIN ) 10 MG tablet Take 10 mg by mouth daily.     Milk Thistle 1000 MG CAPS Take 1,000 mg by mouth daily.     Multiple Vitamin (MULTIVITAMIN) tablet Take 1 tablet by mouth daily.      Multiple Vitamins-Minerals (HAIR SKIN AND NAILS FORMULA PO) Take 1 tablet by mouth daily.     Omega-3 Fatty Acids (FISH OIL) 1000 MG CAPS Take 1 capsule by mouth daily.     omeprazole  (PRILOSEC) 20 MG capsule Take 1 capsule (20 mg total) by mouth daily. 30 capsule 2   psyllium (REGULOID) 0.52 g capsule Take 0.52 g by mouth daily.     TURMERIC PO Take 1,500 mg by mouth daily.     albuterol  (VENTOLIN  HFA) 108 (90 Base) MCG/ACT  inhaler Inhale 1-2 puffs into the lungs every 6 (six) hours as needed for wheezing or shortness of breath. 18 g 0   EPINEPHrine  0.3 mg/0.3 mL IJ SOAJ injection Inject 0.3 mg into the muscle as needed for anaphylaxis. 1 each 0   Midazolam  (NAYZILAM ) 5 MG/0.1ML SOLN Place 5 mg into the nose as needed (for seizure clusters or seizure lasting more than 2 minutes). 2 each 0   Current Facility-Administered Medications  Medication Dose Route Frequency Provider Last Rate Last Admin   0.9 %  sodium chloride  infusion  500 mL Intravenous Once Danis, Aurelio Mccamy L III, MD          ___________________________________________________________________ Objective   Exam:  BP 111/62   Pulse 76   Temp 97.9 F (36.6 C)   Ht 5' (1.524 m)   Wt 141 lb (64 kg)   SpO2 99%   BMI 27.54 kg/m   CV: regular , S1/S2 Resp: clear to auscultation bilaterally, normal RR and effort noted GI: soft, no tenderness, with active bowel sounds.   Assessment: Encounter Diagnosis  Name Primary?   Gastroesophageal reflux disease, unspecified whether esophagitis present Yes     Plan:  EGD  The benefits and risks of the planned procedure(s) were described in detail with the patient or (when appropriate) their health care proxy.  Risks were outlined as including, but not limited to, bleeding, infection, perforation, adverse medication reaction leading to cardiac or pulmonary decompensation, pancreatitis (if ERCP).  The limitation of incomplete mucosal visualization was also discussed.  No guarantees or warranties were given.  The patient was provided an opportunity to ask questions and all were answered. The patient agreed with the plan.   The patient is appropriate for an endoscopic procedure in the ambulatory setting.   - Victory Brand, MD

## 2024-10-27 NOTE — Patient Instructions (Addendum)
 Resume previous diet Continue present medications Keep head of bed elevated 45 degrees  YOU HAD AN ENDOSCOPIC PROCEDURE TODAY AT THE [facility]:   Refer to the procedure report that was given to you for any specific questions about what was found during the examination.  If the procedure report does not answer your questions, please call your gastroenterologist to clarify.  If you requested that your care partner not be given the details of your procedure findings, then the procedure report has been included in a sealed envelope for you to review at your convenience later.  YOU SHOULD EXPECT: Some feelings of bloating in the abdomen. Passage of more gas than usual.  Walking can help get rid of the air that was put into your GI tract during the procedure and reduce the bloating. If you had a lower endoscopy (such as a colonoscopy or flexible sigmoidoscopy) you may notice spotting of blood in your stool or on the toilet paper. If you underwent a bowel prep for your procedure, you may not have a normal bowel movement for a few days.  Please Note:  You might notice some irritation and congestion in your nose or some drainage.  This is from the oxygen used during your procedure.  There is no need for concern and it should clear up in a day or so.  SYMPTOMS TO REPORT IMMEDIATELY:  Following upper endoscopy (EGD)  Vomiting of blood or coffee ground material  New chest pain or pain under the shoulder blades  Painful or persistently difficult swallowing  New shortness of breath  Fever of 100F or higher  Black, tarry-looking stools For urgent or emergent issues, a gastroenterologist can be reached at any hour by calling [phone]. Do not use MyChart messaging for urgent concerns.   DIET:  We do recommend a small meal at first, but then you may proceed to your regular diet.  Drink plenty of fluids but you should avoid alcoholic beverages for 24 hours.  ACTIVITY:  You should plan to  take it easy for the rest of today and you should NOT DRIVE or use heavy machinery until tomorrow (because of the sedation medicines used during the test).    FOLLOW UP: Our staff will call the number listed on your records the next business day following your procedure.  We will call around 7:15- 8:00 am to check on you and address any questions or concerns that you may have regarding the information given to you following your procedure. If we do not reach you, we will leave a message.      SIGNATURES/CONFIDENTIALITY: You and/or your care partner have signed paperwork which will be entered into your electronic medical record.  These signatures attest to the fact that that the information above on your After Visit Summary has been reviewed and is understood.  Full responsibility of the confidentiality of this discharge information lies with you and/or your care-partner.

## 2024-10-27 NOTE — Progress Notes (Signed)
 1335 Robinul 0.1 mg IV given due large amount of secretions upon assessment.  MD made aware, vss

## 2024-10-27 NOTE — Progress Notes (Signed)
 Pt's states no medical or surgical changes since previsit or office visit.

## 2024-10-27 NOTE — Op Note (Signed)
 [facility] Patient Name: Emily Graham Procedure Date: 10/27/2024 1:29 PM MRN: [id number] Endoscopist: Victory L. Legrand , MD, [id number] Age: 63 Referring MD:  Date of Birth: 1961/07/21 Gender: Female Account #: [id number] Procedure:                Upper GI endoscopy Indications:              Esophageal reflux symptoms that persist despite                            appropriate therapy Medicines:                Monitored Anesthesia Care Procedure:                Pre-Anesthesia Assessment:                           - Prior to the procedure, a History and Physical                            was performed, and patient medications and                            allergies were reviewed. The patient's tolerance of                            previous anesthesia was also reviewed. The risks                            and benefits of the procedure and the sedation                            options and risks were discussed with the patient.                            All questions were answered, and informed consent                            was obtained. Prior Anticoagulants: The patient has                            taken no anticoagulant or antiplatelet agents. ASA                            Grade Assessment: III - A patient with severe                            systemic disease. After reviewing the risks and                            benefits, the patient was deemed in satisfactory                            condition to undergo the procedure.  After obtaining informed consent, the endoscope was                            passed under direct vision. Throughout the                            procedure, the patient's blood pressure, pulse, and                            oxygen saturations were monitored continuously. The                            Olympus Scope SN [id number] was introduced through the                            mouth, and advanced to the  afferent and efferent                            jejunal loops. The upper GI endoscopy was                            accomplished without difficulty. The patient                            tolerated the procedure well. Scope In: Scope Out: Findings:                 The larynx was normal.                           The esophagus was normal.                           Evidence of a gastric bypass was found. It was an                            atypical gastric bypass anatomy (reportedly done                            many years ago) A tubular gastric pouch with a 10                            cm length from the GE junction to the gastrojejunal                            anastomosis was found. Gastric mucosa was                            bile-stained. The gastrojejunal anastomosis was                            characterized by bile staining and mildly friable                            mucosa. This  was easily traversed. See photos for                            details - there was a double lumen anastomosis                            (such as would be seen in a Billroth II). Both                            loops of small bowel could be intubated to for at                            least 20 to 30 cm.                           The examined jejunum was normal. Complications:            No immediate complications. Estimated Blood Loss:     Estimated blood loss: none. Impression:               - Normal larynx.                           - Normal esophagus.                           - Gastric bypass with a pouch 10 cm in length.                            Gastrojejunal anastomosis characterized by friable                            mucosa.                           - Normal examined jejunum.                           - No specimens collected. Recommendation:           - Patient has a contact number available for                            emergencies. The signs and symptoms of potential                             delayed complications were discussed with the                            patient. Return to normal activities tomorrow.                            Written discharge instructions were provided to the                            patient.                           -  Resume previous diet.                           - Continue present medications.                           It should be noted that acid suppression medicine                            such as H2 blocker or PPI will likely be of limited                            benefit in this situation given that the acid                            producing portion of the stomach has most likely                            been bypassed.                           This patient reports benefit from taking such                            medicine, but it would be reasonable to try a                            period of time off that medicine to see if symptoms                            change. Will leave to patient's discretion.                           Patient also had significant improvement keeping                            her head of bed elevated, something which is likely                            to be much more helpful than medications to manage                            this issue, particularly given the postsurgical                            anatomy described above.                           Keeping head of bed elevated indefinitely for the                            bile reflux this patient is reporting is crucial to  decrease the chance of long-term respiratory                            problems that could happen from nocturnal reflux.                            Should those issues continue to occur despite the                            above measures, it would be reasonable to consult                            with a bariatric surgeon regarding the feasibility                             (but also uncertain long-term effect) of surgical                            revision.                           Return to our office as needed. Ahmaud Duthie L. Legrand, MD 10/27/2024 2:05:02 PM This report has been signed electronically.

## 2024-10-27 NOTE — Progress Notes (Signed)
 Report given to PACU, vss

## 2024-10-30 ENCOUNTER — Telehealth: Payer: Self-pay

## 2024-10-30 NOTE — Telephone Encounter (Signed)
  Follow up Call-     10/27/2024   12:52 PM  Call back number  Post procedure Call Back phone  # [phone]  Permission to leave phone message Yes     Patient questions:  Do you have a fever, pain , or abdominal swelling? No. Pain Score  0 *  Have you tolerated food without any problems? Yes.    Have you been able to return to your normal activities? Yes.    Do you have any questions about your discharge instructions: Diet   No. Medications  No. Follow up visit  No.  Do you have questions or concerns about your Care? No.  Actions: * If pain score is 4 or above: No action needed, pain <4.

## 2024-11-13 ENCOUNTER — Ambulatory Visit
Admission: EM | Admit: 2024-11-13 | Discharge: 2024-11-13 | Disposition: A | Discharge status: Home / Self Care | Attending: Emergency Medicine | Admitting: Emergency Medicine

## 2024-11-13 ENCOUNTER — Encounter: Payer: Self-pay | Admitting: Emergency Medicine

## 2024-11-13 DIAGNOSIS — H9201 Otalgia, right ear: Secondary | ICD-10-CM

## 2024-11-13 MED ORDER — PREDNISONE 20 MG PO TABS: 40.0000 mg | ORAL_TABLET | Freq: Every day | ORAL | 0 refills | Status: AC

## 2024-11-13 NOTE — ED Provider Notes (Signed)
 [name]    [name]: [id number] Arrival date & time: 11/13/24  1447      History   Chief Complaint Chief Complaint  Patient presents with   Otalgia    HPI Nyhla Graham is a 63 y.o. female.   Patient presents to clinic over concern of right ear pain.  Pain started yesterday and was intermittent, woke her up overnight.  Today the pain has gotten worse and is more frequent with stabbing sensation.  Feels the pain into her jaw and back of her neck.  Did go to a pharmacy where they recommended over-the-counter swimmer eardrops which she has tried and feels like that has made the pain worse.  Has not been sick recently.  Has not had any sore throat or cough.  Does have nasal congestion and rhinorrhea chronically, reports this is due to allergies.  Has not had fevers.  Denies drainage from the ear.  The history is provided by the patient and medical records.  Otalgia   Past Medical History:  Diagnosis Date   ADD (attention deficit disorder) 07/19/2013   Anal fissure    Anemia    Arthritis    Asthma    Cholecystitis, acute with cholelithiasis 07/19/2013   Chronic left shoulder pain 07/19/2013   Colon polyps    Complication of anesthesia    pt aspirated during leg lift surgery.   Dyspnea    Gallstones    GERD (gastroesophageal reflux disease) 07/19/2013   Goiter    HTN (hypertension)    Hypoglycemia    Hypoglycemia 07/19/2013   Murmur    Neuromuscular disorder (HCC)    Pneumonia    Pre-diabetes    Seizure disorder, primary generalized (HCC) 07/19/2013   May be related to hypoglycemia   Seizures (HCC) last 01/08/18   2013-grand mal due to hypoglycemia   Sleep apnea    mild-not using CPAP per pt    Patient Active Problem List   Diagnosis Date Noted   Carpal tunnel syndrome of right wrist 09/19/2024   Post-traumatic osteoarthritis, right wrist 09/19/2024   Environmental and seasonal allergies 06/02/2024   Radiculopathy of lumbar region 03/15/2024    Overweight (BMI 25.0-29.9) 03/16/2018   NASH (nonalcoholic steatohepatitis) 05/06/2015   Liver fibrosis    Abnormal liver function tests 01/29/2015   Vitamin D  deficiency 11/27/2014   Other abnormal glucose 11/27/2013   Seizure disorder, primary generalized (HCC) 07/19/2013   GERD (gastroesophageal reflux disease) 07/19/2013   Hyperlipidemia     Past Surgical History:  Procedure Laterality Date   ABDOMINAL EXPOSURE N/A 03/15/2024   Procedure: ABDOMINAL EXPOSURE;  Surgeon: Gretta Lonni PARAS, MD;  Location: [facility] OR;  Service: Vascular;  Laterality: N/A;   ABDOMINAL HYSTERECTOMY     ANTERIOR LUMBAR FUSION N/A 03/15/2024   Procedure: ANTERIOR LUMBAR FUSION 1 LEVEL;  Surgeon: Beuford Anes, MD;  Location: [facility] OR;  Service: Orthopedics;  Laterality: N/A;  ANTERIOR LUMBAR INTERBODY FUSION LUMBAR 4- LUMBAR 5 WITH INSTRUMENTATION AND ALLOGRAFT WITH POSTERIOR SPINAL FUSION WITH INSTRUMENTATION AND ALLOGRAFT   BREAST ENHANCEMENT SURGERY     CHOLECYSTECTOMY N/A 07/20/2013   Procedure: LAPAROSCOPIC CHOLECYSTECTOMY WITH INTRAOPERATIVE CHOLANGIOGRAM;  Surgeon: Alm VEAR Angle, MD;  Location: [facility] ORS;  Service: General;  Laterality: N/A;   GASTRIC BYPASS  2000   JOINT REPLACEMENT Bilateral    knees   leg lift Bilateral 2008   had aspiration in lungs, fluid was drained per pt/this was done at outpatient center/[facility]   NOSE SURGERY  Stomach tuck     TOTAL SHOULDER REPLACEMENT Right    WRIST FUSION Right    with carpal tunnel repair    OB History     Gravida  3   Para  2   Term  2   Preterm      AB  1   Living         SAB  1   IAB      Ectopic      Multiple      Live Births               Home Medications    Prior to Admission medications   Medication Sig Start Date End Date Taking? Authorizing Provider  acetaminophen  (TYLENOL ) 650 MG CR tablet Take 1,300 mg by mouth every 8 (eight) hours as needed for pain.   Yes [provider]  APPLE CIDER VINEGAR PO  Take 2 tablets by mouth daily.   Yes [provider]  aspirin EC 81 MG tablet Take 81 mg by mouth daily. Swallow whole.   Yes [provider]  b complex vitamins tablet Take 1 tablet by mouth daily.   Yes [provider]  Brimonidine  Tartrate (LUMIFY ) 0.025 % SOLN Place 1 drop into both eyes daily as needed (redness/irritation).   Yes [provider]  gabapentin  (NEURONTIN ) 600 MG tablet Take 1 tablet (600 mg total) by mouth at bedtime. 06/02/24  Yes Henson, Vickie L, NP-C  IRON , FERROUS SULFATE , PO Take 27 mg by mouth daily.   Yes [provider]  levETIRAcetam  (KEPPRA ) 500 MG tablet Take 1 tablet (500 mg total) by mouth 2 (two) times daily. 09/19/24  Yes Camara, Amadou, MD  loratadine  (CLARITIN ) 10 MG tablet Take 10 mg by mouth daily.   Yes [provider]  Milk Thistle 1000 MG CAPS Take 1,000 mg by mouth daily.   Yes [provider]  Multiple Vitamins-Minerals (HAIR SKIN AND NAILS FORMULA PO) Take 1 tablet by mouth daily. 08/02/19  Yes [provider]  Omega-3 Fatty Acids (FISH OIL) 1000 MG CAPS Take 1 capsule by mouth daily.   Yes [provider]  omeprazole  (PRILOSEC) 20 MG capsule Take 1 capsule (20 mg total) by mouth daily. 07/26/24  Yes Henson, Vickie L, NP-C  predniSONE  (DELTASONE ) 20 MG tablet Take 2 tablets (40 mg total) by mouth daily with breakfast for 5 days. 11/13/24 11/18/24 Yes Asher Babilonia  N, FNP  psyllium (REGULOID) 0.52 g capsule Take 0.52 g by mouth daily.   Yes [provider]  TURMERIC PO Take 1,500 mg by mouth daily.   Yes [provider]  albuterol  (VENTOLIN  HFA) 108 (90 Base) MCG/ACT inhaler Inhale 1-2 puffs into the lungs every 6 (six) hours as needed for wheezing or shortness of breath. 06/02/24   Henson, Vickie L, NP-C  EPINEPHrine  0.3 mg/0.3 mL IJ SOAJ injection Inject 0.3 mg into the muscle as needed for anaphylaxis. 06/02/24   Henson, Vickie L, NP-C  Midazolam  (NAYZILAM ) 5  MG/0.1ML SOLN Place 5 mg into the nose as needed (for seizure clusters or seizure lasting more than 2 minutes). Patient not taking: Reported on 11/13/2024 09/29/23   Camara, Amadou, MD  Multiple Vitamin (MULTIVITAMIN) tablet Take 1 tablet by mouth daily.     [provider]    Family History Family History  Problem Relation Age of Onset   Stroke Mother    Skin cancer Mother    Hypertension Mother    Hyperlipidemia  Mother    Kidney disease Mother    Coronary artery disease Father        MI at age 72   Heart disease Father    Hypertension Father    Diabetes Father    Hypertension Brother    Hyperlipidemia Brother    Uterine cancer Maternal Grandmother    Arthritis Daughter    Thyroid  disease Daughter    Obesity Son    Breast cancer Maternal Aunt    Cancer Maternal Aunt    Breast cancer Paternal Aunt    Breast cancer Paternal Uncle    Cancer Cousin    Colon cancer Neg Hx     Social History Social History   Tobacco Use   Smoking status: Never   Smokeless tobacco: Never  Vaping Use   Vaping status: Never Used  Substance Use Topics   Alcohol use: Yes    Alcohol/week: 2.0 standard drinks of alcohol    Types: 1 Glasses of wine, 1 Cans of beer per week    Comment: occ   Drug use: No     Allergies   Latex   Review of Systems Review of Systems  Per HPI  Physical Exam Triage Vital Signs ED Triage Vitals  Encounter Vitals Group     BP 11/13/24 1542 123/76     Girls Systolic BP Percentile --      Girls Diastolic BP Percentile --      Boys Systolic BP Percentile --      Boys Diastolic BP Percentile --      Pulse Rate 11/13/24 1542 70     Resp 11/13/24 1542 14     Temp 11/13/24 1542 98.1 F (36.7 C)     Temp Source 11/13/24 1542 Oral     SpO2 11/13/24 1542 96 %     Weight --      Height --      Head Circumference --      Peak Flow --      Pain Score 11/13/24 1543 8     Pain Loc --      Pain Education --      Exclude from Growth Chart --    No  data found.  Updated Vital Signs BP 123/76 (BP Location: Left Arm)   Pulse 70   Temp 98.1 F (36.7 C) (Oral)   Resp 14   SpO2 96%   Visual Acuity Right Eye Distance:   Left Eye Distance:   Bilateral Distance:    Right Eye Near:   Left Eye Near:    Bilateral Near:     Physical Exam Vitals and nursing note reviewed.  Constitutional:      Appearance: Normal appearance.  HENT:     Head: Normocephalic and atraumatic.     Right Ear: External ear normal. Tympanic membrane is erythematous. Tympanic membrane is not bulging.     Left Ear: Tympanic membrane, ear canal and external ear normal.     Nose: Nose normal.     Mouth/Throat:     Mouth: Mucous membranes are moist.  Eyes:     Conjunctiva/sclera: Conjunctivae normal.  Cardiovascular:     Rate and Rhythm: Normal rate.  Pulmonary:     Effort: Pulmonary effort is normal. No respiratory distress.  Skin:    General: Skin is warm and dry.  Neurological:     General: No focal deficit present.     Mental Status: She is alert and oriented to person, place, and time.  Psychiatric:        Mood and Affect: Mood normal.        Behavior: Behavior normal. Behavior is cooperative.      UC Treatments / Results  Labs (all labs ordered are listed, but only abnormal results are displayed) Labs Reviewed - No data to display  EKG   Radiology No results found.  Procedures Procedures (including critical care time)  Medications Ordered in UC Medications - No data to display  Initial Impression / Assessment and Plan / UC Course  I have reviewed the triage vital signs and the nursing notes.  Pertinent labs & imaging results that were available during my care of the patient were reviewed by me and considered in my medical decision making (see chart for details).  Vitals and triage reviewed, patient is hemodynamically stable.  Right ear with erythematous tympanic membrane, without purulent fluid, bulging or effusion.  Without  evidence of acute otitis media.  Will trial steroids to diminish inflammation of the ear, suspect this is causing ear pain.  Pain management discussed.  Plan of care, follow-up care return precautions given, no questions at this time.    Final Clinical Impressions(s) / UC Diagnoses   Final diagnoses:  Otalgia of right ear     Discharge Instructions      Take the prednisone  today and then daily with breakfast for the next 5 days.  This will help with inflammation of the ear.  For any breakthrough pain you can alternate between Tylenol  and ibuprofen every 4-6 hours.  The prednisone  does not interact with your seizure medication.  Seek follow-up care if your ear pain persist despite treatment for reevaluation.    ED Prescriptions     Medication Sig Dispense Auth. Provider   predniSONE  (DELTASONE ) 20 MG tablet Take 2 tablets (40 mg total) by mouth daily with breakfast for 5 days. 10 tablet Dreama, Stepheny Canal  N, FNP      PDMP not reviewed this encounter.   Dreama, Delynn Pursley  N, FNP 11/13/24 [phone]

## 2024-11-13 NOTE — ED Triage Notes (Addendum)
 Pt reports R ear pain that started yesterday. Has become persistent and sharp over the few hrs. No other associated symptoms. Pt reports the R side of her face has become sensitive to touch since the ear pain on the R side got worse. Denies tinnitus or hearing deficits in the ear. Had trouble sleeping last night due to pain. Has not recently cleaned ear or placed any objects in ear. Has applied swimmer's ear drops today with no relief.

## 2024-11-13 NOTE — Discharge Instructions (Signed)
 Take the prednisone  today and then daily with breakfast for the next 5 days.  This will help with inflammation of the ear.  For any breakthrough pain you can alternate between Tylenol  and ibuprofen every 4-6 hours.  The prednisone  does not interact with your seizure medication.  Seek follow-up care if your ear pain persist despite treatment for reevaluation.

## 2024-11-14 ENCOUNTER — Telehealth: Payer: Self-pay | Admitting: Neurology

## 2024-11-14 NOTE — Telephone Encounter (Signed)
 [facility] [employer] pending

## 2024-11-15 ENCOUNTER — Encounter: Payer: Self-pay | Admitting: Emergency Medicine

## 2024-11-15 ENCOUNTER — Ambulatory Visit: Admission: EM | Admit: 2024-11-15 | Discharge: 2024-11-15 | Disposition: A | Discharge status: Home / Self Care

## 2024-11-15 DIAGNOSIS — B029 Zoster without complications: Secondary | ICD-10-CM

## 2024-11-15 MED ORDER — VALACYCLOVIR HCL 1 G PO TABS: 1000.0000 mg | ORAL_TABLET | Freq: Three times a day (TID) | ORAL | 0 refills | Status: AC

## 2024-11-15 NOTE — ED Triage Notes (Signed)
 Pt presents c/o possible shingles x 4 days. Pt states,  I was here Monday with an excruciating ear ache. I told the nurse the top of my head was sensitive like I had sunburn. I also had this scab over my eye that I told her about. Everything is on the right side. I was given prednisone  for treatment. I now have the same sunburn feeling on the right side of my face and I have scabs on my scalp and the area around my ear is tender to touch. Pt denies any additional sxs.

## 2024-11-15 NOTE — ED Provider Notes (Signed)
 [name]    [name]: [id number] Arrival date & time: 11/15/24  9061      History   Chief Complaint No chief complaint on file.   HPI Emily Graham is a 63 y.o. female.   Patient presents today due to vesicular lesions of right upper eyelid, right side of forehead, and scalp that she noticed yesterday.  Patient was here on 12/1 due to burning sensation of skin in this area.  Patient was started on steroids at that time.  Patient's daughter noticed the fasciculations and told her that she may have shingles and she should come back to the doctor.  Patient denies changes in vision in right eye.  The history is provided by the patient.    Past Medical History:  Diagnosis Date   ADD (attention deficit disorder) 07/19/2013   Anal fissure    Anemia    Arthritis    Asthma    Cholecystitis, acute with cholelithiasis 07/19/2013   Chronic left shoulder pain 07/19/2013   Colon polyps    Complication of anesthesia    pt aspirated during leg lift surgery.   Dyspnea    Gallstones    GERD (gastroesophageal reflux disease) 07/19/2013   Goiter    HTN (hypertension)    Hypoglycemia    Hypoglycemia 07/19/2013   Murmur    Neuromuscular disorder (HCC)    Pneumonia    Pre-diabetes    Seizure disorder, primary generalized (HCC) 07/19/2013   May be related to hypoglycemia   Seizures (HCC) last 01/08/18   2013-grand mal due to hypoglycemia   Sleep apnea    mild-not using CPAP per pt    Patient Active Problem List   Diagnosis Date Noted   Carpal tunnel syndrome of right wrist 09/19/2024   Post-traumatic osteoarthritis, right wrist 09/19/2024   Environmental and seasonal allergies 06/02/2024   Radiculopathy of lumbar region 03/15/2024   Overweight (BMI 25.0-29.9) 03/16/2018   NASH (nonalcoholic steatohepatitis) 05/06/2015   Liver fibrosis    Abnormal liver function tests 01/29/2015   Vitamin D  deficiency 11/27/2014   Other abnormal glucose 11/27/2013   Seizure  disorder, primary generalized (HCC) 07/19/2013   GERD (gastroesophageal reflux disease) 07/19/2013   Hyperlipidemia     Past Surgical History:  Procedure Laterality Date   ABDOMINAL EXPOSURE N/A 03/15/2024   Procedure: ABDOMINAL EXPOSURE;  Surgeon: Gretta Lonni PARAS, MD;  Location: [facility] OR;  Service: Vascular;  Laterality: N/A;   ABDOMINAL HYSTERECTOMY     ANTERIOR LUMBAR FUSION N/A 03/15/2024   Procedure: ANTERIOR LUMBAR FUSION 1 LEVEL;  Surgeon: Beuford Anes, MD;  Location: [facility] OR;  Service: Orthopedics;  Laterality: N/A;  ANTERIOR LUMBAR INTERBODY FUSION LUMBAR 4- LUMBAR 5 WITH INSTRUMENTATION AND ALLOGRAFT WITH POSTERIOR SPINAL FUSION WITH INSTRUMENTATION AND ALLOGRAFT   BREAST ENHANCEMENT SURGERY     CHOLECYSTECTOMY N/A 07/20/2013   Procedure: LAPAROSCOPIC CHOLECYSTECTOMY WITH INTRAOPERATIVE CHOLANGIOGRAM;  Surgeon: Alm VEAR Angle, MD;  Location: [facility] ORS;  Service: General;  Laterality: N/A;   GASTRIC BYPASS  2000   JOINT REPLACEMENT Bilateral    knees   leg lift Bilateral 2008   had aspiration in lungs, fluid was drained per pt/this was done at outpatient center/[facility]   NOSE SURGERY     Stomach tuck     TOTAL SHOULDER REPLACEMENT Right    WRIST FUSION Right    with carpal tunnel repair    OB History     Gravida  3   Para  2   Term  2  Preterm      AB  1   Living         SAB  1   IAB      Ectopic      Multiple      Live Births               Home Medications    Prior to Admission medications   Medication Sig Start Date End Date Taking? Authorizing Provider  predniSONE  (DELTASONE ) 20 MG tablet Take 2 tablets (40 mg total) by mouth daily with breakfast for 5 days. 11/13/24 11/18/24 Yes Garrison, [place]  N, FNP  valACYclovir (VALTREX) 1000 MG tablet Take 1 tablet (1,000 mg total) by mouth 3 (three) times daily for 7 days. 11/15/24 11/22/24 Yes Andra Corean BROCKS, PA-C  acetaminophen  (TYLENOL ) 650 MG CR tablet Take 1,300 mg by mouth every 8 (eight)  hours as needed for pain.    [provider]  albuterol  (VENTOLIN  HFA) 108 (90 Base) MCG/ACT inhaler Inhale 1-2 puffs into the lungs every 6 (six) hours as needed for wheezing or shortness of breath. 06/02/24   Henson, Vickie L, NP-C  APPLE CIDER VINEGAR PO Take 2 tablets by mouth daily.    [provider]  aspirin EC 81 MG tablet Take 81 mg by mouth daily. Swallow whole.    [provider]  b complex vitamins tablet Take 1 tablet by mouth daily.    [provider]  Brimonidine  Tartrate (LUMIFY ) 0.025 % SOLN Place 1 drop into both eyes daily as needed (redness/irritation).    [provider]  EPINEPHrine  0.3 mg/0.3 mL IJ SOAJ injection Inject 0.3 mg into the muscle as needed for anaphylaxis. 06/02/24   Henson, Vickie L, NP-C  gabapentin  (NEURONTIN ) 600 MG tablet Take 1 tablet (600 mg total) by mouth at bedtime. 06/02/24   Henson, Vickie L, NP-C  IRON , FERROUS SULFATE , PO Take 27 mg by mouth daily.    [provider]  levETIRAcetam  (KEPPRA ) 500 MG tablet Take 1 tablet (500 mg total) by mouth 2 (two) times daily. 09/19/24   Camara, Amadou, MD  loratadine  (CLARITIN ) 10 MG tablet Take 10 mg by mouth daily.    [provider]  Midazolam  (NAYZILAM ) 5 MG/0.1ML SOLN Place 5 mg into the nose as needed (for seizure clusters or seizure lasting more than 2 minutes). Patient not taking: Reported on 11/13/2024 09/29/23   Camara, Amadou, MD  Milk Thistle 1000 MG CAPS Take 1,000 mg by mouth daily.    [provider]  Multiple Vitamin (MULTIVITAMIN) tablet Take 1 tablet by mouth daily.     [provider]  Multiple Vitamins-Minerals (HAIR SKIN AND NAILS FORMULA PO) Take 1 tablet by mouth daily. 08/02/19   [provider]  Omega-3 Fatty Acids (FISH OIL) 1000 MG CAPS Take 1 capsule by mouth daily.    [provider]  omeprazole  (PRILOSEC) 20 MG capsule Take 1 capsule (20 mg total) by mouth daily. 07/26/24   Henson, Vickie L,  NP-C  psyllium (REGULOID) 0.52 g capsule Take 0.52 g by mouth daily.    [provider]  TURMERIC PO Take 1,500 mg by mouth daily.    [provider]    Family History Family History  Problem Relation Age of Onset   Stroke Mother    Skin cancer Mother    Hypertension Mother    Hyperlipidemia Mother    Kidney disease Mother    Coronary artery disease Father  MI at age 5   Heart disease Father    Hypertension Father    Diabetes Father    Hypertension Brother    Hyperlipidemia Brother    Uterine cancer Maternal Grandmother    Arthritis Daughter    Thyroid  disease Daughter    Obesity Son    Breast cancer Maternal Aunt    Cancer Maternal Aunt    Breast cancer Paternal Aunt    Breast cancer Paternal Uncle    Cancer Cousin    Colon cancer Neg Hx     Social History Social History   Tobacco Use   Smoking status: Never    Passive exposure: Never   Smokeless tobacco: Never  Vaping Use   Vaping status: Never Used  Substance Use Topics   Alcohol use: Yes    Alcohol/week: 2.0 standard drinks of alcohol    Types: 1 Glasses of wine, 1 Cans of beer per week    Comment: occ   Drug use: No     Allergies   Latex   Review of Systems Review of Systems   Physical Exam Triage Vital Signs ED Triage Vitals  Encounter Vitals Group     BP 11/15/24 1056 130/81     Girls Systolic BP Percentile --      Girls Diastolic BP Percentile --      Boys Systolic BP Percentile --      Boys Diastolic BP Percentile --      Pulse Rate 11/15/24 1056 80     Resp 11/15/24 1056 18     Temp 11/15/24 1056 98.1 F (36.7 C)     Temp Source 11/15/24 1056 Oral     SpO2 11/15/24 1056 97 %     Weight 11/15/24 1055 141 lb 1.5 oz (64 kg)     Height --      Head Circumference --      Peak Flow --      Pain Score 11/15/24 1054 8     Pain Loc --      Pain Education --      Exclude from Growth Chart --    No data found.  Updated Vital Signs BP 130/81 (BP Location: Left  Arm)   Pulse 80   Temp 98.1 F (36.7 C) (Oral)   Resp 18   Wt 141 lb 1.5 oz (64 kg)   SpO2 97%   BMI 27.56 kg/m   Visual Acuity Right Eye Distance:   Left Eye Distance:   Bilateral Distance:    Right Eye Near:   Left Eye Near:    Bilateral Near:     Physical Exam Vitals and nursing note reviewed.  Constitutional:      General: She is not in acute distress.    Appearance: Normal appearance. She is not ill-appearing, toxic-appearing or diaphoretic.  Eyes:     General: No scleral icterus.    Extraocular Movements: Extraocular movements intact.     Pupils: Pupils are equal, round, and reactive to light.     Comments: Moderate edema noted of right upper eyelid, within dendritic lesions noted right eye on fluorescein stain  Cardiovascular:     Rate and Rhythm: Normal rate and regular rhythm.     Heart sounds: Normal heart sounds.  Pulmonary:     Effort: Pulmonary effort is normal. No respiratory distress.     Breath sounds: Normal breath sounds. No wheezing or rhonchi.  Skin:    General: Skin is warm.  Comments: Vesicular lesions noted to face (see picture) mildly difficult to visualize due to make-up covering lesions  Neurological:     Mental Status: She is alert and oriented to person, place, and time.  Psychiatric:        Mood and Affect: Mood normal.        Behavior: Behavior normal.      UC Treatments / Results  Labs (all labs ordered are listed, but only abnormal results are displayed) Labs Reviewed - No data to display  EKG   Radiology No results found.  Procedures Procedures (including critical care time)  Medications Ordered in UC Medications - No data to display  Initial Impression / Assessment and Plan / UC Course  I have reviewed the triage vital signs and the nursing notes.  Pertinent labs & imaging results that were available during my care of the patient were reviewed by me and considered in my medical decision making (see chart for  details).   Final Clinical Impressions(s) / UC Diagnoses   Final diagnoses:  Herpes zoster without complication     Discharge Instructions      If you experience changes to vision we need to report to ED immediately.     ED Prescriptions     Medication Sig Dispense Auth. Provider   valACYclovir (VALTREX) 1000 MG tablet Take 1 tablet (1,000 mg total) by mouth 3 (three) times daily for 7 days. 21 tablet Andra Corean BROCKS, PA-C      PDMP not reviewed this encounter.   Andra Corean BROCKS, PA-C 11/15/24 1223

## 2024-11-15 NOTE — Discharge Instructions (Signed)
 If you experience changes to vision we need to report to ED immediately.

## 2024-11-16 ENCOUNTER — Encounter: Payer: Self-pay | Admitting: Neurology

## 2024-11-16 NOTE — Telephone Encounter (Signed)
 HST ordered.

## 2024-11-16 NOTE — Telephone Encounter (Signed)
 [employer] denied the [employer]PSG see below for the denial.  Do you want to order a HST?

## 2024-11-16 NOTE — Addendum Note (Signed)
 Addended by: Addisynn Vassell on: 11/16/2024 04:15 PM   Modules accepted: Orders

## 2024-11-20 NOTE — Telephone Encounter (Signed)
 Noted, thank you. HST Pending with [employer]

## 2024-11-23 DIAGNOSIS — M19131 Post-traumatic osteoarthritis, right wrist: Secondary | ICD-10-CM | POA: Diagnosis not present

## 2024-11-23 DIAGNOSIS — G5601 Carpal tunnel syndrome, right upper limb: Secondary | ICD-10-CM | POA: Diagnosis not present

## 2024-11-23 NOTE — Telephone Encounter (Signed)
 Checked status on the portal for the HST it is still pending.

## 2024-11-27 NOTE — Telephone Encounter (Signed)
 Checked the status of the HST on the portal it is still pending.

## 2024-11-30 NOTE — Telephone Encounter (Signed)
 Checked status on the portal it is still pending

## 2024-12-05 NOTE — Telephone Encounter (Signed)
 HST [employer] shara: [phone] (exp. 11/20/24 to 01/18/25)

## 2024-12-14 ENCOUNTER — Other Ambulatory Visit: Payer: Self-pay | Admitting: Family Medicine

## 2025-09-20 ENCOUNTER — Ambulatory Visit: Admitting: Neurology
